# Patient Record
Sex: Male | Born: 1955 | Race: White | Hispanic: No | Marital: Married | Smoking: Never smoker
Health system: Southern US, Community
[De-identification: ages and names within clinical notes are randomized; demographics above are authoritative.]

## PROBLEM LIST (undated history)

## (undated) DIAGNOSIS — F329 Major depressive disorder, single episode, unspecified: Secondary | ICD-10-CM

## (undated) DIAGNOSIS — I10 Essential (primary) hypertension: Secondary | ICD-10-CM

## (undated) DIAGNOSIS — F32A Depression, unspecified: Secondary | ICD-10-CM

## (undated) DIAGNOSIS — F1010 Alcohol abuse, uncomplicated: Secondary | ICD-10-CM

## (undated) HISTORY — PX: BACK SURGERY: SHX140

## (undated) HISTORY — PX: OTHER SURGICAL HISTORY: SHX169

---

## 1997-11-02 ENCOUNTER — Ambulatory Visit (HOSPITAL_COMMUNITY): Admission: RE | Admit: 1997-11-02 | Discharge: 1997-11-02 | Payer: Self-pay | Admitting: Specialist

## 1997-11-24 ENCOUNTER — Ambulatory Visit (HOSPITAL_COMMUNITY): Admission: RE | Admit: 1997-11-24 | Discharge: 1997-11-24 | Payer: Self-pay | Admitting: Specialist

## 1997-12-02 ENCOUNTER — Ambulatory Visit (HOSPITAL_COMMUNITY): Admission: RE | Admit: 1997-12-02 | Discharge: 1997-12-02 | Payer: Self-pay | Admitting: Specialist

## 1998-09-14 ENCOUNTER — Ambulatory Visit (HOSPITAL_COMMUNITY): Admission: RE | Admit: 1998-09-14 | Discharge: 1998-09-14 | Payer: Self-pay | Admitting: Orthopedic Surgery

## 2003-12-30 ENCOUNTER — Ambulatory Visit (HOSPITAL_COMMUNITY): Admission: RE | Admit: 2003-12-30 | Discharge: 2003-12-30 | Payer: Self-pay | Admitting: Gastroenterology

## 2003-12-30 DIAGNOSIS — K222 Esophageal obstruction: Secondary | ICD-10-CM

## 2003-12-30 DIAGNOSIS — K209 Esophagitis, unspecified without bleeding: Secondary | ICD-10-CM | POA: Insufficient documentation

## 2004-02-23 ENCOUNTER — Ambulatory Visit (HOSPITAL_COMMUNITY): Admission: RE | Admit: 2004-02-23 | Discharge: 2004-02-23 | Payer: Self-pay | Admitting: Critical Care Medicine

## 2007-03-23 ENCOUNTER — Ambulatory Visit: Payer: Self-pay | Admitting: Gastroenterology

## 2007-07-01 ENCOUNTER — Encounter: Payer: Self-pay | Admitting: Gastroenterology

## 2007-07-01 DIAGNOSIS — I10 Essential (primary) hypertension: Secondary | ICD-10-CM | POA: Insufficient documentation

## 2007-07-01 DIAGNOSIS — E119 Type 2 diabetes mellitus without complications: Secondary | ICD-10-CM

## 2007-07-01 DIAGNOSIS — E1149 Type 2 diabetes mellitus with other diabetic neurological complication: Secondary | ICD-10-CM | POA: Insufficient documentation

## 2008-02-05 ENCOUNTER — Observation Stay (HOSPITAL_COMMUNITY): Admission: AD | Admit: 2008-02-05 | Discharge: 2008-02-09 | Payer: Self-pay | Admitting: Emergency Medicine

## 2008-02-29 ENCOUNTER — Inpatient Hospital Stay (HOSPITAL_COMMUNITY): Admission: EM | Admit: 2008-02-29 | Discharge: 2008-03-25 | Payer: Self-pay | Admitting: Emergency Medicine

## 2008-03-02 ENCOUNTER — Ambulatory Visit: Payer: Self-pay | Admitting: Internal Medicine

## 2008-03-05 ENCOUNTER — Encounter: Payer: Self-pay | Admitting: Internal Medicine

## 2008-03-09 ENCOUNTER — Ambulatory Visit: Payer: Self-pay | Admitting: Physical Medicine & Rehabilitation

## 2008-04-11 ENCOUNTER — Ambulatory Visit: Payer: Self-pay | Admitting: Gastroenterology

## 2008-04-11 DIAGNOSIS — F329 Major depressive disorder, single episode, unspecified: Secondary | ICD-10-CM

## 2008-04-11 DIAGNOSIS — K7290 Hepatic failure, unspecified without coma: Secondary | ICD-10-CM | POA: Insufficient documentation

## 2008-04-11 DIAGNOSIS — F1021 Alcohol dependence, in remission: Secondary | ICD-10-CM

## 2008-04-11 DIAGNOSIS — K7010 Alcoholic hepatitis without ascites: Secondary | ICD-10-CM

## 2008-04-11 DIAGNOSIS — K7682 Hepatic encephalopathy: Secondary | ICD-10-CM | POA: Insufficient documentation

## 2008-05-07 ENCOUNTER — Encounter (INDEPENDENT_AMBULATORY_CARE_PROVIDER_SITE_OTHER): Payer: Self-pay | Admitting: Emergency Medicine

## 2008-05-07 ENCOUNTER — Emergency Department (HOSPITAL_COMMUNITY): Admission: EM | Admit: 2008-05-07 | Discharge: 2008-05-07 | Payer: Self-pay | Admitting: Emergency Medicine

## 2008-05-09 ENCOUNTER — Ambulatory Visit: Payer: Self-pay | Admitting: Professional

## 2008-05-11 ENCOUNTER — Encounter: Payer: Self-pay | Admitting: Gastroenterology

## 2008-05-17 ENCOUNTER — Telehealth: Payer: Self-pay | Admitting: Gastroenterology

## 2008-05-18 ENCOUNTER — Ambulatory Visit: Payer: Self-pay | Admitting: Gastroenterology

## 2008-05-23 ENCOUNTER — Ambulatory Visit: Payer: Self-pay | Admitting: Professional

## 2008-06-08 ENCOUNTER — Ambulatory Visit: Payer: Self-pay | Admitting: Radiology

## 2008-06-08 ENCOUNTER — Inpatient Hospital Stay (HOSPITAL_COMMUNITY): Admission: AD | Admit: 2008-06-08 | Discharge: 2008-06-13 | Payer: Self-pay | Admitting: Internal Medicine

## 2008-06-08 ENCOUNTER — Encounter: Payer: Self-pay | Admitting: Emergency Medicine

## 2009-02-19 ENCOUNTER — Emergency Department (HOSPITAL_BASED_OUTPATIENT_CLINIC_OR_DEPARTMENT_OTHER): Admission: EM | Admit: 2009-02-19 | Discharge: 2009-02-20 | Payer: Self-pay | Admitting: Emergency Medicine

## 2009-02-20 ENCOUNTER — Ambulatory Visit: Payer: Self-pay | Admitting: Psychiatry

## 2009-02-20 ENCOUNTER — Inpatient Hospital Stay (HOSPITAL_COMMUNITY): Admission: AD | Admit: 2009-02-20 | Discharge: 2009-02-24 | Payer: Self-pay | Admitting: Psychiatry

## 2009-02-26 IMAGING — US US ABDOMEN COMPLETE
1 series · 14 of 25 positions shown · non-contrast
Comparison: 02/05/2008

CLINICAL DATA: Alcoholic hepatitis and renal failure.  Left upper
quadrant pain.  Cirrhosis.

ABDOMEN ULTRASOUND
TECHNIQUE: Complete abdominal ultrasound examination was performed
including evaluation of the liver, gallbladder, bile ducts,
pancreas, kidneys, spleen, IVC, and abdominal aorta.

[Series 1: unknown · 0.30mm/px · 14 of 63 slices shown]
[im 1/63]
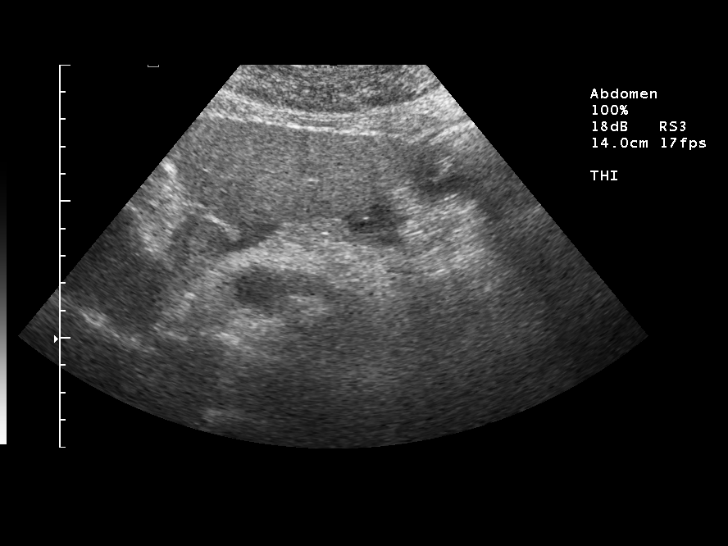
[im 6/63]
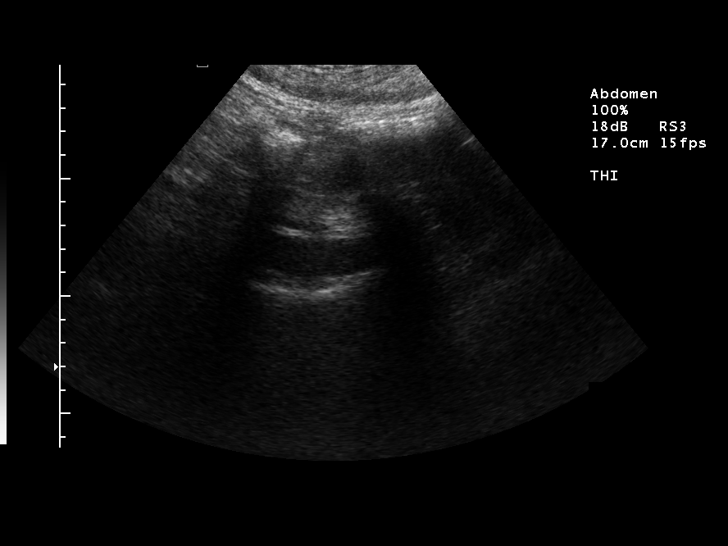
[im 11/63]
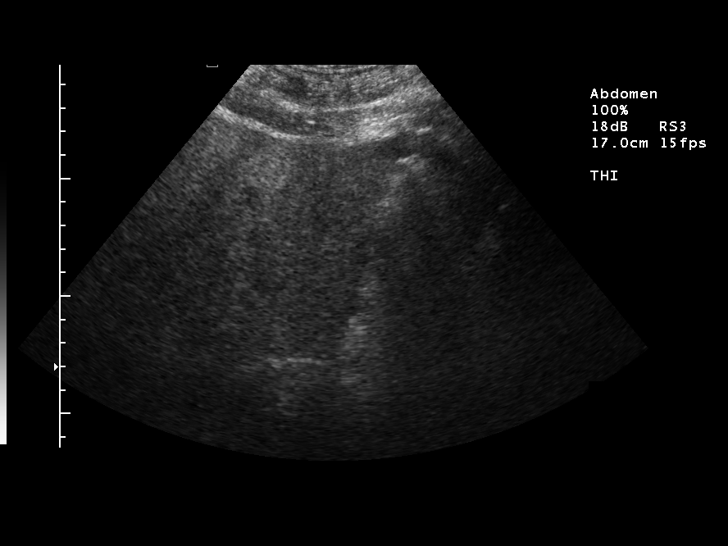
[im 16/63]
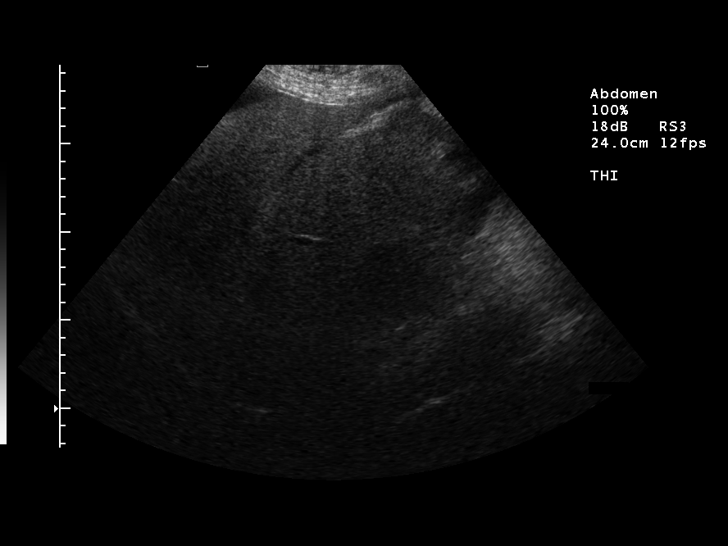
[im 21/63]
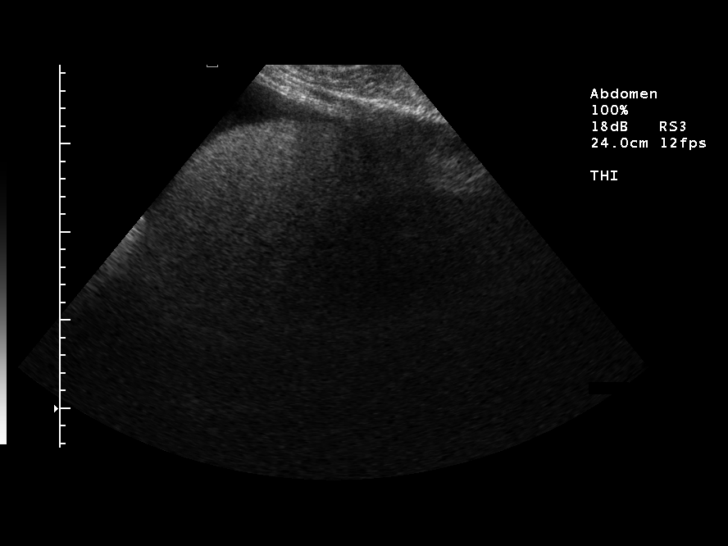
[im 24/63]
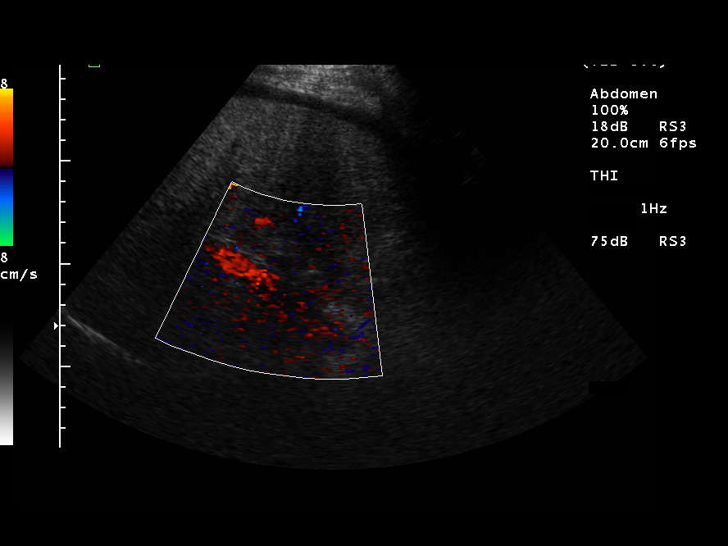
[im 29/63]
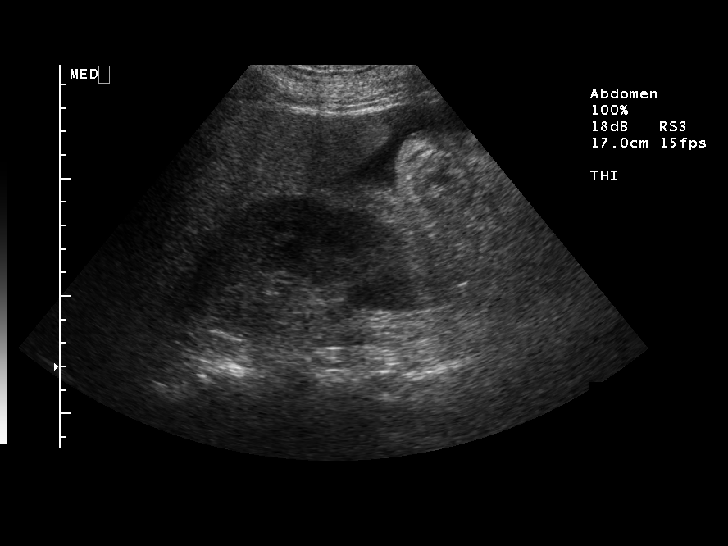
[im 34/63]
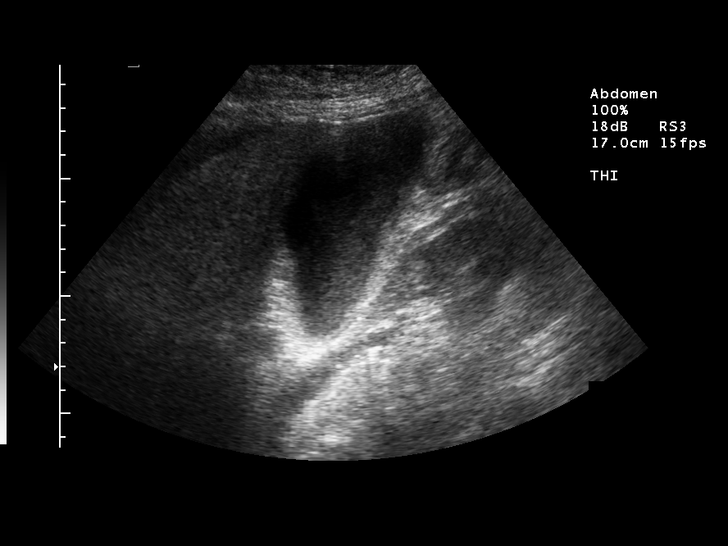
[im 39/63]
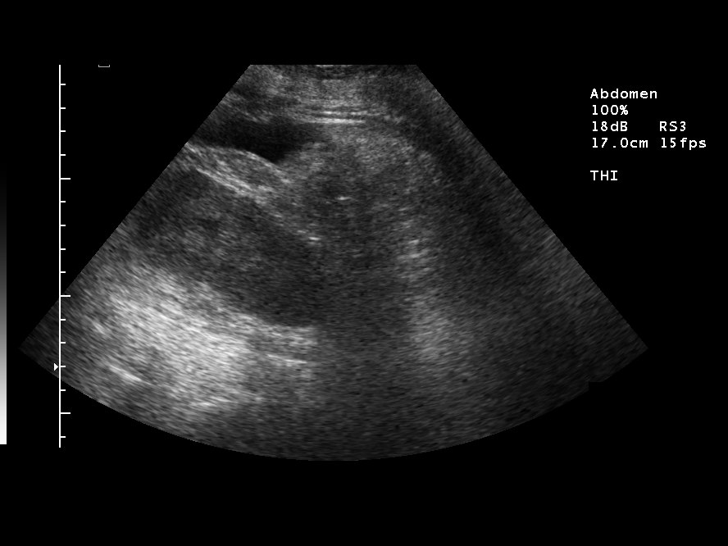
[im 42/63]
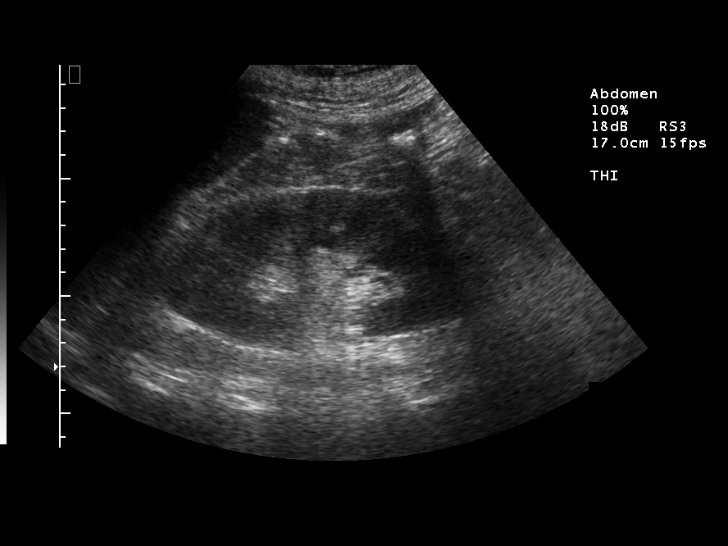
[im 47/63]
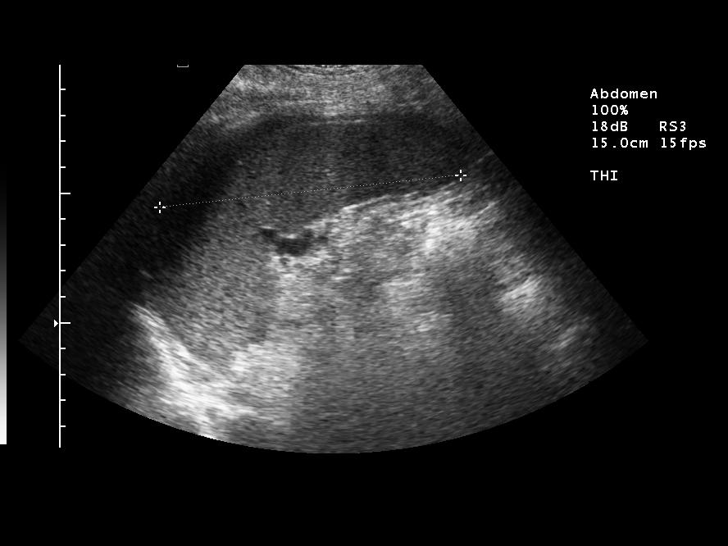
[im 52/63]
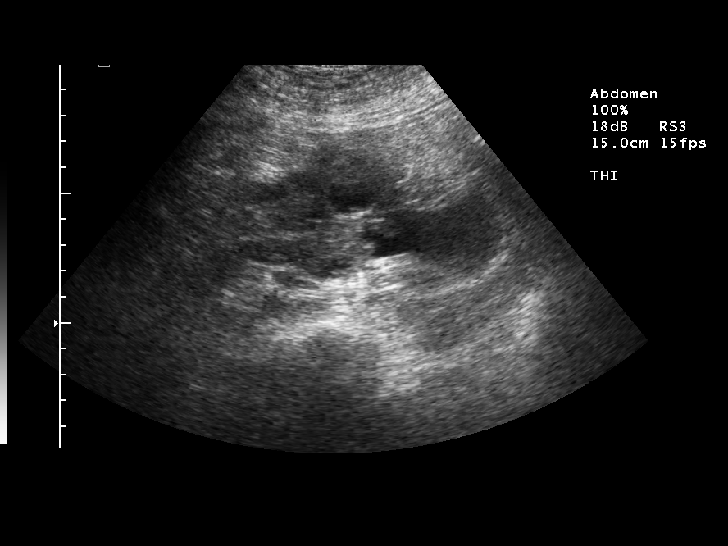
[im 57/63]
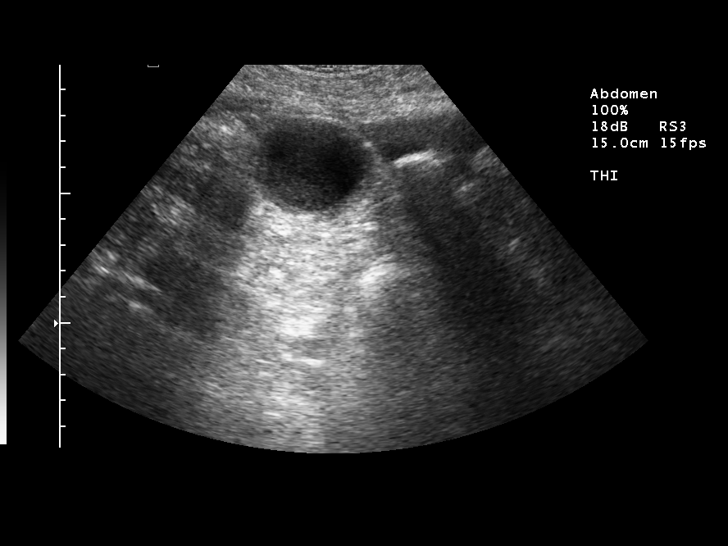
[im 63/63]
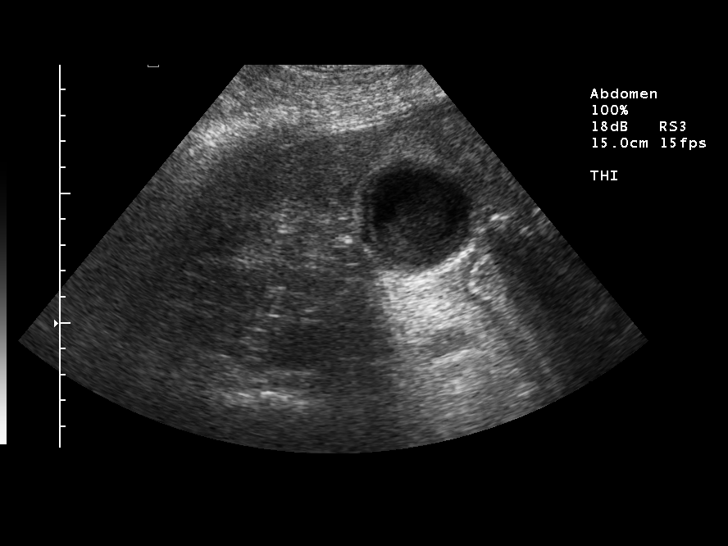

[14 of 25 positions shown; findings below may reference images not displayed]

FINDINGS: Liver is echogenic and is enlarged.  Gallbladder is
filled with nonshadowing echogenic material.  Some pericholecystic
fluid is seen.  Gallbladder wall measures 10 mm.  No sonographic
Murphy's sign.  Extrahepatic bile duct measures 5 mm.

IVC and pancreas are unremarkable.  Spleen measures 11.7 cm.
Kidneys and aorta are unremarkable.  There is ascites.
IMPRESSION: 1.  Fatty enlarged liver.
2.  Gallbladder sludge.  Wall thickening may relate to ascites.

## 2009-03-01 IMAGING — CR DG CHEST 1V PORT
1 series · 1 of 1 positions shown · non-contrast
Comparison: 02/29/2008

CLINICAL DATA: Renal failure.  Rule out pneumonia.

PORTABLE CHEST - 1 VIEW

[view not recorded]
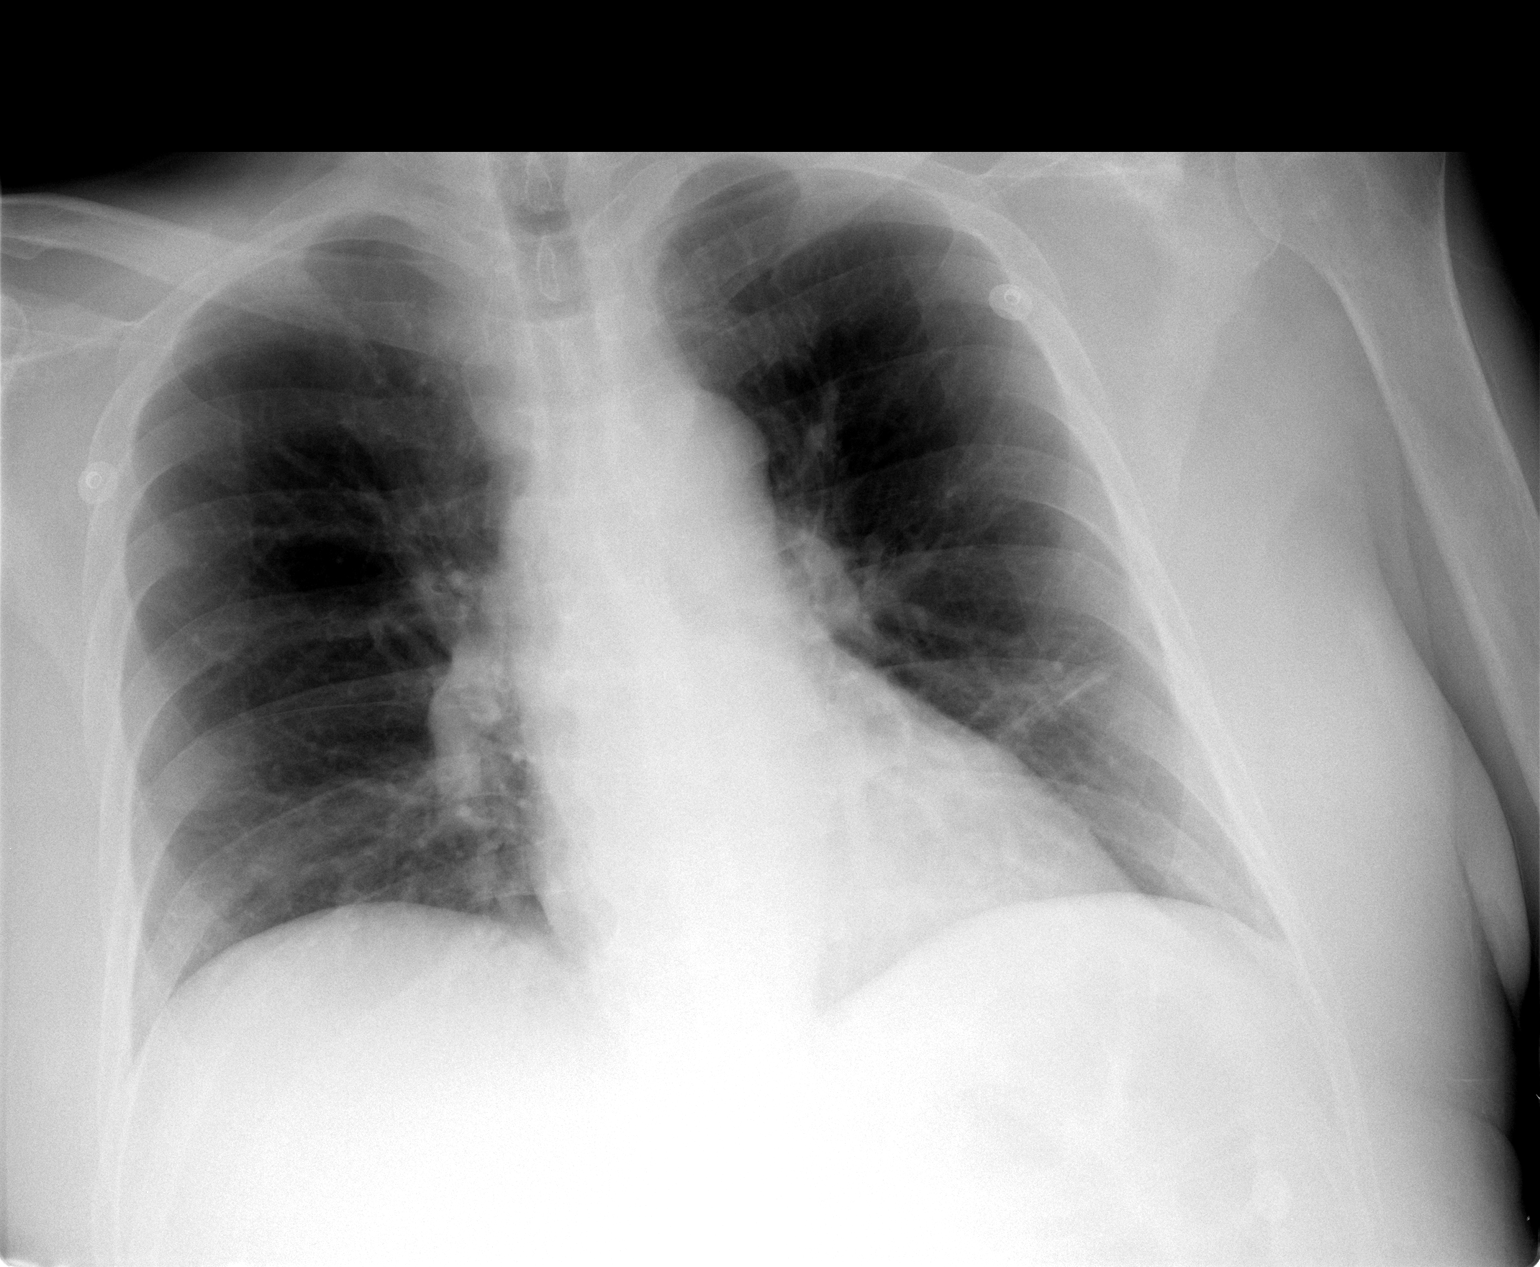

[1 of 1 positions shown; findings below may reference images not displayed]

FINDINGS: There is no edema or infiltrate.  Small linear band of
atelectasis is present in the left base.  There is no effusion.
IMPRESSION: Negative for pneumonia.

## 2009-03-07 IMAGING — US US ABDOMEN LIMITED
1 series · 4 of 4 positions shown · non-contrast
Comparison: none

CLINICAL DATA: Leukocytosis, alcoholic hepatitis, anemia, failure
to thrive; request is made for paracentesis

ABDOMEN ULTRASOUND LIMITED

[Series 1: paracentesis · 0.37mm/px · 4 of 4 slices shown]
[im 1/4]
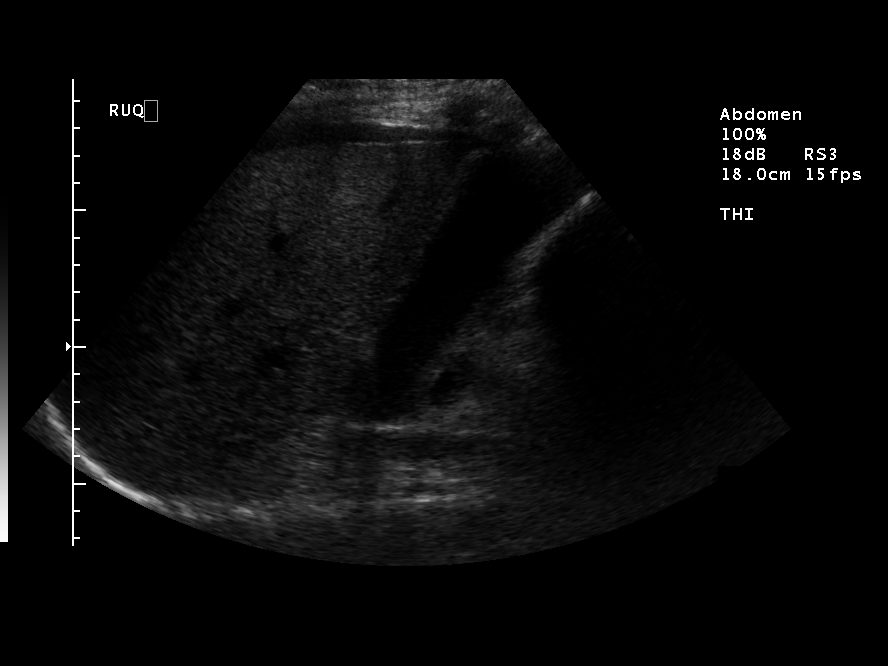
[im 2/4]
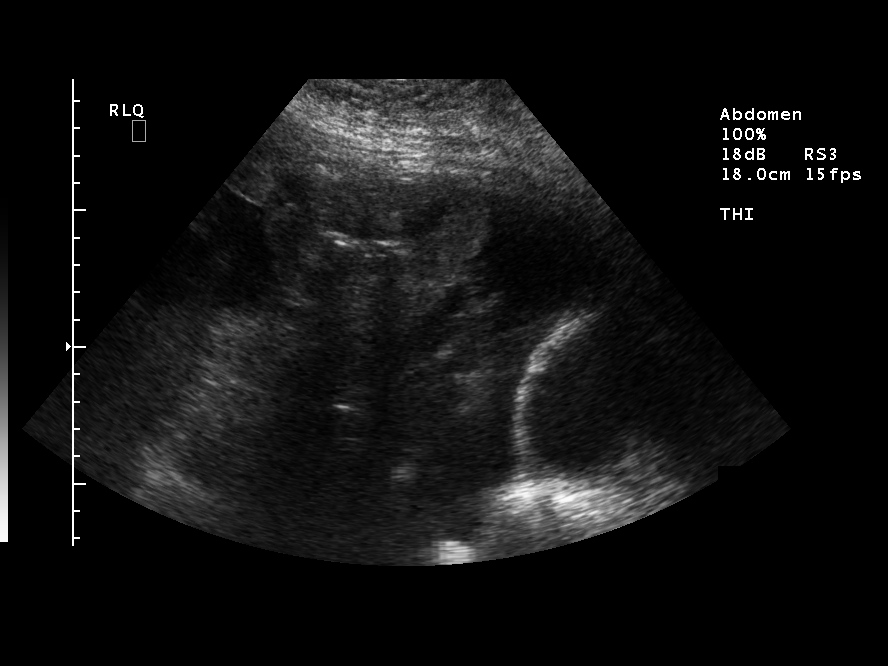
[im 3/4]
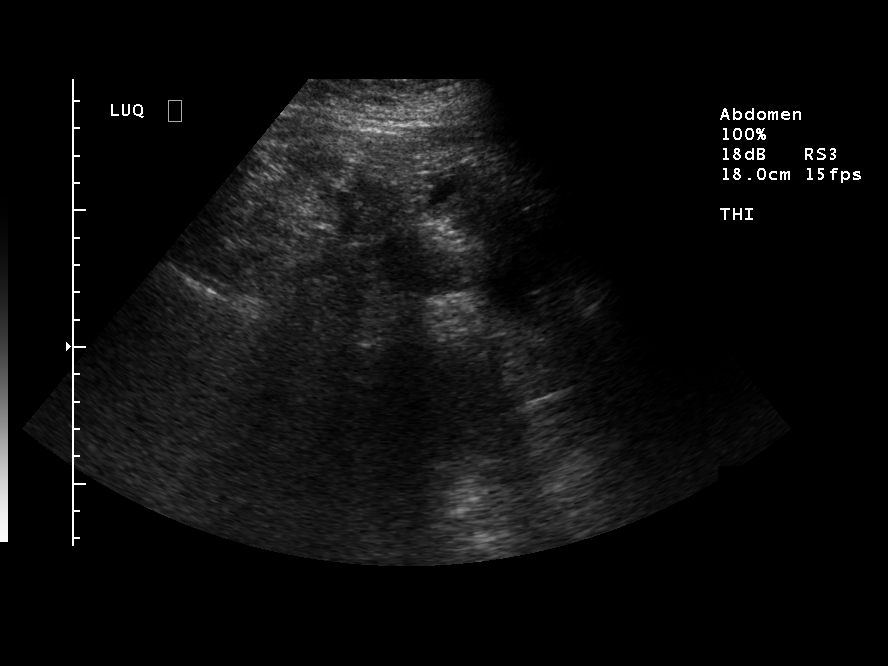
[im 4/4]
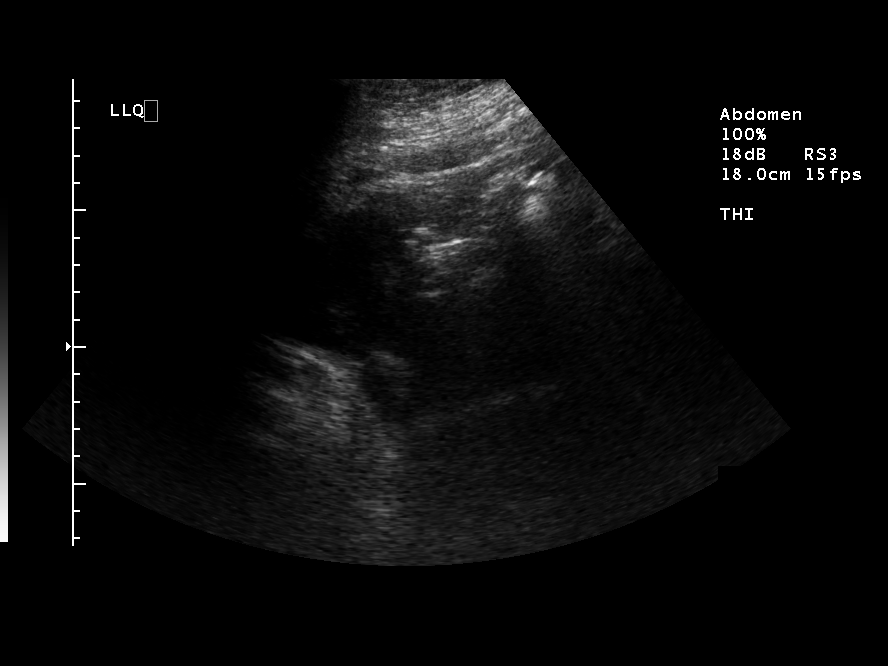

[4 of 4 positions shown; findings below may reference images not displayed]

FINDINGS: Four-quadrant limited abdominal/pelvic ultrasound shows
only a small amount of ascitic fluid. Findings were reviewed with
Dr.Selders ( interventional radiologist) with determination that there
was insufficient fluid for paracentesis at this time without
increased risk of liver or bowel injury.
IMPRESSION: There is only a small amount of ascitic fluid seen in the
peritoneal cavity at this time.  Paracentesis was not performed.
Findings were discussed with Dr. Shinn.

Read by: Almada, Adrean.-MAXEY

## 2009-03-07 IMAGING — CR DG CHEST 1V PORT
1 series · 1 of 1 positions shown · non-contrast
Comparison: Portable chest x-ray of 03/05/2008

CLINICAL DATA: Follow up of pneumonia, urinary tract infection,
renal failure

PORTABLE CHEST - 1 VIEW

[view not recorded]
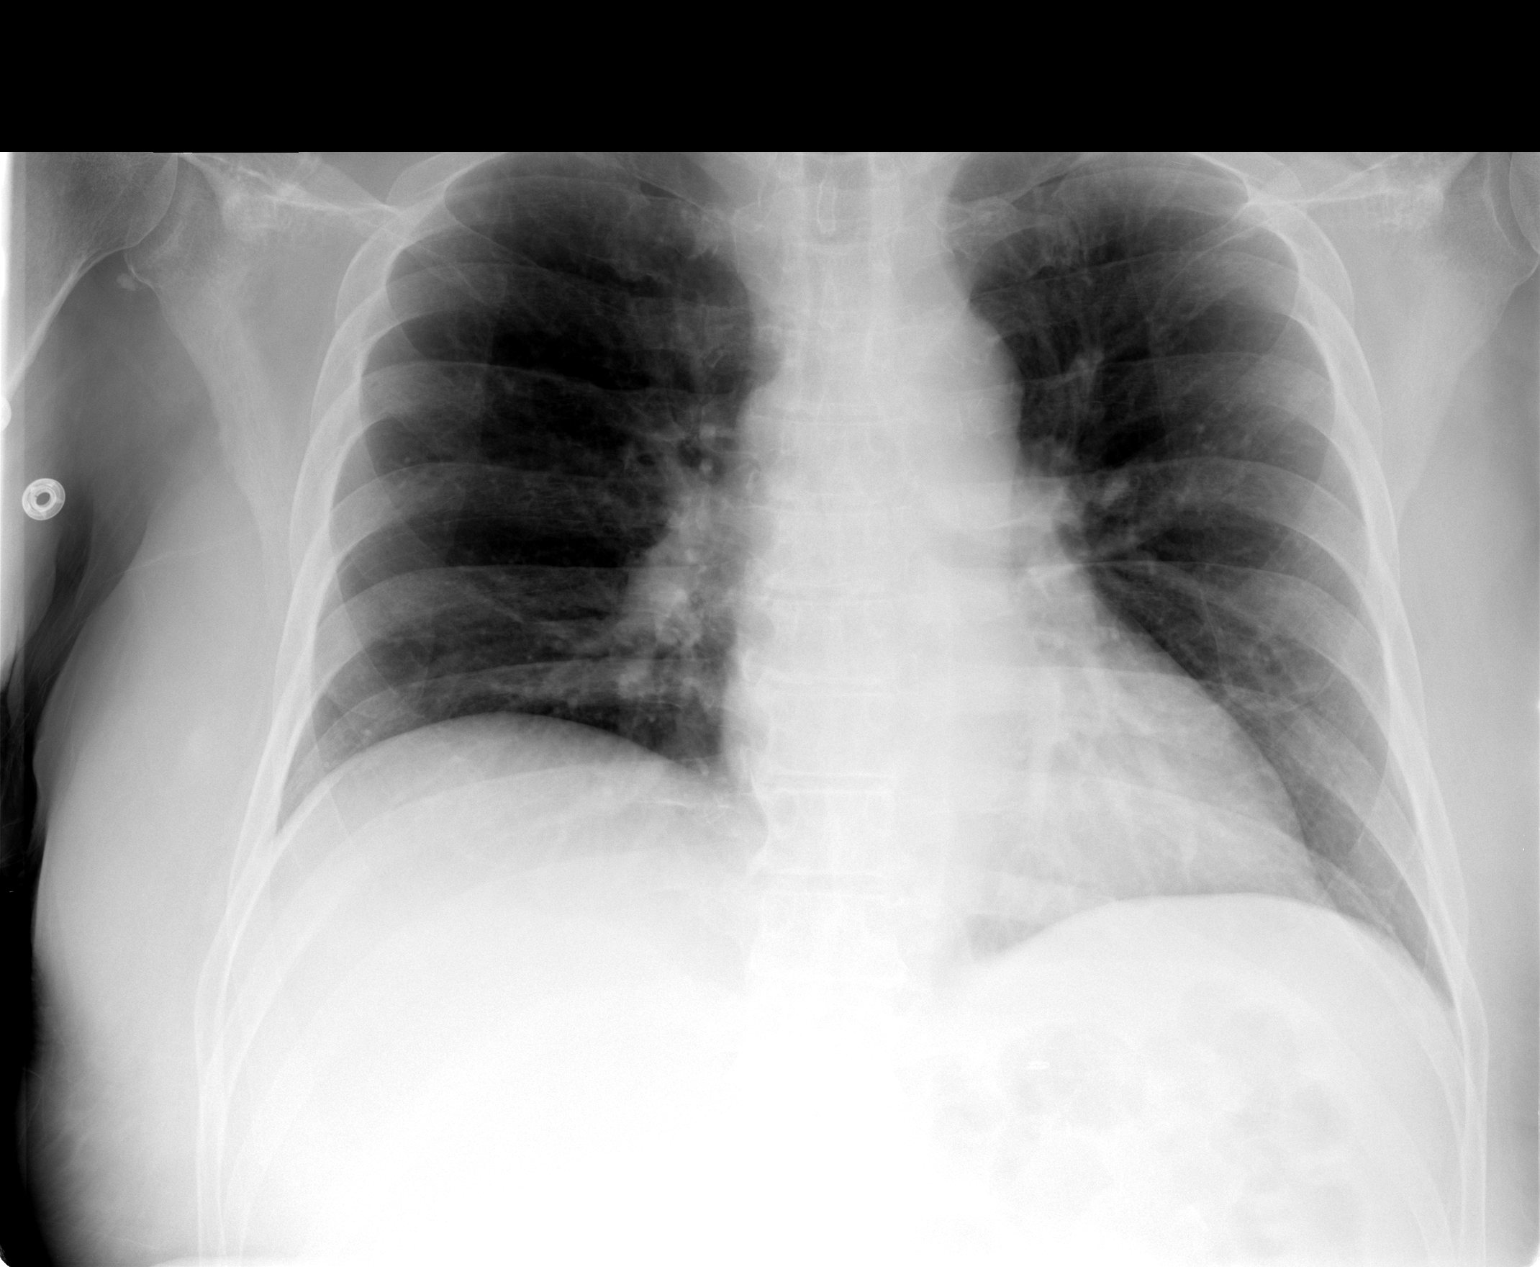

[1 of 1 positions shown; findings below may reference images not displayed]

FINDINGS: The lungs are clear. The heart is within normal limits in
size. No bony abnormality is seen.
IMPRESSION: No active lung disease.

## 2009-06-04 IMAGING — CR DG HIP (WITH OR WITHOUT PELVIS) 2-3V*L*
1 series · 1 of 1 positions shown · non-contrast
Comparison: Left femur same day

Addendum Begins

Additional three views of the pelvis are obtained.  There is
intertrochanteric fracture of the proximal right femur with varus
angulation.
Addendum Ends
CLINICAL DATA: Trauma, fall
LEFT HIP - COMPLETE 2+ VIEW

[w cross table hip left *]
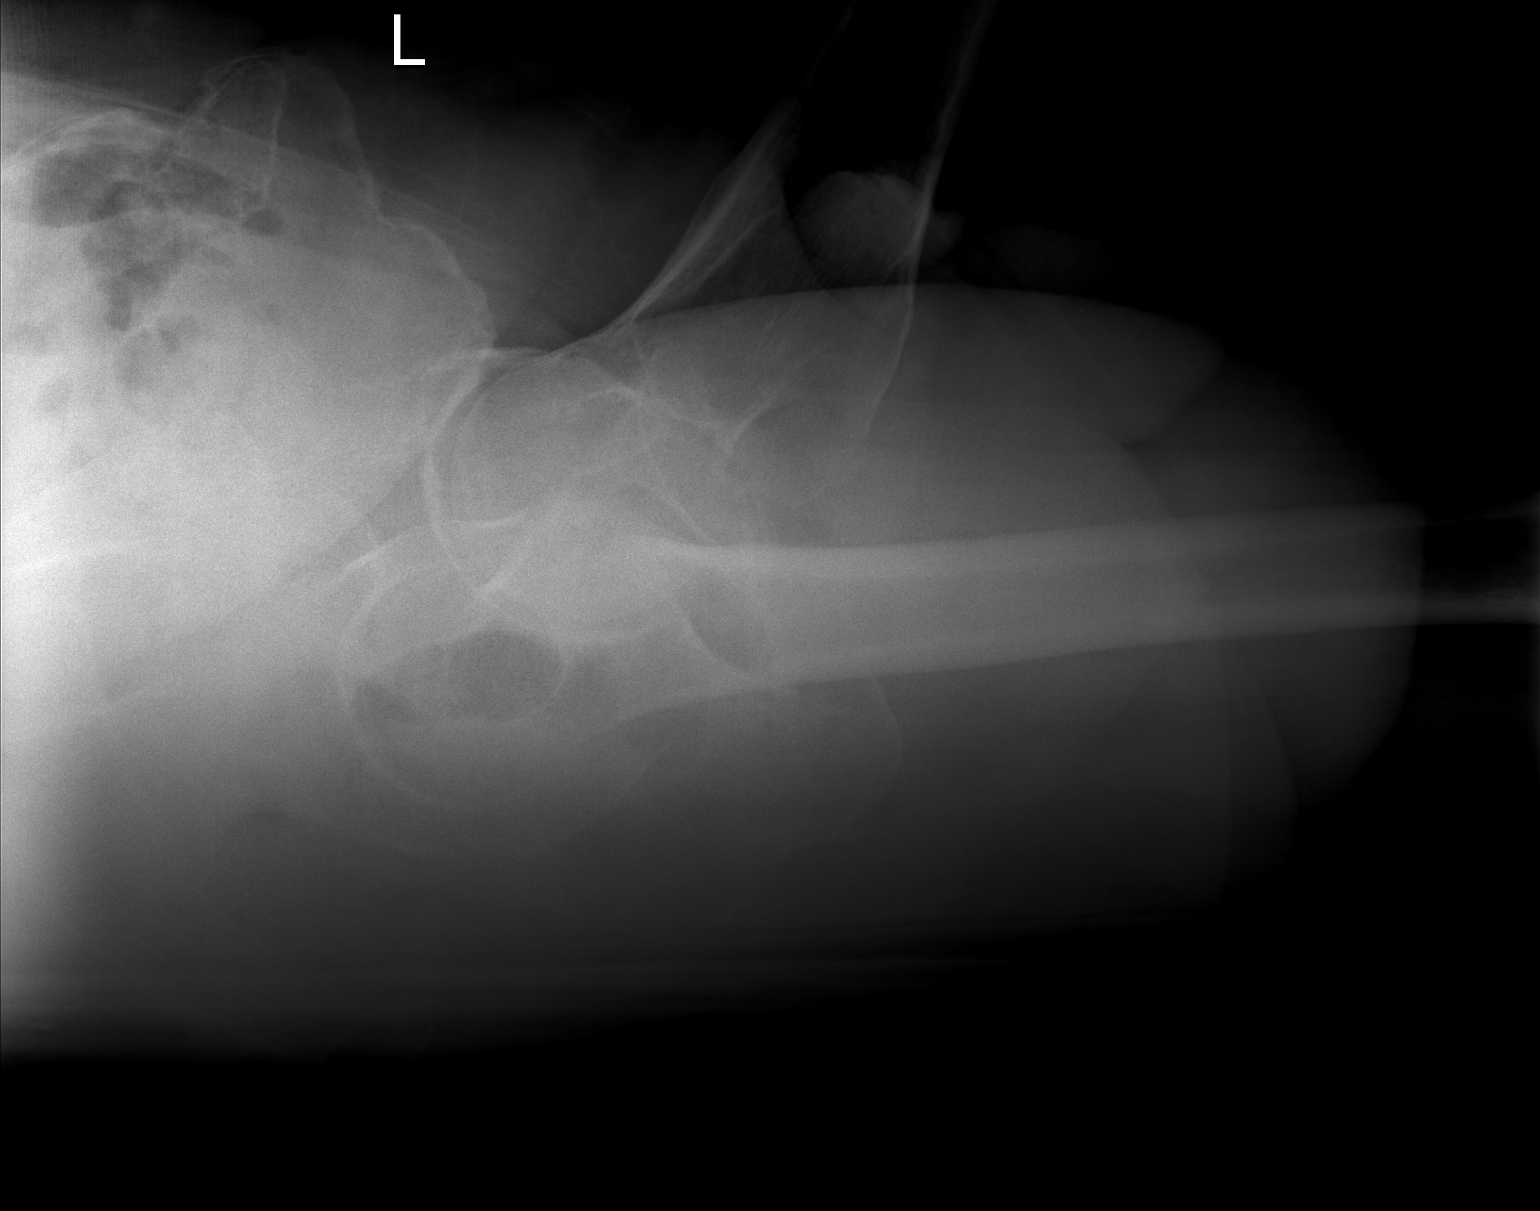

[1 of 1 positions shown; findings below may reference images not displayed]

FINDINGS: The study is markedly limited with only a single cross-
table view provided.  There is impacted fracture of the left
femoral neck.
IMPRESSION: Limited study.  Impacted fracture of the left femoral neck.

## 2009-07-31 ENCOUNTER — Encounter: Admission: RE | Admit: 2009-07-31 | Discharge: 2009-07-31 | Payer: Self-pay | Admitting: Orthopedic Surgery

## 2009-11-02 ENCOUNTER — Ambulatory Visit (HOSPITAL_COMMUNITY): Admission: RE | Admit: 2009-11-02 | Discharge: 2009-11-02 | Payer: Self-pay | Admitting: Surgery

## 2010-07-22 LAB — PROTIME-INR
INR: 1.02 (ref 0.00–1.49)
Prothrombin Time: 13.3 seconds (ref 11.6–15.2)

## 2010-07-22 LAB — CBC
HCT: 40.6 % (ref 39.0–52.0)
RBC: 4.2 MIL/uL — ABNORMAL LOW (ref 4.22–5.81)
RDW: 13.2 % (ref 11.5–15.5)
WBC: 9.4 10*3/uL (ref 4.0–10.5)

## 2010-07-22 LAB — COMPREHENSIVE METABOLIC PANEL
ALT: 27 U/L (ref 0–53)
AST: 31 U/L (ref 0–37)
Albumin: 4.3 g/dL (ref 3.5–5.2)
BUN: 18 mg/dL (ref 6–23)
GFR calc non Af Amer: >60 mL/min (ref >60)
Glucose, Bld: 106 mg/dL — ABNORMAL HIGH (ref 70–99)
Total Protein: 6.9 g/dL (ref 6.0–8.3)

## 2010-07-22 LAB — DIFFERENTIAL
Monocytes Relative: 9 % (ref 3–12)
Neutrophils Relative %: 60 % (ref 43–77)

## 2010-08-09 LAB — DIFFERENTIAL
Basophils Absolute: 0.1 10*3/uL (ref 0.0–0.1)
Basophils Relative: 1 % (ref 0–1)
Eosinophils Absolute: 0.3 10*3/uL (ref 0.0–0.7)
Eosinophils Relative: 4 % (ref 0–5)
Monocytes Absolute: 0.3 10*3/uL (ref 0.1–1.0)

## 2010-08-09 LAB — COMPREHENSIVE METABOLIC PANEL
Alkaline Phosphatase: 101 U/L (ref 39–117)
BUN: 12 mg/dL (ref 6–23)
Calcium: 9.1 mg/dL (ref 8.4–10.5)
Glucose, Bld: 177 mg/dL — ABNORMAL HIGH (ref 70–99)
Total Protein: 7.9 g/dL (ref 6.0–8.3)

## 2010-08-09 LAB — CBC
HCT: 42 % (ref 39.0–52.0)
Hemoglobin: 14.1 g/dL (ref 13.0–17.0)
MCHC: 33.7 g/dL (ref 30.0–36.0)
RDW: 13.6 % (ref 11.5–15.5)

## 2010-08-09 LAB — POCT TOXICOLOGY PANEL

## 2010-08-09 LAB — HEPATIC FUNCTION PANEL
AST: 34 U/L (ref 0–37)
Bilirubin, Direct: 0.2 mg/dL (ref 0.0–0.3)
Total Bilirubin: 1.1 mg/dL (ref 0.3–1.2)

## 2010-08-09 LAB — PROTIME-INR
INR: 0.93 (ref 0.00–1.49)
Prothrombin Time: 12.4 seconds (ref 11.6–15.2)

## 2010-08-09 LAB — SALICYLATE LEVEL: Salicylate Lvl: 1 mg/dL — ABNORMAL LOW (ref 2.8–20.0)

## 2010-08-09 LAB — AMMONIA: Ammonia: 13 umol/L (ref 11–35)

## 2010-08-20 LAB — BASIC METABOLIC PANEL
BUN: 11 mg/dL (ref 6–23)
CO2: 25 mEq/L (ref 19–32)
Chloride: 102 mEq/L (ref 96–112)
Creatinine, Ser: 0.88 mg/dL (ref 0.4–1.5)

## 2010-08-20 LAB — CSF CELL COUNT WITH DIFFERENTIAL
RBC Count, CSF: 0 /mm3
RBC Count, CSF: 76 /mm3 — ABNORMAL HIGH
Tube #: 1
WBC, CSF: 0 /mm3 (ref 0–5)

## 2010-08-20 LAB — CBC
MCHC: 34 g/dL (ref 30.0–36.0)
MCV: 92.9 fL (ref 78.0–100.0)
Platelets: 127 10*3/uL — ABNORMAL LOW (ref 150–400)
RDW: 14.2 % (ref 11.5–15.5)
WBC: 11.3 10*3/uL — ABNORMAL HIGH (ref 4.0–10.5)

## 2010-08-20 LAB — LACTIC ACID, PLASMA: Lactic Acid, Venous: 1.2 mmol/L (ref 0.5–2.2)

## 2010-08-20 LAB — PROTEIN AND GLUCOSE, CSF
Glucose, CSF: 74 mg/dL (ref 43–76)
Total  Protein, CSF: 21 mg/dL (ref 15–45)

## 2010-08-20 LAB — DIFFERENTIAL
Basophils Absolute: 0.1 10*3/uL (ref 0.0–0.1)
Basophils Relative: 1 % (ref 0–1)
Eosinophils Absolute: 0 10*3/uL (ref 0.0–0.7)
Neutrophils Relative %: 77 % (ref 43–77)

## 2010-08-20 LAB — HEPATIC FUNCTION PANEL
Albumin: 2.3 g/dL — ABNORMAL LOW (ref 3.5–5.2)
Total Protein: 5.7 g/dL — ABNORMAL LOW (ref 6.0–8.3)

## 2010-08-20 LAB — CSF CULTURE W GRAM STAIN
Culture: NO GROWTH
Gram Stain: NONE SEEN

## 2010-08-21 LAB — BASIC METABOLIC PANEL
BUN: 11 mg/dL (ref 6–23)
CO2: 24 mEq/L (ref 19–32)
CO2: 25 mEq/L (ref 19–32)
CO2: 25 mEq/L (ref 19–32)
CO2: 25 mEq/L (ref 19–32)
Calcium: 8.5 mg/dL (ref 8.4–10.5)
Calcium: 8.7 mg/dL (ref 8.4–10.5)
Calcium: 8.8 mg/dL (ref 8.4–10.5)
Calcium: 9.9 mg/dL (ref 8.4–10.5)
Chloride: 96 mEq/L (ref 96–112)
Chloride: 98 mEq/L (ref 96–112)
Creatinine, Ser: 0.8 mg/dL (ref 0.4–1.5)
Creatinine, Ser: 0.86 mg/dL (ref 0.4–1.5)
Creatinine, Ser: 0.89 mg/dL (ref 0.4–1.5)
GFR calc Af Amer: >60 mL/min (ref >60)
GFR calc Af Amer: >60 mL/min (ref >60)
GFR calc Af Amer: >60 mL/min (ref >60)
GFR calc non Af Amer: >60 mL/min (ref >60)
GFR calc non Af Amer: >60 mL/min (ref >60)
GFR calc non Af Amer: >60 mL/min (ref >60)
Glucose, Bld: 101 mg/dL — ABNORMAL HIGH (ref 70–99)
Glucose, Bld: 122 mg/dL — ABNORMAL HIGH (ref 70–99)
Sodium: 127 mEq/L — ABNORMAL LOW (ref 135–145)
Sodium: 129 mEq/L — ABNORMAL LOW (ref 135–145)
Sodium: 131 mEq/L — ABNORMAL LOW (ref 135–145)
Sodium: 133 mEq/L — ABNORMAL LOW (ref 135–145)
Sodium: 138 mEq/L (ref 135–145)

## 2010-08-21 LAB — CBC
HCT: 33.5 % — ABNORMAL LOW (ref 39.0–52.0)
Hemoglobin: 10.6 g/dL — ABNORMAL LOW (ref 13.0–17.0)
Hemoglobin: 11.1 g/dL — ABNORMAL LOW (ref 13.0–17.0)
Hemoglobin: 11.4 g/dL — ABNORMAL LOW (ref 13.0–17.0)
Hemoglobin: 11.6 g/dL — ABNORMAL LOW (ref 13.0–17.0)
Hemoglobin: 16.1 g/dL (ref 13.0–17.0)
Hemoglobin: 9.9 g/dL — ABNORMAL LOW (ref 13.0–17.0)
MCHC: 33.8 g/dL (ref 30.0–36.0)
MCHC: 34.1 g/dL (ref 30.0–36.0)
MCHC: 34.1 g/dL (ref 30.0–36.0)
MCHC: 34.4 g/dL (ref 30.0–36.0)
MCV: 91.8 fL (ref 78.0–100.0)
MCV: 91.8 fL (ref 78.0–100.0)
Platelets: 216 10*3/uL (ref 150–400)
RBC: 3.17 MIL/uL — ABNORMAL LOW (ref 4.22–5.81)
RBC: 3.63 MIL/uL — ABNORMAL LOW (ref 4.22–5.81)
RBC: 5.37 MIL/uL (ref 4.22–5.81)
RDW: 13.8 % (ref 11.5–15.5)
RDW: 14.2 % (ref 11.5–15.5)
RDW: 14.2 % (ref 11.5–15.5)
WBC: 10.8 10*3/uL — ABNORMAL HIGH (ref 4.0–10.5)
WBC: 13.8 10*3/uL — ABNORMAL HIGH (ref 4.0–10.5)
WBC: 7.9 10*3/uL (ref 4.0–10.5)

## 2010-08-21 LAB — COMPREHENSIVE METABOLIC PANEL
AST: 28 U/L (ref 0–37)
Albumin: 2.7 g/dL — ABNORMAL LOW (ref 3.5–5.2)
Alkaline Phosphatase: 105 U/L (ref 39–117)
BUN: 6 mg/dL (ref 6–23)
CO2: 27 mEq/L (ref 19–32)
Chloride: 102 mEq/L (ref 96–112)
Chloride: 104 mEq/L (ref 96–112)
Creatinine, Ser: 0.82 mg/dL (ref 0.4–1.5)
GFR calc Af Amer: >60 mL/min (ref >60)
GFR calc non Af Amer: >60 mL/min (ref >60)
Glucose, Bld: 104 mg/dL — ABNORMAL HIGH (ref 70–99)
Potassium: 3.2 mEq/L — ABNORMAL LOW (ref 3.5–5.1)
Potassium: 3.5 mEq/L (ref 3.5–5.1)
Total Bilirubin: 0.6 mg/dL (ref 0.3–1.2)
Total Bilirubin: 0.7 mg/dL (ref 0.3–1.2)

## 2010-08-21 LAB — PROTIME-INR
INR: 1.2 (ref 0.00–1.49)
Prothrombin Time: 15.7 seconds — ABNORMAL HIGH (ref 11.6–15.2)

## 2010-08-21 LAB — DIFFERENTIAL
Basophils Absolute: 0 10*3/uL (ref 0.0–0.1)
Lymphocytes Relative: 12 % (ref 12–46)
Lymphs Abs: 0.9 10*3/uL (ref 0.7–4.0)
Monocytes Absolute: 0.3 10*3/uL (ref 0.1–1.0)
Monocytes Relative: 4 % (ref 3–12)
Neutro Abs: 6.6 10*3/uL (ref 1.7–7.7)

## 2010-08-21 LAB — APTT: aPTT: 32 seconds (ref 24–37)

## 2010-08-21 LAB — URINALYSIS, ROUTINE W REFLEX MICROSCOPIC
Nitrite: NEGATIVE
Protein, ur: NEGATIVE mg/dL
Specific Gravity, Urine: 1.007 (ref 1.005–1.030)
Urobilinogen, UA: 1 mg/dL (ref 0.0–1.0)

## 2010-08-21 LAB — MAGNESIUM: Magnesium: 1.3 mg/dL — ABNORMAL LOW (ref 1.5–2.5)

## 2010-08-21 LAB — AMMONIA: Ammonia: 24 umol/L (ref 11–35)

## 2010-09-18 NOTE — H&P (Signed)
NAME:  [name], [name]                 ACCOUNT NO.:  [id number]   MEDICAL RECORD NO.:  [id number]          PATIENT TYPE:  INP   LOCATION:  3737                         FACILITY:  [facility]   PHYSICIAN:  Lonia Blood, M.D.DATE OF BIRTH:  Aug 13, 1955   DATE OF ADMISSION:  06/08/2008  DATE OF DISCHARGE:                              HISTORY & PHYSICAL   PRIMARY CARE PHYSICIAN:  Dr. Lorenz Coaster.   CHIEF COMPLAINT:  Fall with left femoral neck fracture.   PRESENT ILLNESS:  Mr. [name]. [name] is 55 year old gentleman who was  discharged from the [facility] March 25, 2008 after  a prolonged stay secondary to alcohol-related severe hepatitis and  profound failure to thrive related to the same.  Since that time the  patient vows that he has not had a sip of alcohol.  He states he has  been compliant with all of his medications.  Today he went to retrieve  the mail from the mailbox at the end of his driveway, slipped on the  ice, and fell onto his left side.  He immediately began to experience  severe pain.  Unfortunately, his cell phone slipped out of his pocket  when he fell and slid under his car.  He spent approximately 2 hours on  the ground on the ice pack while he slowly worked his way underneath his  car and was finally able to retrieve his phone and call for help.  He  was transferred [place] [facility] for evaluation where the diagnosis of  fracture was made after an x-ray was obtained.  Orthopedics was  contacted and requested that medical service admit the patient due to  his multiple chronic medical problems.  The patient was transferred to  unit 3700 where he is evaluated.   At the present time the patient is resting comfortably in his hospital  bed.  He has no complaints other than left hip pain.  He vehemently  denies any chest pain, shortness of breath, dyspnea on exertion, fevers,  chills, nausea, vomiting, night sweats, diarrhea.  He states that he has  had no  anginal type symptoms whatsoever and that he has had no  palpitations.   REVIEW OF SYSTEMS:  Comprehensive review of systems unremarkable  exception of positive elements on his present illness.   PAST MEDICAL HISTORY:  1. Extensive history of heavy alcohol abuse.      a.     Prior admission for alcoholic related hepatitis.      b.     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      c.     Complete abstinence from alcohol for 3 months.  2. Depression.  3. Prior history of obesity status post Roux-en-Y gastric bypass with      subsequent loss of 150 pounds.  4. Prior history of diabetes - resolved with weight loss.  5. Prior history of hypertension - resolved with weight loss.   OUTPATIENT MEDICATIONS:  1. Prozac 40 mg daily.  2. Zinc sulfate 220 mg daily.  3. Lactulose 50 mL b.i.d.  4. Metolazone  2.5 mg p.o. as needed on Monday, Wednesday and Friday      for fluid accumulation in legs.  5. Calcium carbonate 500 mg b.i.d.  6. Metanx 1 p.o. daily.  7. Folic acid 1 mg daily.  8. vitamin D 200 international units daily.  9. Thiamine 1 mg daily.  10.Multivitamin n.p.o. daily.  11.Magnesium oxide 400 mg daily.  12.Potassium chloride 20 mEq b.i.d.   ALLERGIES:  No known drug allergies.   FAMILY HISTORY:  Noncontributory.   SOCIAL HISTORY:  The patient is a retired Social research officer, government.  He is married.  He lives in the [place] area.  He does not smoke and as stated above  has not drunk alcohol in 3 months.   DATA REVIEW:  BMET is unremarkable.  CBC is unremarkable.  Coags are  normal with INR 1.1 and a peaked PT of 32.  X-ray of the hip reveals an  impacted fracture the left femoral neck.  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   PHYSICAL EXAMINATION:  VITAL SIGNS:  Temperature 98.4, blood pressure  149/90, heart rate 90, respiratory 18, O2 saturation 99% on room air.  GENERAL:  Well-developed,  well-nourished gentleman in no acute  respiratory distress.  HEENT: Normocephalic, atraumatic.  Pupils equal round gram  accommodation.  Extraocular intact bilaterally.  TMs clear.  NECK:  There is no JVD.  LUNGS:  Clear to auscultation bilaterally without wheezes or rhonchi.  CARDIOVASCULAR:  Regular rate and rhythm with no appreciable murmur,  gallop or rub.  Normal S1 and S2.  ABDOMEN:  Soft bowel sounds present.  There is some redundant cutaneous  tissue but no severe obesity.  There is no appreciable masses.  No  rebound, no ascites.  EXTREMITIES:  With 1+ bilateral lower extremity edema without cyanosis  or clubbing.  NEUROLOGIC:  Oriented x4.  Cranial nerves II-XII intact bilaterally, 5/5  strength bilateral upper extremities and lower extremities are not  tested secondary to the patient's fracture.  There is no Babinski.  The  patient has intact sensation judged throughout.   IMPRESSION AND PLAN:  1. Left femoral neck fracture - the patient has been evaluated by      orthopedic service and they would like to take the patient to the      OR tomorrow afternoon.  The patient has no cardiac symptoms      whatsoever to suggest unstable angina pectoris.  He has no history      of coronary artery disease.  The only factor that affects his      operative mortality will be his potential liver disease.  The      patient does have a history of coagulopathy and reported hepatitis      the only measure I do have available to me now, his coags, are      completely normal.  CMET has not yet been obtained.  I will assess      a CMET as a general measure of the patient's liver function.      However, PT and PTT are perhaps even more accurate measurements and      they are normal at the present time.  It is certainly quite      possible that the patient was suffering with alcoholic hepatitis      and not true cirrhosis, and at the liver function has now      normalized.  It is appreciated that  evaluation the patient's liver      with ultrasound in October 2009 revealed a fatty and enlarged      liver.  There was no evidence of a nodular structure to suggest      formal cirrhosis.  Assuming the patient's coags remain stable      overnight and assuming that his LFTs are actually normal or close      to normal, there is no further workup likely to effect the      patient's perioperative mortality or morbidity.  After discussion      with the patient, and assuming these lab values are not severely      deranged, I would suggest that he is presently optimized for his      necessary semi emergent surgery as scheduled tomorrow.  Should his      coags remain normal, he will need to be anticoagulated      postoperatively as per usual protocol for DVT prophylaxis.  2. History of alcohol abuse with hepatitis - as discussed above.  The      parameters available to me at the present time would suggest the      patient's liver function has actually normalized.  There is no      evidence of an active hepatitis at the present time.  We will check      coags and LFTs as discussed, but do not anticipate that this will      represent a significant issue during hospital stay.  The patient      does remain on lactulose which raises some concern that he may      continue to suffer with decreased liver function, but I will check      an ammonia level to simply evaluate this.  3. Depression - we will continue the patient's Prozac.  4. Prior history of hypertension - will follow the patient blood      pressure during his hospital stay.  It is likely that it is mildly      elevated at the present time simply secondary to pain.      Lonia Blood, M.D.  Electronically Signed     JTM/MEDQ  D:  06/08/2008  T:  06/08/2008  Job:  [id number]   cc:   Reuben Likes, M.D.

## 2010-09-18 NOTE — H&P (Signed)
NAME:  [name], [name]                 ACCOUNT NO.:  [id number]   MEDICAL RECORD NO.:  [id number]          PATIENT TYPE:  INP   LOCATION:  1424                         FACILITY:  [facility]   PHYSICIAN:  [name], M.D. DATE OF BIRTH:  12-14-1955   DATE OF ADMISSION:  02/29/2008  DATE OF DISCHARGE:                              HISTORY & PHYSICAL   PRIMARY CARE PHYSICIAN:  [name], M.D.   CHIEF COMPLAINT:  Progressive weakness and diarrhea.   HISTORY OF THE PRESENT ILLNESS:  This is a 55 year old Caucasian  gentleman who was discharged from this facility about 2-3 weeks ago  after treatment for alcoholic hepatitis at that time the patient refused  further lactulose and also was refused to go into alcohol rehab.  He was  discharged in stable condition on vitamins, potassium and magnesium  supplements, and was to continue with his Prozac.  The patient now  reports that for the past week he is been anorexic, unable to eat, and  he has been having diarrhea about every 45 minutes.  He denies any blood  in the stool, but because of progressive weakness the patient came into  the emergency room to be evaluated.   I spoke to his wife by phone who also notes that apart from the weakness  he is also having difficulty breathing with a lot of mucous production,  yellow diarrhea, abdominal pain, unable to walk, and just appears to be  going down.  She feels that the patient is now ready to cooperate with  his treatments.   PAST MEDICAL HISTORY:  1. Alcoholic hepatitis.  2. Chronic alcohol abuse.  3. Questionable cirrhosis of the liver.   MEDICATIONS:  The patient's medications as of discharge included:  1. folic acid 1 mg daily.  2. Thiamine 100 mg daily.  3. Potassium chloride 20 mEq daily.  4. Zinc sulfate 220 mg daily.  5. Magnesium oxide 100 mg three times daily with meals.  6. Prozac 40 mg daily.  7. Calcium 500 mg twice daily.  8. Multivitamin 1 daily.   ALLERGIES:  No  known drug allergies.   SOCIAL HISTORY:  The patient denies tobacco or illicit drug use.  He is  a retired Production designer, theatre/television/film at [name].  He has been drinking a little  alcohol since discharge.   FAMILY HISTORY:  The patient denies any history of addictions, heart  disease, and liver or kidney disease in any family members.   REVIEW OF SYSTEMS:  On the review of systems, other than noted above,  the 10-point review of systems is unremarkable.   PHYSICAL EXAMINATION:  GENERAL APPEARANCE:  On physical exam this is a  middle-aged Caucasian gentleman reclining in the stretcher, deeply  jaundiced and ill-looking.  VITAL SIGNS:  The patient's vital signs show his temperature is 98.0,  pulse is 115, respirations are 16, blood pressure is 118/76, and  saturating at 99% on room air.  HEENT:  Pupils are round and equal.  Mucous membranes are pink.  He is  deeply icteric.  Dehydrated.  NECK:  No cervical  lymphadenopathy or thyromegaly.  CHEST:  The patient's chest is clear to auscultation bilaterally.  HEART:  Cardiovascular system reveals regular rhythm without murmur.  ABDOMEN:  The patient's abdomen is obese and mildly distended, but soft.  There is bilateral flank dullness.  EXTREMITIES:  The patient's extremities are without edema.  He has 3+  bounding pulses bilaterally.  NEUROLOGIC EXAMINATION:  The patient's central nervous system shows  cranial nerves II-XII are grossly intact.  He has no focal neurological  signs, but he does have asterixis.   LABORATORY DATA:  The patient's Halloran count is 36,000, hemoglobin is  9.5, MCV is 105, platelets are 182,000, and he has 94% neutrophils and  large giant platelets seen.  His sodium is low at 116, his potassium is  3.4, chloride is 76, CO2 is 21, glucose is 116, BUN is 94, and  creatinine 7.56.  His total bilirubin is 12.4, his alk phos is elevated  at 404, his AST is 138, and ALT 84.  Of note, his AST and ALT are lower  than they were at  discharge; however, his alk phos is markedly  increased.  His ammonia level is normal at 34.  His lipase is normal at  26.  Urinalysis shows trace ketones, a large amount of blood, a large  amount of leukocyte esterase and he is on microscopy he has Civello cells  too numerous to count and many bacteria.  The patient's discriminant  function is calculated to be greater than 76.  The patient's x-ray shows  no acute cardiopulmonary disease.   ASSESSMENT:  1. Alcoholic hepatitis.  2. Acute renal failure.  3. Macrocytic anemia.  4. Continued alcohol abuse.  5. Urinary tract infection.  6. Failure to thrive.   PLAN:  1. We will admit this gentleman for IV fluids, IV antibiotics and      steroids.  2. We will consult the gastroenterology service, Dr. Marzetta [name] service      and [name] for assistance with management.      [name], M.D.  Electronically Signed     LC/MEDQ  D:  02/29/2008  T:  03/01/2008  Job:  [id number]   cc:   [name], M.D.  Fax: [phone]   [name] Hair. Arlyce Dice, MD,FACG  [address]  [place]  [place] [address]   [name]

## 2010-09-18 NOTE — Group Therapy Note (Signed)
NAME:  [name], [name]                 ACCOUNT NO.:  [id number]   MEDICAL RECORD NO.:  [id number]          PATIENT TYPE:  INP   LOCATION:  1513                         FACILITY:  [facility]   PHYSICIAN:  Theodosia Paling, MD    DATE OF BIRTH:  1955/08/07                                 PROGRESS NOTE   DATE OF DISCHARGE:  To be determined by the discharging physician.   For further detailed discharge summary, please refer to the excellent  discharge summary dictated by Dr. Lawrence Marseilles. [name] on March 08, 2008.  In the interim the following issues have transpired.  1. Leukocytosis.  The patient had leukocytosis.  His blood culture,      urine culture was negative.  He had a urinary tract infection when      he came to the hospital.  Currently, the patient is presumptively      on Rocephin and his leukocytosis has resolved.  The most likely      cause otherwise of the leukocytosis could be prednisone, which he      was put on for alcoholic hepatitis, which was stopped by me      considering he has peptic ulcer disease and not very strong data      and literature that supports the use of steroids in alcoholic      hepatitis.  Along with that, prednisone is not very well      metabolized and prednisolone should be the treatment choice instead      of prednisone if the need arises.  2. The next issue is alcoholic hepatitis.  The patient had transient      mild transaminitis which actually resolved.  His MELD score at this      time is 17, which is not very poor considering the best MELD score      is 6 and the worst is 40.  GI was reconsulted.  His family had a      lot of questions regarding his prognosis.  We saw the patient      yesterday and recommended the current management.  He had an      endoscopy done on March 05, 2008 which showed peptic ulcer      disease and no varices.  The patient did not have decompensation in      any form and he had minimal ascites on ultrasound, no active  GI      bleed or encephalopathy.  He will be following up with [facility]      well as [facility] in 4-6 weeks' time after discharge.  3. The next issue is acute renal failure.  When the patient arrived to      the hospital, he had severe acute renal failure, however, the      patient responded to fluids and his creatinine has been stable      since then.  4. Malnutrition.  The patient is having better feedings now than      yesterday.  5. Lethargy and generalized deconditioning.  Physical therapy has  evaluated the patient.  The patient is at this time getting      evaluated for CIR inpatient rehab.   DISPOSITION:  The patient is awaiting CIR placement at this time.   PROCEDURES PERFORMED SINCE LAST DISCHARGE SUMMARY:  None.   IMAGING PERFORMED SINCE LAST DISCHARGE SUMMARY:  Include an ultrasound of the abdomen, which showed minimal ascites.  Chest x-ray on March 11, 2008 which showed no active lung disease.   CONSULTATIONS PERFORMED:  [facility] reevaluated the patient yesterday.   TOTAL TIME SPENT:  25 minutes.      Theodosia Paling, MD  Electronically Signed     NP/MEDQ  D:  03/15/2008  T:  03/15/2008  Job:  [id number]

## 2010-09-18 NOTE — H&P (Signed)
NAME:  [name], [name]                 ACCOUNT NO.:  [id number]   MEDICAL RECORD NO.:  [id number]          PATIENT TYPE:  INP   LOCATION:  1416                         FACILITY[facility]   PHYSICIAN:  Altha Harm, MDDATE OF BIRTH:  03-31-1956   DATE OF ADMISSION:  02/05/2008  DATE OF DISCHARGE:                              HISTORY & PHYSICAL   CHIEF COMPLAINT:  Weakness, vertigo x9 months, jaundice x 3 days.   HISTORY OF PRESENT ILLNESS:  [name] is an unfortunate gentleman with  a history of alcohol use who presented to his primary care physician's  office for the first time today and was noted to be jaundiced which is  an acute problem for him.  The patient also reports gradual decline in  his function including weakness and vertigo.  He states that the  weakness has been occurring over the last 9 months.  The vertigo has  been within the last week.  The patient has become jaundiced in the last  48 hours with dizziness upon standing.  He states his urine has been  dark yellow but not coke-like in nature.  His significant other is with  him.  He reports that he has had problems with memory loss and  confusion.  The patient has no cough, no fever, no chills, no nausea,  vomiting or diarrhea.  His oral intake has been very poor.  The patient  has been drinking large amounts of alcohol on a daily basis with his  alcohol being about 3-4 liters of wine a day.  His last drink being this  morning of admission which was Mike's lemonade.  The patient is sent  over from his primary care physician's office for further evaluation.   PAST MEDICAL HISTORY:  Significant for the following:  1. Alcohol dependence.  2. Depression.  3. 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   FAMILY HISTORY:  Is noncontributory.   SOCIAL HISTORY:  The patient denies any tobacco or drug use.  However,  he does admit to alcohol use 3-4 liters of  wine per day.  The patient  has had delirium tremens in the past.  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   CURRENT MEDICATIONS:  Include Prozac 40 mg p.o. daily and assorted  vitamins.   ALLERGIES:  No known drug allergies.   PRIMARY CARE PHYSICIAN:  [name], M.D.   REVIEW OF SYSTEMS:  Fourteen systems were reviewed and all systems were  negative except as noted in the HPI.   Currently there are no labs available for this patient.   PHYSICAL EXAMINATION:  VITAL SIGNS:  Temperature is 97.9, heart rate 89,  blood pressure 122/89, respiratory rate 20, O2 sats 96% on room air.  GENERAL:  The patient is emaciated and weak appearing.  He is jaundiced  in appearance with icterus present.  HEENT:  He is normocephalic, atraumatic.  Pupils equally round and  reactive to light and accommodation.  Extraocular movements are intact.  There is conjunctival pallor and icterus present.  Oropharynx is tacky.  There is no petechiae noted.  He has no exudate, erythema or lesions  noted in the oropharynx.  NECK:  Trachea is midline.  No masses, no thyromegaly, no JVD, no  carotid bruit.  RESPIRATORY:  The patient has a kyphotic posture.  He has no wheezing or  rhonchi noted.  No rales.  Clear to auscultation.  No increased vocal  fremitus.  CARDIOVASCULAR:  He is tachycardic.  There are no murmurs, rubs or  gallops noted.  He has normal S1-S2.  PMI is nondisplaced.  No heaves or  thrills on palpation.  ABDOMEN:  Abdomen is soft, obese, nontender, nondistended.  He does have  hepatomegaly noted.  No splenomegaly is noted.  Madaline Savage space is within  normal limits.  MUSCULOSKELETAL:  He has no warmth, swelling or erythema around the  joints.  He has no CVA tenderness and no spinal tenderness noted.  LYMPH NODE SURVEY:  He has no cervical, axillary or inguinal  lymphadenopathy noted.  NEUROLOGICAL:  The patient has some ataxic gait.  His strength is  diminished equally in bilateral  lower and bilateral upper extremities  and is about 4-/5 in bilateral upper and lower extremities.  DTRs are 2+  bilaterally upper and lower extremities.  Sensation is intact to light  touch and proprioception.  PSYCHIATRIC:  The patient is alert and oriented to person, time and  place.  He has some impairment of cognition.  His insight is poor.  He  has loss of recent and remote recall.  The patient does not demonstrate  any evidence of confabulation at this time.  However, please note that  the conversation has been very short.   ASSESSMENT AND PLAN:  This is a patient who presents with jaundice  likely with some degree of liver failure.  We will check his liver  function tests including a PT/PTT, cholesterol; we will also check his  liver enzymes to assess for any destruction to the liver and also for  bilirubin levels as evidenced by the jaundice.  The patient will also  have a CBC to check for his hemoglobin levels.  I suspect that they will  be low but will also check RBC, folate and serum B12.  The patient also  had an ammonia level checked and stools for guaiac.  We will check a  reticulocyte count in addition to a CBC to effectively assess the  kinetics of any anemia that he may be having.  The patient will be  started on IV fluids for hydration and have vitamins including thiamine  and folate present.  Dependent upon the ammonia level will determine how  the patient is started on laxatives.  The patient will be resumed on his  Prozac.  At this time I have discussed with the patient his desire for  detoxification and the patient is in a contemplative state.  His family  members all want the patient to be detoxed, however, the patient himself  is not expressing a desire for this at this time.  The patient has had  his last drink today and we will watch the patient closely for impending  DTs.  At the first sign of this the patient will be started on  benzodiazepines.  However, he  will not be started on benzodiazepines  prophylactically as this is an effort to detoxify the patient.  Further  decisions will be made upon receipt of the initial  labs and the  patient's response to therapy.      Altha Harm, MD  Electronically Signed     MAM/MEDQ  D:  02/07/2008  T:  02/07/2008  Job:  [id number]   cc:   [name], M.D.  Fax: [phone]

## 2010-09-18 NOTE — Discharge Summary (Signed)
NAME:  [name], [name]                 ACCOUNT NO.:  [id number]   MEDICAL RECORD NO.:  [id number]          PATIENT TYPE:  INP   LOCATION:  1416                         FACILITY:  [facility]   PHYSICIAN:  Altha Harm, MDDATE OF BIRTH:  [birth year]   DATE OF ADMISSION:  02/05/2008  DATE OF DISCHARGE:  02/09/2008                               DISCHARGE SUMMARY   DISCHARGE DISPOSITION:  Home.   FINAL DISCHARGE DIAGNOSES:  1. Alcoholic hepatitis.  2. Alcohol dependency.  3. Hyperbilirubinemia.  4. Transaminitis.  5. Hypomagnesemia, resolved.  6. Hypokalemia, resolved.  7. Depression.  8. 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   DISCHARGE MEDICATIONS:  1. Folate 1 mg p.o. daily.  2. Thiamine 100 mg p.o. daily.  3. Potassium chloride 20 mEq p.o. daily.  4. Zinc sulfate 220 mg p.o. daily.  5. Magnesium oxide 400 mg p.o. t.i.d. with meals.  6. Prozac 40 mg p.o. daily.  7. Calcium 500 mg p.o. b.i.d.  8. Multivitamin 1 tab p.o. daily.   CONSULTATIONS:  None.   PROCEDURE:  None.   DIAGNOSTICS:  Abdominal ultrasound which shows hepatomegaly with  nonspecific heterogenous hepatic echotexture.  No focal hepatic lesions.  No evidence of biliary dilatation.  Pancreas is not well visualized  secondary to overlying bowel gas.   CODE STATUS:  Full code.   ALLERGIES:  NO KNOWN DRUG ALLERGIES.   PRIMARY CARE PHYSICIAN:  Reuben Likes, M.D., [facility]   GASTROENTEROLOGIST:  Barbette Hair. Arlyce Dice, MD.   PRESENTING COMPLAINTS:  Weakness and vertigo x9 months.   HISTORY OF PRESENT ILLNESS:  Please see the H and P dictated by Dr.  [name] from admission February 05, 2008 for details of the HPI.   HOSPITAL COURSE:  The patient upon admission to the hospital was found  to be dehydrated.  He is started on aggressive IV fluids resuscitation.  The patient was also found to have elevated liver enzymes and elevated  bilirubin.  We felt this was all secondary to his  alcoholic use.  The  patient has had no escalation in his transaminases during this  hospitalization, but no resolution in them either.  Ultrasound did not  reveal any abnormalities in the bile duct.  The patient had marginal  increase in his ammonia of 3.2.  He was treated acutely with lactulose.  However, the patient has refused to take any more lactulose.  The  patient had no hypertension.  The patient did have a gait instability  and physical therapy recommends ongoing therapy for the patient.  More  importantly, the patient has had a long discussion about entering into  an alcohol rehab program, but he has flatly refused to do and  demonstrates no interest in doing it at this time.  The patient  understands the risks of continued drinking up to and including death.  The patient showed no signs of alcohol withdrawal during his  hospitalization and thus was not started on any benzodiazepines as  alcohol withdrawal is a symptom triggered treatment.  The patient was  given several vitamins, electrolyte replacement  and supplementation.  I  have had a long discussion with the patient in the presence of his wife.  He is refusing any therapy at this time.  This has been communicated to  his primary care physician, Dr. Lorenz Coaster.  At this time, I am discharging  the patient to home and I would recommend that he follows up with Dr.  Arlyce Dice of the gastroenterology office for any further treatment of his  alcoholic hepatitis.  If this patient continues to drink and has further  derangement in his liver enzymes, and any derangement in his coagulation  profile, he will likely need steroids sometime in the not so distant  future if he continues to drink.   LABORATORY DATA:  Currently, his INR is 1.5 with a PT of 19.1 which does  not support the need for steroids at this time.  His liver enzymes at  the time of discharge were as follows:  Bilirubin 9.4, alkaline  phosphatase 188, SGOT 230, SGPT 207,  total protein 4.9, albumin 2.1,  calcium 7.9, ammonia level 38.   The patient also has an anemia which I suspect stems from a  multifactorial etiology including the fact that he has had a Roux-en-Y  gastric bypass in the past with decreased absorption in addition due to  the fact that the patient has poor nutrition and also has the chronic  disease of alcoholism.  The patient was hemodynamically stable and thus  his hemoglobin was not replaced on this admission.  The patient had iron  studies done and his serum iron was found to be elevated at 139.  His  ferritin was markedly increased at 6,962 which supports an anemia of  chronic disease.  His RBC folate was 748 and his vitamin B-12 level was  1281.   DIET:  In terms of dietary restrictions, the patient is on no  restrictions at this time.  Encouraged to take Ensure 1 can twice a day  for nutrition supplementation.   FOLLOW UP:  The patient is to follow up with Dr. Lorenz Coaster as discussed in  1 week.  I will defer to Dr. Lorenz Coaster to make a referral to  gastroenterology as he sees necessary.      Altha Harm, MD  Electronically Signed     MAM/MEDQ  D:  02/09/2008  T:  02/09/2008  Job:  [id number]   cc:   Reuben Likes, M.D.  Fax: [phone]   Barbette Hair. Arlyce Dice, MD,FACG  [address]  [place]  [place] [address]

## 2010-09-18 NOTE — Consult Note (Signed)
NAME:  [name], [name]                 ACCOUNT NO.:  [id number]   MEDICAL RECORD NO.:  [id number]          PATIENT TYPE:  INP   LOCATION:  1424                         FACILITY:  [facility]   PHYSICIAN:  Cynthia B. Eliott Nine, M.D.DATE OF BIRTH:  01-28-1956   DATE OF CONSULTATION:  03/01/2008  DATE OF DISCHARGE:                                 CONSULTATION   We are asked to see this patient because of acute renal failure. He is a  55 year old [place] male with a background of alcohol abuse, depression,  and a history of gastric bypass. He was just hospitalized October 2  through October 6 with alcoholic hepatitis. Renal function was normal at  that time with a serum creatinine of 0.6. According to the discharge  summary, he at that point in time had no interest in discontinuing his  alcohol use, entry in alcohol rehabilitation, or consumption of  lactulose or other medications. His renal function was entirely normal  as previously mentioned.   He was readmitted last evening with one week of anorexia, severe  diarrhea, and progressive weakness. He was found to have a serum sodium  of 116, a potassium of 3.4, BUN 94, and a creatinine of 7.56. Bilirubin  which had been 9.4 at the time of hospital discharge in early October  was up to 12.4. Albumin was low at 1.3, and calcium was 6.9 (corrected  calcium normal). We are therefore asked to see him because of his acute  renal failure and electrolyte abnormalities. He has received IV normal  saline over night with 2 liters in and 200 mL out.   He indicates to [employer] he is now ready to stop drinking.   PAST MEDICAL HISTORY:  1. History of gastric bypass with Roux-en-Y procedure with subsequent      loss of 150 pounds.  2. Alcohol abuse.  3. Remote history of esophageal stricture.  4. Prior to gastric bypass, issues with glucose intolerance and blood      pressure, but these have no longer been an issue.   His current medications are:  1. Calcium  twice a day.  2. Rocephin every 24 hours.  3. Prozac 40 mg a day.  4. Folate 1 mg per day.  5. Protonix 40 mg a day.  6. K-Dur 20 mEq a day.  7. Prednisone 40 mg (status post 60 mg of IV Solu-Medrol).  8. Thiamine.  9. Zinc.  10.P.r.n. Zofran.  11.P.r.n. oxycodone.   FAMILY HISTORY:  Noncontributory.   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 He has no children.   REVIEW OF SYSTEMS:  Diarrhea had stopped since entry into the hospital.  He has had no chest pain, nausea or vomiting and no shortness of breath.  He does note that he intermittently has leg and abdominal swelling at  home. He reports urinary frequency with some dysuria prior to admission.   PHYSICAL EXAMINATION:  He is jaundiced, awake, alert, and very pleasant.  He is a little bit slow to answer some questions but is appropriate.  Blood pressure 105/71, temperature 98, respiratory rate 16. Two hundred  mL of urine output has been recorded since last evening.  He has marked scleral icterus. He has no discernible neck vein  distention.  LUNGS:  Fields are clear at this time.  CARDIAC:  Rhythm is regular. S1-S2 normal. No S3. No murmur.  He has abdominal scars from his prior gastric bypass surgery. His  abdomen is soft. Liver is down about 3 cm. I cannot really appreciate  his spleen or palpate his bladder. His abdomen is totally nontender. He  has no edema of the lower extremities. He does have asterixis.   LABORATORY DATA:  Sodium 118 (up from 116), potassium 3.5, chloride 81,  CO2 21, BUN 96, creatinine 7.21 (creatinine is down from 7.56), glucose  106. Bilirubin 11.7, alkaline phosphatase 400, SGOT 137, SGPT 79. Serum  albumin is 1.1, magnesium 1.5. WBC 31,900, hemoglobin 8.6. Urinalysis:  Specific gravity 1.016, large bilirubin, large blood, too numerous to  count Wach cells, and many bacteria. Clostridium difficile is pending.  Ultrasound of the abdomen is pending,  from his October 6 admission, he  had marked hepatomegaly, and kidneys were noted to be within normal  limits although no sizes were given.   IMPRESSION:  A 55 year old [place] male with acute alcohol hepatitis who  has developed acute renal failure in that setting. Hopefully, a large  majority of this is volume depletion. We may be seeing some improvement  in his renal function with IV fluid hydration. I say that because his  creatinine is down a little. his serum sodium has improved a little, and  he is making some urine. There are no urine studies specifically for  estimation of fractional sodium excretion. Hopefully, we are not looking  at hepatorenal syndrome.   RECOMMENDATIONS:  1. Continue IV fluids with normal saline for now.  2. Strict intake and output. Would limit his p.o. free water to 1000      mL a day because of his hyponatremia and continue his fluids as      normal saline.  3. Check urine electrolytes and creatinine to estimate fractional      sodium excretion.  4. Obtain ultrasound to rule out the possibility of hydro, albeit      remote.  5. Agree with Rocephin, and urine culture is pending - he does have      symptoms of a possible urinary tract infection, and his urine does      appear dirty on urinalysis.  6. We touched briefly on the dialysis issue. Because of his young age,      because this is all acute alcohol hepatitis (he does not yet have      end-stage liver disease), his renal failure is acute, and because      he indicates that he plans to stop drinking, he would potentially      be a candidate for hemodialysis. He states that he would want      dialysis either in the short or long run. I do not believe he has      any indications as of yet and would like to follow him a few more      days with IV fluid hydration which he seems to be tolerating okay.  7. His hemoglobin is 8.6. He needs iron studies, B12, folate, etc.      Transfusion would be a good way  to expand his intravascular volume      without  third spacing.   Thanks for asking [place] to see him. I do not see an indication to transfer  him to [facility] for dialysis at this point but will keep an eye out for  that.      Duke Salvia Eliott Nine, M.D.  Electronically Signed     CBD/MEDQ  D:  03/01/2008  T:  03/01/2008  Job:  [id number]

## 2010-09-18 NOTE — Discharge Summary (Signed)
NAME:  [name], [name]                 ACCOUNT NO.:  [id number]   MEDICAL RECORD NO.:  [id number]          PATIENT TYPE:  INP   LOCATION:  5009                         FACILITY:  [facility]   PHYSICIAN:  [name], M.D. DATE OF BIRTH:  Oct 17, 1955   DATE OF ADMISSION:  06/08/2008  DATE OF DISCHARGE:  06/13/2008                               DISCHARGE SUMMARY   DISCHARGE DIAGNOSES:  1. Left femoral neck fracture status post open reduction and internal      fixation.  2. History of extensive heavy alcohol abuse with alcoholic hepatitis.  3. Hypertension.  4. Hypomagnesemia.  5. Hypokalemia.   Discharge medications include:  1. Vicodin 5/500 mg one to two tablets every 4 hours as needed for      pain.  2. Multivitamin 1 tablet daily.  3. Prozac 40 mg daily.  4. Potassium 20 mEq twice daily.  5. Mag oxide 400 mg daily.  6. Os-Cal D 500 mg 2 tablets a day.  7. Zinc 220 mg daily.  8. Vitamin D 2000 International Units daily.  9. Thiamine 100 mg daily.  10.Folic acid 1 mg daily.  11.Lactulose 15 mL twice a day.  12.Metolazone 2.5 mg 3 times a week if needed for lower extremity      edema (this has been prescribed by his primary care physician).   DISPOSITION AND FOLLOWUP:  The patient is discharged home in stable  condition.  He has had an ORIF of his left femoral neck fracture by Dr.  Charlann [name].  He will follow up in the office with him in 2 weeks.  He will  also start seeing a new a primary care physician who will be [name]  with [name] at [place].   CONSULTATION THIS HOSPITALIZATION:  [name] with Orthopedics.   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   HISTORY AND PHYSICAL  EXAMINATION:  For full details, please refer to  history and physical dictated by Dr. Sharon [name] on June 08, 2008, but in  brief, [name] is a 55 year old Caucasian man with prior history of  alcoholic related hepatitis who is now completely abstinent for alcohol  for at least 3-4 month who was walking to his mailbox out in the snow  when he fell on the ice, was on the ground for about 2 hours, was  finally able to crawl under the car and grab his cell phone (where it  had fallen) and was able to call a neighbor who came and assisted him  who later called 911.  When he came into the emergency room, he was  found to have a left hip fracture and orthopedics had requested a  medical admission to manage his other medical comorbidities.   HISTORY AND PHYSICAL EXAMINATION:  1. Left normal neck fracture.  He underwent ORIF by Dr.  Charlann [name].  He is      now okay to discharge.  He will continue his PT at home.  2. Alcoholic hepatitis.  His transaminases were within normal limits      while in the hospital.  3. Hypertension.  He had excellent blood pressure while in the      hospital not on any medications.  4. Hypokalemia which is likely secondary to hypomagnesemia.  This has      been repleted.  5. Hypomagnesemia.  He will continue p.o. replacement at home.   VITAL SIGNS UPON DISCHARGE:  Blood pressure 135/80, heart rate 84,  respirations 20, O2 sat is 96% on room air with a temperature of 98.2.   LABS ON DAY OF DISCHARGE:  Sodium 33, potassium 3.6, chloride 102,  bicarb 25, BUN 10, creatinine 0.80 with a glucose of 107.  WBCs 9.2,  hemoglobin 9.9, platelets of 206, and a magnesium level of 1.3.      [name], M.D.  Electronically Signed     EH/MEDQ  D:  06/13/2008  T:  06/14/2008  Job:  [id number]   cc:   Madlyn Frankel Charlann [name], M.D.  Theressa Millard, M.D.

## 2010-09-18 NOTE — Assessment & Plan Note (Signed)
[facility]                         GASTROENTEROLOGY OFFICE NOTE   NAME:[name], [name]                        MRN:          [id number]  DATE:03/23/2007                            DOB:          08/13/1955    GASTROENTEROLOGY CONSULTATION   REASON FOR CONSULTATION:  Screening colonoscopy.   HISTORY OF PRESENT ILLNESS:  Mr. [name] is a pleasant 55 year old [name]  male referred through the courtesy of Dr. Arlyce Dice for evaluation.  A  screening colonoscopy was requested.  Except for some loose stools, Mr.  [name] has no GI complaints.  He has a history of esophageal stricture  which was last dilated three years ago.  He denies dysphagia or pyrosis.  He has had no rectal bleeding or change in bowel habits.  He underwent  gastric bypass surgery 18 months ago and has lost 150 pounds.   PAST MEDICAL HISTORY:  Is pertinent for hypertension and diabetes which  have since subsided following his surgery.   FAMILY HISTORY:  Noncontributory.   MEDICATIONS:  He is on no medications.   ALLERGIES:  He has no allergies.   SOCIAL HISTORY:  He does not smoke.  He drinks rarely.  He is married  and retired.   REVIEW OF SYSTEMS:  Was reviewed and is negative.   PHYSICAL EXAMINATION:  VITAL SIGNS:  Pulse 82.  Blood pressure 142/92.  Weight 216.  HEENT:  EOMI.  PERRLA.  Sclerae are anicteric.  Conjunctivae are pink.  NECK:  Supple without thyromegaly, adenopathy or carotid bruits.  CHEST:  Clear to auscultation and percussion without adventitious  sounds.  CARDIAC:  Regular rhythm; normal S1 S2.  There are no murmurs, gallops  or rubs.  ABDOMEN:  Bowel sounds are normoactive.  Abdomen is soft, nontender and  nondistended.  There are no abdominal masses, tenderness, splenic  enlargement or hepatomegaly.  EXTREMITIES:  Full range of motion.  No cyanosis, clubbing or edema.  RECTAL:  Deferred.   ASSESSMENT/PLAN:  I will schedule Mr. [name] for a screening  colonoscopy.     Barbette Hair. Arlyce Dice, MD,FACG  Electronically Signed    RDK/MedQ  DD: 03/23/2007  DT: 03/24/2007  Job #: [id number]   cc:   Teena Irani. Arlyce Dice, M.D.

## 2010-09-18 NOTE — Op Note (Signed)
NAME:  [name], [name]                 ACCOUNT NO.:  [id number]   MEDICAL RECORD NO.:  [id number]          PATIENT TYPE:  INP   LOCATION:  3737                         FACILITY:  [facility]   PHYSICIAN:  Madlyn Frankel. Charlann Boxer, M.D.  DATE OF BIRTH:  [birth year]   DATE OF PROCEDURE:  06/09/2008  DATE OF DISCHARGE:                               OPERATIVE REPORT   PREOPERATIVE DIAGNOSIS:  Left intertrochanteric femur fracture.   POSTOPERATIVE DIAGNOSIS:  Left intertrochanteric femur fracture.   PROCEDURE:  Open reduction and internal fixation of left  intertrochanteric femur fracture utilizing DePuy troch nail 11 x 200 mm  at 135 degrees with a 110-mm lag screw and 36-mm distal interlock.   SURGEON:  Madlyn Frankel. Charlann Boxer, MD   ASSISTANT:  Surgical tech.   ANESTHESIA:  General.   FINDINGS:  None.   SPECIMENS:  None.   COMPLICATIONS:  None.   ESTIMATED BLOOD LOSS:  Less than 100 mL.   DISPOSITION:  The patient was stable to recovery room.   INDICATIONS OF PROCEDURE:  [name] is a 55 year old gentleman who  unfortunately slipped on ice and landed directly on his left side.  He  had no other injuries to report other than acute onset of left hip pain  and inability to bear weight.  He was brought to the emergency room.  Radiographs revealed intertrochanteric femur fracture.  [name]  unfortunately has multiple medical issues.  He is admitted to the  Medical Service.  Orthopedics was consulted initially to Dr. Leonides Grills.  I was asked to assume definitive treatment from a surgical  standpoint.  Risks and benefits of the planned procedure of an  intramedullary nailing were discussed.  Consent was obtained.   PROCEDURE IN DETAIL:  The patient was brought to operative theater.  Once adequate anesthesia and preoperative antibiotics, Ancef  administered, the patient was positioned supine on the fracture table.  His right leg was flexed and abducted out of the way with bony  prominences padded.   His left foot was placed in traction shoe.   Under fluoroscopic imaging traction, internal rotation reduced the  fracture into an anatomic alignment.  Time-out was performed identifying  the patient, extremity, and planned procedure.  The left lower extremity  was then prepped and draped from the iliac crest down to the knee area.   Under fluoroscopic imaging, I then identified the landmarks and a  proximal incision was made proximal to the tip of the trochanter.  Sharp  dissection was carried down through the gluteal fascia.  The guidewire  was then inserted into the tip of the trochanter.  I then drilled the  proximal femur.  Once this was opened and based on the valgus nature of  his femoral neck, I passed a 135-degrees x 11 x 200 mm nail.  I got this  into its correct position based on the inferior aspect and neck and  calcar.  I then used the guide off of the entry insertion jig to place a  guide pin into the center of the femoral head in AP and  lateral planes.  Once this was confirmed, I drilled to 110 mm, tapped to 110 mm, and then  placed a 110-mm lag screw compressing and medializing the shaft of the  neck fracture.   I had excellent bite within the bone in the femoral head.   At this point, a 200-mm distal interlock was placed by drilling the  distal hole placing the screw.   Once this was done, all wounds were irrigated with normal saline  solution.  I took final radiographs.  The wounds were closed in layers  with the proximal wound closed with #1 Vicryl in the gluteal fascia and  we used 2-0 Vicryl in the subcu layer, and staples on the skin.  Skin  was cleaned, dried, and dressed sterilely, and taken to the recovery  room stable condition and extubated.  The patient will be transferred  back to the hospital for stabilize medically prior to transferring to  home versus nursing facility for rehab based on his recovery with  therapy.      Madlyn Frankel Charlann Boxer, M.D.   Electronically Signed     MDO/MEDQ  D:  06/09/2008  T:  06/10/2008  Job:  [id number]

## 2010-09-18 NOTE — Consult Note (Signed)
NAME:  [name], [name]                 ACCOUNT NO.:  [id number]   MEDICAL RECORD NO.:  [id number]          PATIENT TYPE:  INP   LOCATION:  3737                         FACILITY:  [facility]   PHYSICIAN:  Leonides Grills, M.D.     DATE OF BIRTH:  1956/04/12   DATE OF CONSULTATION:  06/08/2008  DATE OF DISCHARGE:                                 CONSULTATION   CHIEF COMPLAINT:  Left hip pain.   HISTORY:  This is a 55 year old male who slipped and fell today onto his  left hip.  He had immediate pain.  He was then taken to [facility] ER where he was evaluated, x-rays were obtained.  He was found to  have a left intertrochanteric fracture, but due to the fact that he had  multiple medical problems to include alcoholic liver disease, cirrhosis  of liver, coagulopathy, failure to thrive, gastric ulcers, and renal  failure, he was admitted to the [facility] Medical Service here at [facility].   PAST MEDICAL HISTORY:  As above.   PAST SURGICAL HISTORY:  Gastric bypass.   SOCIAL HISTORY:  He is a nonsmoker, no alcohol.  No known drug abuse.  Former heavy drinker.   He takes Prozac, torsemide.   PHYSICAL EXAMINATION:  GENERAL:  Well nourished, well developed in no  apparent distress.  EXTREMITIES:  He has palpable dorsalis pedis and posterior tibial pulse.  Left hip is short and externally rotated.  Abrasion over the left  shoulder.   X-RAYS:  AP pelvis and AP and lateral views of left hip show a displaced  left intertrochanteric fracture.   IMPRESSION:  Left intertrochanteric fracture, displaced.   PLAN:  I went over the procedure with the patient and discussed this  with Dr. Charlann Boxer and Dr. Charlann Boxer will perform an IM nailing of the left  intertrochanteric fracture once the patient is medically optimized by  the [facility].  This is tentatively scheduled for tomorrow.  He  was explained of risks, which include infection or vessel injury, DVT,  possible PE, nonunion,  malunion, hardware tissue and hardware failure,  and screw head cutout all explained.  Questions were encouraged and  answered.      Leonides Grills, M.D.  Electronically Signed     PB/MEDQ  D:  06/08/2008  T:  06/09/2008  Job:  [id number]

## 2010-09-18 NOTE — Discharge Summary (Signed)
NAME:  [name], [name]                 ACCOUNT NO.:  [id number]   MEDICAL RECORD NO.:  [id number]          PATIENT TYPE:  INP   LOCATION:  1513                         FACILITY:  [facility]   PHYSICIAN:  Herbie Saxon, MDDATE OF BIRTH:  Apr 09, 1956   DATE OF ADMISSION:  02/29/2008  DATE OF DISCHARGE:  03/25/2008                               DISCHARGE SUMMARY   ADDENDUM   This is an addendum to the discharge summary previously dictated by Dr.  Ardyth Harps and Dr. Marthann Schiller.   DISCHARGE DIAGNOSES:  Are as previously stated.   DISCHARGE CONDITION:  Stable.   DISPOSITION:  To the nursing facility.   DIET:  To be low salt, low protein, renal.   ACTIVITY:  As tolerated.  She is going to need some physical therapy at  the nursing facility.  Fall precaution will be taken.   MEDICATIONS:  Continue as previously stated.  1. Note that Prozac has been increased to 40 mg twice daily.  2. Lactulose has been reduced to 15 mL b.i.d.  Hold if loose stools of      more than 3 x  daily.  3. Imodium 2 mg q.8h for profuse diarrhea.   Vital signs are stable.  Temperature is 98, pulse 95, respiratory rate  20, blood pressure 120/80.  CBC on March 24, 2008 showed WBC 9.5,  hematocrit 29.6, platelet count 1.21.  Chemistry on March 24, 2008  showed sodium 136, potassium 3.5, chloride 113, bicarbonate 20, glucose  92, BUN 90, creatinine 1.0.  Alkaline phosphatase 192.  AST 36, ALT 32.   Discharge time greater than 30 minutes.   He will follow up with PCP who will coordinate GI  followup, nephrology  followup with Dr. Eliott Nine as needed, and psychology followup as well as  in the next 2-3 weeks.      Herbie Saxon, MD  Electronically Signed     MIO/MEDQ  D:  03/25/2008  T:  03/25/2008  Job:  [id number]

## 2010-09-18 NOTE — Discharge Summary (Signed)
NAME:  [name], [name]                 ACCOUNT NO.:  [id number]   MEDICAL RECORD NO.:  [id number]          PATIENT TYPE:  INP   LOCATION:  1513                         FACILITY:  [facility]   PHYSICIAN:  Peggye Pitt, M.D. DATE OF BIRTH:  07-29-1955   DATE OF ADMISSION:  02/29/2008  DATE OF DISCHARGE:                               DISCHARGE SUMMARY   DATE OF DISCHARGE:  Still to be determined.   ADDENDUM TO DISCHARGE SUMMARY:  This is an addendum to discharge summary  dictated on March 08, 2008, by Dr. [name] and also addendum to  discharge summary dictated on March 15, 2008, by Dr. Sander Radon.  For  further details of the discharge summary, please refer to H&P dictated  by Dr. [name] on March 08, 2008.  Since last discharge note was  dictated, no further acute medical issues have arisen.  He has alcoholic  hepatitis.  He has had stable transaminitis.  He did require one unit of  PRBCs for a hemoglobin of 7.9, but otherwise, hemoglobin has been stable  status post transfusion.  He has been having some diarrhea, but this is  much better now with Imodium.  He is only having 1-2 bowel movements a  day.  He also had a non-gap metabolic acidosis which is secondary to the  diarrhea with bicarbonate level of 18.  This has been stable.  His main  issue, since last discharge summary, has been his deconditioning and  adult failure to thrive.  For this issue, he will be going to a skilled  nursing facility for rehabilitation purposes.  Please note that his  insurance had denied an inpatient rehabilitation stay.   MEDICATIONS:  Up to date and include:  1. Calcium carbonate 500 mg p.o. b.i.d.  2. Prozac 40 mg p.o. daily.  3. Folic acid 1 mg p.o. daily.  4. Ensure t.i.d. with meals.  5. Megace 800 mg p.o. daily.  6. Multivitamin one tablet daily.  7. Protonix 40 mg p.o. b.i.d.  8. K-Ciel 30 mEq p.o. daily.  9. Sucralfate 1 g p.o. q.i.d.  10.Zinc sulfate 220 mg p.o. daily.  11.Imodium 2 mg  p.o. p.r.n. every loose bowel movement with a maximum      of 16 mg daily.  12.Variety of p.r.n. medications to include Zofran and Ambien.   For the rest of the details of prolonged hospital course, please refer  to a previous referenced discharge summary.      Peggye Pitt, M.D.  Electronically Signed     EH/MEDQ  D:  03/22/2008  T:  03/22/2008  Job:  [id number]

## 2010-09-18 NOTE — Discharge Summary (Signed)
NAME:  [name], [name]                 ACCOUNT NO.:  [id number]   MEDICAL RECORD NO.:  [id number]          PATIENT TYPE:  INP   LOCATION:  1513                         [facility]ACILITY:  [facility]   PHYSICIAN:  Altha Harm, MDDATE OF BIRTH:  19-Jan-1956   DATE OF ADMISSION:  02/29/2008  DATE OF DISCHARGE:                               DISCHARGE SUMMARY   DATE OF DISCHARGE:  Not yet determined.   FINAL DISCHARGE DIAGNOSES:  1. Alcoholic hepatitis.  2. Dehydration.  3. Protein calorie malnutrition.  4. Adult failure to thrive.  5. Renal failure improving.  6. Hyperammonemia improved.  7. Hypokalemia, improved.  8. Auto anticoagulation secondary to liver failure.  9. 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  10.Leukocytosis likely secondary to steroids.  11.E-coli urinary tract infection fully treated.  12.Possible gastropathy  13.Gastric ulcers.  14.Alcohol encephalopathy improved.  15.Generalized weakness.  16.Gait disturbance.  17.Diarrhea secondary to lactulose.   DISCHARGE MEDICATIONS:  Please note that these medications will need to  be modified at time of discharge.  However, they include the following  1. Neomycin 2 grams p.o. daily.  2. Os-Cal 500 mg p.o. b.i.d.  3. Ensure one can 237 ml p.o. t.i.d.  4. Prozac 40 mg p.o. daily.  5. Folic acid 1 mg p.o. daily.  6. Lactulose 30 ml p.o. b.i.d.  7. Megace 800 mg p.o. daily.  8. Multivitamin one tablet p.o. daily.  9. Protonix 40 mg p.o. daily.  10.Potassium chloride 30 mEq p.o. daily.  11.Carafate 1 gram p.o. q.i.d.  12.Zinc sulfate 220 mg p.o. daily.  13.Prednisone 40 mg p.o. daily, currently, and this is to be tapered      over a 4-week period.   CONSULTANTS:  1. Nephrology.  2. [facility].   PROCEDURES:  EGD done on February 24, 2008, which showed esophagitis  reflux, gastric ulcer without hemorrhage.  Function and Roux-en-Y  gastrojejunostomy  for obesity, small gastric pouch, a retained suture  ulcer x2,  portal hypertensive gastropathy, no esophageal or gastric  varices.   STUDY:  1. Chest x-ray two-view done 26 which shows no active cardiopulmonary      disease.  2. Ultrasound of the abdomen done on October 28 which shows a fatty      enlarged liver.  Gallbladder sludge, wall thickening and merely to      ascites.  3. Portable chest x-ray done on October 31 which shows negative for      pneumonia.   CODE STATUS:  The patient's current code status is full code.   ALLERGIES:  THERE ARE NO KNOWN DRUG ALLERGIES.   PRIMARY CARE PHYSICIAN:  Dr. Leslee Home.   CHIEF COMPLAINT:  Progressive weakness and diarrhea.   HISTORY OF PRESENT ILLNESS:  Please see H&P dictated by Dr. Orvan Falconer on  10/26 details of HPI.  However, in short,  this is a gentleman who had a  known history of alcoholic hepatitis who was discharged approximately 3  weeks prior to home.  The patient at that time went home and continued  to drink alcohol and came back in with exacerbation  of alcoholic  hepatitis with jaundice and elevated enzymes and ammonia.  1. Alcoholic hepatitis.  The patient went home continued to use      alcohol albeit in very small amounts.  The patient, however, had      very poor oral intake either of liquids or solids.  He became      profoundly dehydrated with marked elevations of his liver enzymes      and transaminitis.  The patient was given initial hydration and an      increase in his lactulose.  The patient, over time with hydration      improved his transaminitis with a decrease in all of the liver      enzymes.  Gastroenterology was consulted to see the patient to      agree with the management and also agreed that the patient was at a      child's classification C of liver disease with features of end-      stage liver disease in the form of derangement of synthetic      function.  The patient was also started on steroids for the      treatment of his condition at this time.   Currently he is taking 40      mg of prednisone, and this is to be tapered over a 4-week period.  2. Acute renal failure.  The patient came in with a profound acute      renal failure.  Initially, there was a concern about hepatic renal      syndrome.  However, the patient responded to fluid and his renal      function is almost back to normal at this time, thus, eliminating      the idea of hepatorenal syndrome.  The patient admittedly has very      poor oral intake, both with fluids and with solids.  The patient      currently is off IV fluids.  He had been on a fluid restriction.      This has not been lifted, and the patient is able to drink fluids.      The patient does run the risk of going back into renal failure if      he continues to have very poor oral intake.  3. Hyperammonemia.  The patient had elevated ammonia, was treated      initially with lactulose with high levels.  The limitation of his      protein intake.  Neomycin is also been added for the patient, and      the patient has had a very good response with neomycin with his      ammonia levels now within normal ranges.  4. Protein calorie malnutrition.  The patient has had very poor oral      intake.  There has been some discussion about a feeding tube as an      option for this patient.  However, I discussed this with Dr. Juanda Chance      who was the gastroenterologist attending to the patient up until      this weekend.  She feels that this is not an appropriate choice for      the patient given the patient's unique condition, one being child      stage III of his liver disease, next being that he has had a Roux-      en-Y surgery, and third being that the patient is at risk for  diarrhea and receding syndrome and he has stated that he would      rather die than to incur constant diarrhea.  This, in fact, would      decrease the quality of this patient's life.  5. Generalized weakness.  The patient was has a  generalized weakness      as a result of his disease and his poor nutrition.  Physical      therapy and occupational therapy have been working with the patient      and have recommended skilled facility for this patient versus      inpatient rehab evaluation.  Presently, I am consulting inpatient      rehab to perform an evaluation.  However, social work is already      involved in the search for a skilled nursing facility for continued      rehab.  6. Alcoholic rehab.  The patient is now agreeing to alcohol rehab.      However, physically he is in no shape to be admitted to an alcohol      rehab.  The hope is that the patient will be rehabbed in a skilled      facility versus inpatient rehab to the level where he is able to      ambulate independently and at which time he can enter into an      alcohol rehab program.  7. Electrolyte imbalance in the patient.  Had several electrolyte      imbalances while hospitalized, all of which have been corrected by      replacement.  8. E-coli urinary tract infection.  The patient was diagnosed with a E-      coli urinary tract infection upon arrival to the hospital.  Has      been fully treated with Rocephin and a repeat urine culture has      indicated no presence of infection.  This brings the patient's      hospital course up to date.      Altha Harm, MD  Electronically Signed     MAM/MEDQ  D:  03/08/2008  T:  03/08/2008  Job:  [id number]   cc:   Reuben Likes, M.D.  Fax: [phone]   Hedwig Morton. Juanda Chance, MD  [address]  [place]  [place] [address]   Wilhemina Bonito. Marina Goodell, MD  [address]  [place]  [place] [address]   Garnetta Buddy, M.D.  Fax: [phone]

## 2011-02-05 LAB — DIFFERENTIAL
Basophils Absolute: 0
Basophils Absolute: 0.3 — ABNORMAL HIGH
Basophils Absolute: 1.1 — ABNORMAL HIGH
Basophils Absolute: 0
Basophils Relative: 0
Basophils Relative: 0
Basophils Relative: 0
Basophils Relative: 0
Basophils Relative: 3 — ABNORMAL HIGH
Basophils Relative: 0
Eosinophils Absolute: 0
Eosinophils Absolute: 0
Eosinophils Absolute: 0
Eosinophils Absolute: 0.1
Eosinophils Absolute: 0
Eosinophils Relative: 0
Eosinophils Relative: 1
Eosinophils Relative: 1
Eosinophils Relative: 1
Lymphocytes Relative: 17
Lymphocytes Relative: 2 — ABNORMAL LOW
Lymphs Abs: 0.7
Lymphs Abs: 1.2
Lymphs Abs: 1.6
Lymphs Abs: 1.7
Monocytes Absolute: 0.4
Monocytes Absolute: 0.6
Monocytes Absolute: 0.8
Monocytes Absolute: 0.8
Monocytes Absolute: 1.1 — ABNORMAL HIGH
Monocytes Absolute: 1.4 — ABNORMAL HIGH
Monocytes Absolute: 0.9
Monocytes Relative: 11
Monocytes Relative: 12
Monocytes Relative: 6
Monocytes Relative: 4
Neutro Abs: 10.7 — ABNORMAL HIGH
Neutro Abs: 33.7 — ABNORMAL HIGH
Neutro Abs: 6.8
Neutro Abs: 7
Neutro Abs: 9.1 — ABNORMAL HIGH
Neutro Abs: 20.4 — ABNORMAL HIGH
Neutrophils Relative %: 80 — ABNORMAL HIGH
Neutrophils Relative %: 94 — ABNORMAL HIGH

## 2011-02-05 LAB — COMPREHENSIVE METABOLIC PANEL
ALT: 181 — ABNORMAL HIGH
ALT: 207 — ABNORMAL HIGH
ALT: 84 — ABNORMAL HIGH
ALT: 112 — ABNORMAL HIGH
ALT: 122 — ABNORMAL HIGH
ALT: 126 — ABNORMAL HIGH
ALT: 148 — ABNORMAL HIGH
ALT: 32
ALT: 50
ALT: 83 — ABNORMAL HIGH
ALT: 86 — ABNORMAL HIGH
AST: 137 — ABNORMAL HIGH
AST: 138 — ABNORMAL HIGH
AST: 209 — ABNORMAL HIGH
AST: 230 — ABNORMAL HIGH
AST: 247 — ABNORMAL HIGH
AST: 129 — ABNORMAL HIGH
AST: 49 — ABNORMAL HIGH
AST: 54 — ABNORMAL HIGH
AST: 87 — ABNORMAL HIGH
Albumin: 1.1 — ABNORMAL LOW
Albumin: 1.3 — ABNORMAL LOW
Albumin: 1.4 — ABNORMAL LOW
Albumin: 2.1 — ABNORMAL LOW
Albumin: 2.4 — ABNORMAL LOW
Albumin: 1.2 — ABNORMAL LOW
Albumin: 1.3 — ABNORMAL LOW
Albumin: 1.3 — ABNORMAL LOW
Albumin: 1.4 — ABNORMAL LOW
Albumin: 1.4 — ABNORMAL LOW
Alkaline Phosphatase: 188 — ABNORMAL HIGH
Alkaline Phosphatase: 198 — ABNORMAL HIGH
Alkaline Phosphatase: 206 — ABNORMAL HIGH
Alkaline Phosphatase: 404 — ABNORMAL HIGH
Alkaline Phosphatase: 432 — ABNORMAL HIGH
Alkaline Phosphatase: 463 — ABNORMAL HIGH
Alkaline Phosphatase: 192 — ABNORMAL HIGH
Alkaline Phosphatase: 258 — ABNORMAL HIGH
Alkaline Phosphatase: 293 — ABNORMAL HIGH
Alkaline Phosphatase: 306 — ABNORMAL HIGH
Alkaline Phosphatase: 353 — ABNORMAL HIGH
Alkaline Phosphatase: 353 — ABNORMAL HIGH
BUN: 11
BUN: 70 — ABNORMAL HIGH
BUN: 95 — ABNORMAL HIGH
BUN: 96 — ABNORMAL HIGH
BUN: 18
BUN: 23
BUN: 34 — ABNORMAL HIGH
BUN: 66 — ABNORMAL HIGH
CO2: 21
CO2: 21
CO2: 16 — ABNORMAL LOW
CO2: 20
CO2: 20
CO2: 22
Calcium: 8 — ABNORMAL LOW
Calcium: 8.3 — ABNORMAL LOW
Calcium: 7.8 — ABNORMAL LOW
Calcium: 7.8 — ABNORMAL LOW
Calcium: 8 — ABNORMAL LOW
Calcium: 8 — ABNORMAL LOW
Calcium: 8 — ABNORMAL LOW
Calcium: 8.1 — ABNORMAL LOW
Chloride: 100
Chloride: 76 — CL
Chloride: 87 — ABNORMAL LOW
Chloride: 100
Chloride: 107
Chloride: 112
Chloride: 112
Chloride: 113 — ABNORMAL HIGH
Chloride: 115 — ABNORMAL HIGH
Chloride: 115 — ABNORMAL HIGH
Creatinine, Ser: 0.69
Creatinine, Ser: 0.99
Creatinine, Ser: 7.21 — ABNORMAL HIGH
Creatinine, Ser: 7.56 — ABNORMAL HIGH
Creatinine, Ser: 1.06
Creatinine, Ser: 1.23
Creatinine, Ser: 1.27
Creatinine, Ser: 1.36
Creatinine, Ser: 1.57 — ABNORMAL HIGH
Creatinine, Ser: 1.91 — ABNORMAL HIGH
GFR calc Af Amer: 10 — ABNORMAL LOW
GFR calc Af Amer: 9 — ABNORMAL LOW
GFR calc Af Amer: >60
GFR calc Af Amer: >60
GFR calc Af Amer: 57 — ABNORMAL LOW
GFR calc Af Amer: >60
GFR calc Af Amer: >60
GFR calc Af Amer: >60
GFR calc non Af Amer: 8 — ABNORMAL LOW
GFR calc non Af Amer: 37 — ABNORMAL LOW
GFR calc non Af Amer: 55 — ABNORMAL LOW
GFR calc non Af Amer: >60
GFR calc non Af Amer: >60
Glucose, Bld: 122 — ABNORMAL HIGH
Glucose, Bld: 138 — ABNORMAL HIGH
Glucose, Bld: 149 — ABNORMAL HIGH
Glucose, Bld: 184 — ABNORMAL HIGH
Glucose, Bld: 103 — ABNORMAL HIGH
Glucose, Bld: 105 — ABNORMAL HIGH
Glucose, Bld: 106 — ABNORMAL HIGH
Glucose, Bld: 117 — ABNORMAL HIGH
Glucose, Bld: 118 — ABNORMAL HIGH
Glucose, Bld: 123 — ABNORMAL HIGH
Glucose, Bld: 131 — ABNORMAL HIGH
Glucose, Bld: 176 — ABNORMAL HIGH
Glucose, Bld: 207 — ABNORMAL HIGH
Glucose, Bld: 92
Potassium: 2.7 — CL
Potassium: 3.2 — ABNORMAL LOW
Potassium: 3.4 — ABNORMAL LOW
Potassium: 3.6
Potassium: 3.8
Potassium: 3.8
Potassium: 3.5
Potassium: 3.6
Potassium: 3.6
Potassium: 3.8
Potassium: 3.8
Sodium: 131 — ABNORMAL LOW
Sodium: 132 — ABNORMAL LOW
Sodium: 134 — ABNORMAL LOW
Sodium: 136
Sodium: 136
Sodium: 136
Sodium: 137
Sodium: 137
Sodium: 137
Sodium: 138
Total Bilirubin: 10.1 — ABNORMAL HIGH
Total Bilirubin: 12.4 — ABNORMAL HIGH
Total Bilirubin: 12.6 — ABNORMAL HIGH
Total Bilirubin: 8.5 — ABNORMAL HIGH
Total Bilirubin: 9.7 — ABNORMAL HIGH
Total Bilirubin: 2.2 — ABNORMAL HIGH
Total Bilirubin: 2.3 — ABNORMAL HIGH
Total Bilirubin: 2.6 — ABNORMAL HIGH
Total Bilirubin: 3.8 — ABNORMAL HIGH
Total Bilirubin: 4.1 — ABNORMAL HIGH
Total Bilirubin: 4.4 — ABNORMAL HIGH
Total Bilirubin: 7.7 — ABNORMAL HIGH
Total Protein: 4.7 — ABNORMAL LOW
Total Protein: 4.9 — ABNORMAL LOW
Total Protein: 5.2 — ABNORMAL LOW
Total Protein: 5.4 — ABNORMAL LOW
Total Protein: 5.5 — ABNORMAL LOW
Total Protein: 5.6 — ABNORMAL LOW
Total Protein: 4.4 — ABNORMAL LOW
Total Protein: 4.6 — ABNORMAL LOW
Total Protein: 4.6 — ABNORMAL LOW
Total Protein: 4.8 — ABNORMAL LOW
Total Protein: 5 — ABNORMAL LOW
Total Protein: 5 — ABNORMAL LOW
Total Protein: 5.2 — ABNORMAL LOW

## 2011-02-05 LAB — CROSSMATCH
ABO/RH(D): O POS
ABO/RH(D): O POS
Antibody Screen: NEGATIVE
Antibody Screen: NEGATIVE
Antibody Screen: NEGATIVE

## 2011-02-05 LAB — CBC
HCT: 23.9 — ABNORMAL LOW
HCT: 24.6 — ABNORMAL LOW
HCT: 24.9 — ABNORMAL LOW
HCT: 25.8 — ABNORMAL LOW
HCT: 27 — ABNORMAL LOW
HCT: 20.5 — ABNORMAL LOW
HCT: 24.2 — ABNORMAL LOW
HCT: 25.5 — ABNORMAL LOW
HCT: 27.6 — ABNORMAL LOW
Hemoglobin: 8.5 — ABNORMAL LOW
Hemoglobin: 8.7 — ABNORMAL LOW
Hemoglobin: 8.8 — ABNORMAL LOW
Hemoglobin: 8.9 — ABNORMAL LOW
Hemoglobin: 9 — ABNORMAL LOW
Hemoglobin: 9.4 — ABNORMAL LOW
Hemoglobin: 10.1 — ABNORMAL LOW
Hemoglobin: 7 — CL
Hemoglobin: 7.9 — CL
Hemoglobin: 8.5 — ABNORMAL LOW
Hemoglobin: 8.6 — ABNORMAL LOW
Hemoglobin: 8.7 — ABNORMAL LOW
Hemoglobin: 8.7 — ABNORMAL LOW
Hemoglobin: 8.8 — ABNORMAL LOW
Hemoglobin: 9.1 — ABNORMAL LOW
Hemoglobin: 9.1 — ABNORMAL LOW
Hemoglobin: 9.6 — ABNORMAL LOW
MCHC: 34.2
MCHC: 34.5
MCHC: 35.1
MCHC: 35.5
MCHC: 32.9
MCHC: 33.4
MCHC: 33.7
MCHC: 34
MCHC: 34.2
MCHC: 34.5
MCHC: 34.5
MCHC: 35
MCV: 104.7 — ABNORMAL HIGH
MCV: 104.8 — ABNORMAL HIGH
MCV: 105.5 — ABNORMAL HIGH
MCV: 108.8 — ABNORMAL HIGH
MCV: 109.7 — ABNORMAL HIGH
MCV: 100.8 — ABNORMAL HIGH
MCV: 103.7 — ABNORMAL HIGH
MCV: 104.6 — ABNORMAL HIGH
MCV: 105.3 — ABNORMAL HIGH
MCV: 106.8 — ABNORMAL HIGH
Platelets: 160
Platelets: 182
Platelets: 231
Platelets: 238
Platelets: 243
Platelets: 246
Platelets: 120 — ABNORMAL LOW
Platelets: 152
Platelets: 171
Platelets: 231
Platelets: 94 — ABNORMAL LOW
RBC: 2.16 — ABNORMAL LOW
RBC: 2.21 — ABNORMAL LOW
RBC: 2.32 — ABNORMAL LOW
RBC: 2.37 — ABNORMAL LOW
RBC: 2.51 — ABNORMAL LOW
RBC: 2.58 — ABNORMAL LOW
RBC: 2.26 — ABNORMAL LOW
RBC: 2.27 — ABNORMAL LOW
RBC: 2.47 — ABNORMAL LOW
RBC: 2.48 — ABNORMAL LOW
RBC: 2.52 — ABNORMAL LOW
RBC: 2.56 — ABNORMAL LOW
RBC: 2.75 — ABNORMAL LOW
RDW: 16.1 — ABNORMAL HIGH
RDW: 16.6 — ABNORMAL HIGH
RDW: 16.8 — ABNORMAL HIGH
RDW: 17.8 — ABNORMAL HIGH
RDW: 19 — ABNORMAL HIGH
RDW: 15.6 — ABNORMAL HIGH
RDW: 15.7 — ABNORMAL HIGH
RDW: 15.8 — ABNORMAL HIGH
RDW: 16.5 — ABNORMAL HIGH
RDW: 16.6 — ABNORMAL HIGH
RDW: 16.8 — ABNORMAL HIGH
WBC: 12.1 — ABNORMAL HIGH
WBC: 13.3 — ABNORMAL HIGH
WBC: 35.9 — ABNORMAL HIGH
WBC: 9.6
WBC: 10.8 — ABNORMAL HIGH
WBC: 15.3 — ABNORMAL HIGH
WBC: 16.8 — ABNORMAL HIGH
WBC: 22.4 — ABNORMAL HIGH
WBC: 26 — ABNORMAL HIGH
WBC: 44.3 — ABNORMAL HIGH
WBC: 7.3
WBC: 9.2
WBC: 9.5

## 2011-02-05 LAB — BASIC METABOLIC PANEL
BUN: 3 — ABNORMAL LOW
BUN: 98 — ABNORMAL HIGH
CO2: 20
CO2: 18 — ABNORMAL LOW
CO2: 20
Calcium: 7.7 — ABNORMAL LOW
Calcium: 7.6 — ABNORMAL LOW
Calcium: 7.9 — ABNORMAL LOW
Calcium: 8 — ABNORMAL LOW
Chloride: 91 — ABNORMAL LOW
Creatinine, Ser: 0.64
Creatinine, Ser: 4.36 — ABNORMAL HIGH
Creatinine, Ser: 0.95
Creatinine, Ser: 1.11
Creatinine, Ser: 1.12
GFR calc Af Amer: 27 — ABNORMAL LOW
GFR calc Af Amer: >60
GFR calc Af Amer: >60
GFR calc Af Amer: >60
GFR calc Af Amer: >60
GFR calc non Af Amer: 22 — ABNORMAL LOW
GFR calc non Af Amer: >60
GFR calc non Af Amer: >60
GFR calc non Af Amer: >60
GFR calc non Af Amer: >60
Glucose, Bld: 130 — ABNORMAL HIGH
Potassium: 2.9 — ABNORMAL LOW
Sodium: 129 — ABNORMAL LOW
Sodium: 134 — ABNORMAL LOW
Sodium: 135
Sodium: 136

## 2011-02-05 LAB — FOLATE
Folate: 7.7
Folate: >20

## 2011-02-05 LAB — STOOL CULTURE

## 2011-02-05 LAB — CLOSTRIDIUM DIFFICILE EIA

## 2011-02-05 LAB — FERRITIN: Ferritin: 2696 — ABNORMAL HIGH (ref 22–322)

## 2011-02-05 LAB — HEPATIC FUNCTION PANEL
AST: 120 — ABNORMAL HIGH
AST: 225 — ABNORMAL HIGH
Albumin: 1.3 — ABNORMAL LOW
Albumin: 1.9 — ABNORMAL LOW
Alkaline Phosphatase: 442 — ABNORMAL HIGH
Alkaline Phosphatase: 465 — ABNORMAL HIGH
Bilirubin, Direct: 4.5 — ABNORMAL HIGH
Bilirubin, Direct: 6.6 — ABNORMAL HIGH
Indirect Bilirubin: 4.3 — ABNORMAL HIGH
Total Bilirubin: 8.1 — ABNORMAL HIGH
Total Bilirubin: 8.8 — ABNORMAL HIGH
Total Protein: 5.4 — ABNORMAL LOW

## 2011-02-05 LAB — IRON AND TIBC
Iron: 43
UIBC: 39

## 2011-02-05 LAB — HEPATITIS PANEL, ACUTE
HCV Ab: NEGATIVE
HCV Ab: NEGATIVE
Hep A IgM: NEGATIVE
Hep A IgM: NEGATIVE
Hep B C IgM: NEGATIVE
Hep B C IgM: NEGATIVE
Hepatitis B Surface Ag: NEGATIVE
Hepatitis B Surface Ag: NEGATIVE

## 2011-02-05 LAB — FECAL LACTOFERRIN, QUANT: Fecal Lactoferrin: POSITIVE

## 2011-02-05 LAB — URINE CULTURE
Colony Count: >=100000
Special Requests: NEGATIVE

## 2011-02-05 LAB — RETICULOCYTES
RBC.: 2.34 — ABNORMAL LOW
RBC.: 2.36 — ABNORMAL LOW
RBC.: 2.45 — ABNORMAL LOW
RBC.: 2.3 — ABNORMAL LOW
Retic Count, Absolute: 144.6
Retic Count, Absolute: 259.7 — ABNORMAL HIGH
Retic Count, Absolute: 63.7
Retic Count, Absolute: 36.8
Retic Ct Pct: 11.1 — ABNORMAL HIGH
Retic Ct Pct: 5.9 — ABNORMAL HIGH
Retic Ct Pct: 1.6

## 2011-02-05 LAB — PROTIME-INR
INR: 1.5
INR: 1.8 — ABNORMAL HIGH
INR: 2.2 — ABNORMAL HIGH
INR: 1.8 — ABNORMAL HIGH
INR: 1.8 — ABNORMAL HIGH
Prothrombin Time: 19.1 — ABNORMAL HIGH
Prothrombin Time: 20.3 — ABNORMAL HIGH
Prothrombin Time: 20.5 — ABNORMAL HIGH
Prothrombin Time: 21.6 — ABNORMAL HIGH
Prothrombin Time: 22 — ABNORMAL HIGH

## 2011-02-05 LAB — RENAL FUNCTION PANEL
Calcium: 8 — ABNORMAL LOW
Glucose, Bld: 136 — ABNORMAL HIGH
Phosphorus: 4
Sodium: 133 — ABNORMAL LOW

## 2011-02-05 LAB — AMMONIA
Ammonia: 34
Ammonia: 38 — ABNORMAL HIGH
Ammonia: 55 — ABNORMAL HIGH
Ammonia: 61 — ABNORMAL HIGH
Ammonia: 87 — ABNORMAL HIGH
Ammonia: 24

## 2011-02-05 LAB — CLOTEST (H. PYLORI), BIOPSY: Helicobacter screen: NEGATIVE

## 2011-02-05 LAB — PHOSPHORUS
Phosphorus: 4.5
Phosphorus: 6.7 — ABNORMAL HIGH
Phosphorus: 7.3 — ABNORMAL HIGH

## 2011-02-05 LAB — OCCULT BLOOD X 1 CARD TO LAB, STOOL: Fecal Occult Bld: NEGATIVE

## 2011-02-05 LAB — MAGNESIUM
Magnesium: 1.1 — ABNORMAL LOW
Magnesium: 1.2 — ABNORMAL LOW
Magnesium: 1.4 — ABNORMAL LOW
Magnesium: 1.6

## 2011-02-05 LAB — URINALYSIS, ROUTINE W REFLEX MICROSCOPIC
Glucose, UA: NEGATIVE
Nitrite: NEGATIVE
Specific Gravity, Urine: 1.016
pH: 5.5

## 2011-02-05 LAB — URINE MICROSCOPIC-ADD ON

## 2011-02-05 LAB — CULTURE, BLOOD (ROUTINE X 2)

## 2011-02-05 LAB — SODIUM, URINE, RANDOM: Sodium, Ur: 15

## 2011-02-05 LAB — HEMOGLOBIN AND HEMATOCRIT, BLOOD: Hemoglobin: 8.7 — ABNORMAL LOW

## 2011-02-05 LAB — AFP TUMOR MARKER: AFP-Tumor Marker: 2.5

## 2011-02-05 LAB — CREATININE, URINE, RANDOM: Creatinine, Urine: 61.3

## 2011-02-05 LAB — APTT
aPTT: 33
aPTT: 37

## 2011-02-05 LAB — HEPATITIS C ANTIBODY: HCV Ab: NEGATIVE

## 2011-02-05 LAB — OVA AND PARASITE EXAMINATION: Ova and parasites: NONE SEEN

## 2011-02-05 LAB — VITAMIN B12
Vitamin B-12: 1281 — ABNORMAL HIGH (ref 211–911)
Vitamin B-12: 861 (ref 211–911)

## 2011-02-05 LAB — FOLATE RBC
RBC Folate: 748 — ABNORMAL HIGH
RBC Folate: 817 — ABNORMAL HIGH

## 2011-02-05 LAB — SEDIMENTATION RATE: Sed Rate: 81 — ABNORMAL HIGH

## 2011-02-05 LAB — IGM: IgM, Serum: 310 — ABNORMAL HIGH

## 2012-10-23 ENCOUNTER — Other Ambulatory Visit: Payer: Self-pay | Admitting: Orthopedic Surgery

## 2012-10-28 NOTE — Progress Notes (Signed)
Pt aware of where to come -bring meds and insur card,ID

## 2012-11-02 NOTE — H&P (Signed)
   [name] is a 57 year-old retired gentleman who has a history of a verrucous type lesion on the radial aspect of his radial nail wall.  He has had this dbrided, frozen, injected and scraped. His biopsy results have revealed a verrucous histology.  He has persistent discomfort and is now referred for an upper extremity orthopaedic consult for this predicament.    His past medical history is reviewed in detail.  He reports that he is 5'11" tall and does not share his weight.  He is of normal weight for his height.  His medical history reveals that he has no drug allergies.  Current medications include Pindolol 10 mg., bupropion 100 mg. daily, fluoxetine 40 mg. and Abilify 5 mg..  Social history reveals that he is married, he is a nonsmoker, he enjoys a rare alcoholic beverage.  He resides with his wife and is retired.    Family history is detailed and positive for coronary artery disease affecting primary relatives.    14-point review of systems reveals history of hypertension controlled by medication.   Physical examination reveals a very pleasant 57 year-old gentleman.  Inspection of his right thumb reveals a verrucous lesion with hyperkeratosis on the radial aspect of the nail wall. There is some separation of the nail plate from the ventral nail matrix.  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   He has full range of motion of his CMC, MP and IP joints.  His neurovascular examination is intact.  His left hand examination is unremarkable.    Plain x-rays of his thumb, four views, demonstrate normal bony anatomy.   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   PLAN:   I have advised him to proceed with proper nail biopsy with aggressive debridement of his verrucous lesion. We will also perform a "auto inoculation" of his subcutaneous tissues which often can help with the  resolution of these verrucous periungual lesions.   I have told him we could never guarantee success in this setting as well cannot always control a viral infection of the skin.    We will also perform a proper biopsy to be sure he does not have a squamous cell carcinoma or a split nail remnant that is exacerbating his condition.     H&P documentation: 11/03/2012  -History and Physical Reviewed  -Patient has been re-examined  -No change in the plan of care  Wyn Forster, MD

## 2012-11-03 ENCOUNTER — Ambulatory Visit (HOSPITAL_BASED_OUTPATIENT_CLINIC_OR_DEPARTMENT_OTHER)
Admission: RE | Admit: 2012-11-03 | Discharge: 2012-11-03 | Disposition: A | Discharge status: Home / Self Care | Payer: Medicare Other | Source: Ambulatory Visit | Attending: Orthopedic Surgery | Admitting: Orthopedic Surgery

## 2012-11-03 ENCOUNTER — Encounter (HOSPITAL_BASED_OUTPATIENT_CLINIC_OR_DEPARTMENT_OTHER): Payer: Self-pay

## 2012-11-03 ENCOUNTER — Encounter (HOSPITAL_BASED_OUTPATIENT_CLINIC_OR_DEPARTMENT_OTHER)
Admission: RE | Disposition: A | Discharge status: Home / Self Care | Payer: Self-pay | Source: Ambulatory Visit | Attending: Orthopedic Surgery

## 2012-11-03 DIAGNOSIS — I10 Essential (primary) hypertension: Secondary | ICD-10-CM | POA: Insufficient documentation

## 2012-11-03 DIAGNOSIS — Z79899 Other long term (current) drug therapy: Secondary | ICD-10-CM | POA: Insufficient documentation

## 2012-11-03 DIAGNOSIS — L608 Other nail disorders: Secondary | ICD-10-CM | POA: Insufficient documentation

## 2012-11-03 DIAGNOSIS — B079 Viral wart, unspecified: Secondary | ICD-10-CM | POA: Insufficient documentation

## 2012-11-03 HISTORY — DX: Essential (primary) hypertension: I10

## 2012-11-03 HISTORY — PX: MINOR NAILBED REPAIR: SHX5979

## 2012-11-03 SURGERY — MINOR NAILBED REPAIR
Anesthesia: LOCAL | Site: Thumb | Laterality: Right | Wound class: Clean

## 2012-11-03 MED ORDER — OXYCODONE-ACETAMINOPHEN 5-325 MG PO TABS: ORAL_TABLET | ORAL | Status: DC

## 2012-11-03 MED ORDER — CHLORHEXIDINE GLUCONATE 4 % EX LIQD: 60.0000 mL | Freq: Once | CUTANEOUS | Status: DC

## 2012-11-03 MED ORDER — LIDOCAINE HCL 2 % IJ SOLN
INTRAMUSCULAR | Status: DC | PRN
  Administered 2012-11-03: 3.5 mL

## 2012-11-03 SURGICAL SUPPLY — 42 items
BANDAGE ADHESIVE 1X3 (GAUZE/BANDAGES/DRESSINGS) IMPLANT
BLADE SURG 15 STRL LF DISP TIS (BLADE) ×1 IMPLANT
BLADE SURG 15 STRL SS (BLADE) ×2
BNDG CMPR 9X4 STRL LF SNTH (GAUZE/BANDAGES/DRESSINGS)
BNDG CMPR MD 5X2 ELC HKLP STRL (GAUZE/BANDAGES/DRESSINGS)
BNDG COHESIVE 1X5 TAN STRL LF (GAUZE/BANDAGES/DRESSINGS) ×1 IMPLANT
BNDG ELASTIC 2 VLCR STRL LF (GAUZE/BANDAGES/DRESSINGS) IMPLANT
BNDG ESMARK 4X9 LF (GAUZE/BANDAGES/DRESSINGS) IMPLANT
BRUSH SCRUB EZ PLAIN DRY (MISCELLANEOUS) ×2 IMPLANT
CLOTH BEACON ORANGE TIMEOUT ST (SAFETY) ×2 IMPLANT
CORDS BIPOLAR (ELECTRODE) IMPLANT
COVER MAYO STAND STRL (DRAPES) ×2 IMPLANT
CUFF TOURNIQUET SINGLE 18IN (TOURNIQUET CUFF) IMPLANT
DECANTER SPIKE VIAL GLASS SM (MISCELLANEOUS) IMPLANT
DRAIN PENROSE 1/2X12 LTX STRL (WOUND CARE) ×1 IMPLANT
DRAPE SURG 17X23 STRL (DRAPES) ×2 IMPLANT
GAUZE SPONGE 4X4 12PLY STRL LF (GAUZE/BANDAGES/DRESSINGS) ×2 IMPLANT
GAUZE XEROFORM 1X8 LF (GAUZE/BANDAGES/DRESSINGS) ×1 IMPLANT
GLOVE BIO SURGEON STRL SZ 6.5 (GLOVE) ×2 IMPLANT
GLOVE BIOGEL M STRL SZ7.5 (GLOVE) ×1 IMPLANT
GLOVE EXAM NITRILE PF MED BLUE (GLOVE) ×1 IMPLANT
GLOVE ORTHO TXT STRL SZ7.5 (GLOVE) ×2 IMPLANT
GOWN BRE IMP PREV XXLGXLNG (GOWN DISPOSABLE) ×3 IMPLANT
GOWN PREVENTION PLUS XLARGE (GOWN DISPOSABLE) ×2 IMPLANT
NDL SAFETY ECLIPSE 18X1.5 (NEEDLE) IMPLANT
NEEDLE 27GAX1X1/2 (NEEDLE) ×2 IMPLANT
NEEDLE HYPO 18GX1.5 SHARP (NEEDLE) ×2
PACK BASIN DAY SURGERY FS (CUSTOM PROCEDURE TRAY) ×2 IMPLANT
PADDING CAST ABS 4INX4YD NS (CAST SUPPLIES)
PADDING CAST ABS COTTON 4X4 ST (CAST SUPPLIES) ×1 IMPLANT
SPONGE GAUZE 4X4 12PLY (GAUZE/BANDAGES/DRESSINGS) ×2 IMPLANT
STOCKINETTE 4X48 STRL (DRAPES) ×2 IMPLANT
STRIP CLOSURE SKIN 1/2X4 (GAUZE/BANDAGES/DRESSINGS) IMPLANT
SUT CHROMIC 5 0 P 3 (SUTURE) ×1 IMPLANT
SUT ETHILON 5 0 P 3 18 (SUTURE)
SUT NYLON ETHILON 5-0 P-3 1X18 (SUTURE) ×1 IMPLANT
SUT PROLENE 4 0 P 3 18 (SUTURE) IMPLANT
SYR 3ML 23GX1 SAFETY (SYRINGE) IMPLANT
SYR CONTROL 10ML LL (SYRINGE) ×1 IMPLANT
TOWEL OR 17X24 6PK STRL BLUE (TOWEL DISPOSABLE) ×4 IMPLANT
TRAY DSU PREP LF (CUSTOM PROCEDURE TRAY) ×2 IMPLANT
UNDERPAD 30X30 INCONTINENT (UNDERPADS AND DIAPERS) ×2 IMPLANT

## 2012-11-03 NOTE — Brief Op Note (Signed)
11/03/2012  10:36 AM  PATIENT:  [name]  57 y.o. male  PRE-OPERATIVE DIAGNOSIS:  RIGHT PERIUNGUAL WART SPLIT NAIL DEFORMITY  POST-OPERATIVE DIAGNOSIS:  RIGHT PERIUNGUAL WART SPLIT NAIL DEFORMITY  PROCEDURE:  Procedure(s): NAIL DEBRIDEMENT AND BIOPSY RIGHT THUMB NAIL (Right)  SURGEON:  Surgeon(s) and Role:    * Wyn Forster., MD - Primary  PHYSICIAN ASSISTANT:   ASSISTANTS: surgical technican   ANESTHESIA:   local  EBL:     BLOOD ADMINISTERED:none  DRAINS: none   LOCAL MEDICATIONS USED:  XYLOCAINE   SPECIMEN:  Biopsy / Limited Resection  DISPOSITION OF SPECIMEN:  PATHOLOGY  COUNTS:  YES  TOURNIQUET:   Total Tourniquet Time Documented: area (laterality) - 19 minutes Total: area (laterality) - 19 minutes   DICTATION: .Other Dictation: Dictation Number [id number]  PLAN OF CARE: Discharge to home after PACU  PATIENT DISPOSITION:  PACU - hemodynamically stable.   Delay start of Pharmacological VTE agent (>24hrs) due to surgical blood loss or risk of bleeding: not applicable

## 2012-11-03 NOTE — Op Note (Signed)
[id number] 

## 2012-11-04 NOTE — Op Note (Signed)
NAME:  [name], [name]                 ACCOUNT NO.:  [id number]  MEDICAL RECORD NO.:  [id number]  LOCATION:                               FACILITY:  [facility]  PHYSICIAN:  [name] Fitch. Tionne Dayhoff, M.D. 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  OPERATION:  Full-thickness nail mechanism biopsy including dorsal intermediate and ventral nail fold as well as nail wall right thumb followed by reconstruction of the radial nail wall.  OPERATING SURGEON:  [name], M.D.  ASSISTANT:  Surgical technician.  ANESTHESIA:  2% lidocaine metacarpal head level block of right thumb. This was performed as a minor operating room procedure.  INDICATIONS:  [name] is a 57 year old gentleman referred through the courtesy of Dr. 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  This has been treated with multiple forms of intervention including freezing, irritant therapy, injection, and serial tearing.  The lesion has continued to recur with subsequent nail deformity including a borderline split nail.  Ultimately, Dr. Londell Moh referred him for hand surgery consult for proper biopsy to be certain that this was not a low-grade squamous cell carcinoma due to its persistence.  [name] was met in the office and had detailed informed consent regarding biopsy of a  57  periungual verrucous type lesion detailed informed consent regarding biopsy of a  periungual verrucous type lesion.  I explained to him that this is typically due to a viral infection. They can be very challenging to eradicate.  We have had excellent success with inoculated individuals to their virus by passing a fine gauge needle through the lesion repeatedly into the subcutaneous tissues so as to have the virus identified by the immune system.  We will also perform a proper biopsy of the nail fold and reconstruct the nail wall.  After detailed informed consent, [name] was brought to the operating room at this time.  He understands with the goal of this procedures primarily to rule out a squamous cell carcinoma as well as other chronic skin lesion at the site of his nail deformity due to the persistence of his irritant.  Questions were invited and answered in detail.  DESCRIPTION OF PROCEDURE:  [name] was interviewed in the holding area and his proper surgical site identified per protocol with a marking pen.  We once again reviewed the anticipated procedure.  Questions were invited and answered in detail.  He understood that this was to be performed as a minor operating room procedure under straight local anesthesia.  He was escorted to room 2 of the [facility] and placed in supine position on the operating table.  Following Betadine prep, 3.5 mL of 2% lidocaine were infiltrated at  metacarpal head level to obtain a digital block.  After 5 minutes excellent anesthesia was achieved.  The right hand and arm were then prepped with Betadine soap and solution, sterilely draped with towels.  The thumb was exsanguinated by direct compression and a 0.5-inch Penrose drain was placed over gauze wrap as a digital tourniquet.  Following routine surgical time-out, we performed a test to the sensation and found him to be anesthetic.  We then elevated the radial 3 mm of the nail plate and performed a full- thickness wedge resection of the entire nail  mechanism down to level of the distal phalanx.  Great care was taken to identify the proximal dermal nail matrix including the dorsal intermediate and ventral nail fold portions.  There was quite a bit of scarring of the periosteum on the radial aspect of the thumb and loss of the normal fat pad probably due to prolonged manipulation of this region in an attempt to eradicate the work.  We subsequently used a micro rongeur to remove all the remaining matrix type material, followed by mobilization of the radial pulp of the thumb and reconstruction of the radial nail wall and dorsal nail fold with multiple trauma type sutures of 5-0 chromic.  A very cosmetic close was achieved.  We ultimately narrow the nail x 2 mm.  We explained to [name] who is fully conscious and not sedated during the procedure that our goal was to obtain a proper biopsy.  We did use the needle technique to the inoculate against the wart.  The thumb was then dressed with Xeroflo, sterile gauze, and a wrist based Coban dressing. [name] is going to keep his dressing clean and dry for 1 week.  We will see him back for followup in our office sooner than a week.  If he has difficulty the bandage. He is provided prescription for oxycodone 5 mg 1 p.o. q.4-6 hours p.r.n. pain, 24 tablets without refill.  He is also advised to use over-the- counter analgesics in the form of Aleve or ibuprofen to augment the oxycodone.  He will contact our office he has any difficulties of the bandage or other questions.     [name], M.D.     RVS/MEDQ  D:  11/03/2012  T:  11/03/2012  Job:  [id number]  cc:   [facility]

## 2012-11-05 ENCOUNTER — Encounter (HOSPITAL_BASED_OUTPATIENT_CLINIC_OR_DEPARTMENT_OTHER): Payer: Self-pay | Admitting: Orthopedic Surgery

## 2013-04-12 ENCOUNTER — Ambulatory Visit
Admission: RE | Admit: 2013-04-12 | Discharge: 2013-04-12 | Disposition: A | Discharge status: Home / Self Care | Payer: Medicare Other | Source: Ambulatory Visit | Attending: Internal Medicine | Admitting: Internal Medicine

## 2013-04-12 ENCOUNTER — Other Ambulatory Visit: Payer: Self-pay | Admitting: Internal Medicine

## 2013-04-12 DIAGNOSIS — M25432 Effusion, left wrist: Secondary | ICD-10-CM

## 2013-10-30 ENCOUNTER — Inpatient Hospital Stay (HOSPITAL_COMMUNITY)
Admission: EM | Admit: 2013-10-30 | Discharge: 2013-11-10 | DRG: 682 | Disposition: A | Discharge status: Skilled Nursing Facility | Payer: Medicare Other | Attending: Internal Medicine | Admitting: Internal Medicine

## 2013-10-30 ENCOUNTER — Encounter (HOSPITAL_COMMUNITY): Payer: Self-pay | Admitting: Emergency Medicine

## 2013-10-30 DIAGNOSIS — R197 Diarrhea, unspecified: Secondary | ICD-10-CM | POA: Diagnosis not present

## 2013-10-30 DIAGNOSIS — K859 Acute pancreatitis without necrosis or infection, unspecified: Secondary | ICD-10-CM | POA: Diagnosis present

## 2013-10-30 DIAGNOSIS — E1169 Type 2 diabetes mellitus with other specified complication: Secondary | ICD-10-CM | POA: Diagnosis present

## 2013-10-30 DIAGNOSIS — F1010 Alcohol abuse, uncomplicated: Secondary | ICD-10-CM

## 2013-10-30 DIAGNOSIS — E873 Alkalosis: Secondary | ICD-10-CM | POA: Diagnosis present

## 2013-10-30 DIAGNOSIS — F3289 Other specified depressive episodes: Secondary | ICD-10-CM

## 2013-10-30 DIAGNOSIS — I4510 Unspecified right bundle-branch block: Secondary | ICD-10-CM | POA: Diagnosis present

## 2013-10-30 DIAGNOSIS — F10239 Alcohol dependence with withdrawal, unspecified: Secondary | ICD-10-CM | POA: Diagnosis present

## 2013-10-30 DIAGNOSIS — D638 Anemia in other chronic diseases classified elsewhere: Secondary | ICD-10-CM | POA: Diagnosis present

## 2013-10-30 DIAGNOSIS — R609 Edema, unspecified: Secondary | ICD-10-CM | POA: Diagnosis present

## 2013-10-30 DIAGNOSIS — J9601 Acute respiratory failure with hypoxia: Secondary | ICD-10-CM

## 2013-10-30 DIAGNOSIS — E871 Hypo-osmolality and hyponatremia: Secondary | ICD-10-CM

## 2013-10-30 DIAGNOSIS — Z6833 Body mass index (BMI) 33.0-33.9, adult: Secondary | ICD-10-CM

## 2013-10-30 DIAGNOSIS — E872 Acidosis, unspecified: Secondary | ICD-10-CM

## 2013-10-30 DIAGNOSIS — E119 Type 2 diabetes mellitus without complications: Secondary | ICD-10-CM

## 2013-10-30 DIAGNOSIS — R279 Unspecified lack of coordination: Secondary | ICD-10-CM | POA: Diagnosis present

## 2013-10-30 DIAGNOSIS — I10 Essential (primary) hypertension: Secondary | ICD-10-CM

## 2013-10-30 DIAGNOSIS — E1149 Type 2 diabetes mellitus with other diabetic neurological complication: Secondary | ICD-10-CM

## 2013-10-30 DIAGNOSIS — F1021 Alcohol dependence, in remission: Secondary | ICD-10-CM

## 2013-10-30 DIAGNOSIS — R7989 Other specified abnormal findings of blood chemistry: Secondary | ICD-10-CM

## 2013-10-30 DIAGNOSIS — K7682 Hepatic encephalopathy: Secondary | ICD-10-CM

## 2013-10-30 DIAGNOSIS — R571 Hypovolemic shock: Secondary | ICD-10-CM

## 2013-10-30 DIAGNOSIS — J9600 Acute respiratory failure, unspecified whether with hypoxia or hypercapnia: Secondary | ICD-10-CM

## 2013-10-30 DIAGNOSIS — E46 Unspecified protein-calorie malnutrition: Secondary | ICD-10-CM | POA: Diagnosis present

## 2013-10-30 DIAGNOSIS — I4891 Unspecified atrial fibrillation: Secondary | ICD-10-CM | POA: Diagnosis not present

## 2013-10-30 DIAGNOSIS — F1020 Alcohol dependence, uncomplicated: Secondary | ICD-10-CM | POA: Diagnosis present

## 2013-10-30 DIAGNOSIS — K7030 Alcoholic cirrhosis of liver without ascites: Secondary | ICD-10-CM | POA: Diagnosis present

## 2013-10-30 DIAGNOSIS — Z23 Encounter for immunization: Secondary | ICD-10-CM

## 2013-10-30 DIAGNOSIS — R578 Other shock: Secondary | ICD-10-CM | POA: Diagnosis not present

## 2013-10-30 DIAGNOSIS — K209 Esophagitis, unspecified without bleeding: Secondary | ICD-10-CM

## 2013-10-30 DIAGNOSIS — F10939 Alcohol use, unspecified with withdrawal, unspecified: Secondary | ICD-10-CM | POA: Diagnosis present

## 2013-10-30 DIAGNOSIS — D6959 Other secondary thrombocytopenia: Secondary | ICD-10-CM | POA: Diagnosis present

## 2013-10-30 DIAGNOSIS — R34 Anuria and oliguria: Secondary | ICD-10-CM | POA: Diagnosis not present

## 2013-10-30 DIAGNOSIS — F329 Major depressive disorder, single episode, unspecified: Secondary | ICD-10-CM

## 2013-10-30 DIAGNOSIS — R945 Abnormal results of liver function studies: Secondary | ICD-10-CM

## 2013-10-30 DIAGNOSIS — Z79899 Other long term (current) drug therapy: Secondary | ICD-10-CM

## 2013-10-30 DIAGNOSIS — F10230 Alcohol dependence with withdrawal, uncomplicated: Secondary | ICD-10-CM

## 2013-10-30 DIAGNOSIS — K222 Esophageal obstruction: Secondary | ICD-10-CM

## 2013-10-30 DIAGNOSIS — E878 Other disorders of electrolyte and fluid balance, not elsewhere classified: Secondary | ICD-10-CM | POA: Diagnosis present

## 2013-10-30 DIAGNOSIS — R319 Hematuria, unspecified: Secondary | ICD-10-CM | POA: Diagnosis not present

## 2013-10-30 DIAGNOSIS — K767 Hepatorenal syndrome: Secondary | ICD-10-CM | POA: Diagnosis present

## 2013-10-30 DIAGNOSIS — N179 Acute kidney failure, unspecified: Principal | ICD-10-CM

## 2013-10-30 DIAGNOSIS — R748 Abnormal levels of other serum enzymes: Secondary | ICD-10-CM | POA: Diagnosis present

## 2013-10-30 DIAGNOSIS — K7290 Hepatic failure, unspecified without coma: Secondary | ICD-10-CM

## 2013-10-30 DIAGNOSIS — R000 Tachycardia, unspecified: Secondary | ICD-10-CM | POA: Diagnosis present

## 2013-10-30 DIAGNOSIS — F10231 Alcohol dependence with withdrawal delirium: Secondary | ICD-10-CM

## 2013-10-30 DIAGNOSIS — F10931 Alcohol use, unspecified with withdrawal delirium: Secondary | ICD-10-CM

## 2013-10-30 DIAGNOSIS — K7010 Alcoholic hepatitis without ascites: Secondary | ICD-10-CM

## 2013-10-30 DIAGNOSIS — E876 Hypokalemia: Secondary | ICD-10-CM | POA: Diagnosis not present

## 2013-10-30 DIAGNOSIS — E860 Dehydration: Secondary | ICD-10-CM

## 2013-10-30 DIAGNOSIS — F10930 Alcohol use, unspecified with withdrawal, uncomplicated: Secondary | ICD-10-CM

## 2013-10-30 DIAGNOSIS — R509 Fever, unspecified: Secondary | ICD-10-CM

## 2013-10-30 DIAGNOSIS — R064 Hyperventilation: Secondary | ICD-10-CM | POA: Diagnosis not present

## 2013-10-30 HISTORY — DX: Depression, unspecified: F32.A

## 2013-10-30 HISTORY — DX: Major depressive disorder, single episode, unspecified: F32.9

## 2013-10-30 HISTORY — DX: Alcohol abuse, uncomplicated: F10.10

## 2013-10-30 LAB — COMPREHENSIVE METABOLIC PANEL
ALBUMIN: 3.7 g/dL (ref 3.5–5.2)
ALK PHOS: 176 U/L — AB (ref 39–117)
ALT: 351 U/L — AB (ref 0–53)
AST: 450 U/L — AB (ref 0–37)
BILIRUBIN TOTAL: 4.4 mg/dL — AB (ref 0.3–1.2)
BUN: 17 mg/dL (ref 6–23)
CHLORIDE: 76 meq/L — AB (ref 96–112)
CO2: 14 meq/L — AB (ref 19–32)
CREATININE: 1.88 mg/dL — AB (ref 0.50–1.35)
Calcium: 9.5 mg/dL (ref 8.4–10.5)
GFR calc Af Amer: 44 mL/min — ABNORMAL LOW (ref >90)
GFR, EST NON AFRICAN AMERICAN: 38 mL/min — AB (ref >90)
Glucose, Bld: 145 mg/dL — ABNORMAL HIGH (ref 70–99)
POTASSIUM: 4 meq/L (ref 3.7–5.3)
SODIUM: 125 meq/L — AB (ref 137–147)
Total Protein: 7.3 g/dL (ref 6.0–8.3)

## 2013-10-30 LAB — CBC
HCT: 37.8 % — ABNORMAL LOW (ref 39.0–52.0)
Hemoglobin: 13.5 g/dL (ref 13.0–17.0)
MCH: 34.3 pg — AB (ref 26.0–34.0)
MCHC: 35.7 g/dL (ref 30.0–36.0)
MCV: 95.9 fL (ref 78.0–100.0)
Platelets: 145 10*3/uL — ABNORMAL LOW (ref 150–400)
RBC: 3.94 MIL/uL — AB (ref 4.22–5.81)
RDW: 13.7 % (ref 11.5–15.5)
WBC: 12.8 10*3/uL — AB (ref 4.0–10.5)

## 2013-10-30 LAB — ETHANOL: ALCOHOL ETHYL (B): 25 mg/dL — AB (ref 0–11)

## 2013-10-30 LAB — RAPID URINE DRUG SCREEN, HOSP PERFORMED
Amphetamines: NOT DETECTED
BARBITURATES: NOT DETECTED
Benzodiazepines: NOT DETECTED
Cocaine: NOT DETECTED
Opiates: NOT DETECTED
TETRAHYDROCANNABINOL: NOT DETECTED

## 2013-10-30 MED ORDER — IBUPROFEN 200 MG PO TABS
600.0000 mg | ORAL_TABLET | Freq: Three times a day (TID) | ORAL | Status: DC | PRN
  Administered 2013-10-31: 600 mg via ORAL
  Filled 2013-10-30: qty 3
  Filled 2013-10-30: qty 1

## 2013-10-30 MED ORDER — ONDANSETRON HCL 4 MG PO TABS: 4.0000 mg | ORAL_TABLET | Freq: Three times a day (TID) | ORAL | Status: DC | PRN

## 2013-10-30 MED ORDER — ACETAMINOPHEN 325 MG PO TABS
650.0000 mg | ORAL_TABLET | ORAL | Status: DC | PRN
  Administered 2013-11-02: 650 mg via ORAL
  Filled 2013-10-30: qty 2

## 2013-10-30 MED ORDER — SODIUM CHLORIDE 0.9 % IV SOLN
1000.0000 mL | INTRAVENOUS | Status: DC
  Administered 2013-10-31 – 2013-11-01 (×3): 1000 mL via INTRAVENOUS

## 2013-10-30 MED ORDER — METOPROLOL SUCCINATE ER 25 MG PO TB24
50.0000 mg | ORAL_TABLET | Freq: Every day | ORAL | Status: DC
  Administered 2013-10-30: 50 mg via ORAL
  Filled 2013-10-30 (×3): qty 1

## 2013-10-30 MED ORDER — ZOLPIDEM TARTRATE 5 MG PO TABS
5.0000 mg | ORAL_TABLET | Freq: Every evening | ORAL | Status: DC | PRN
  Administered 2013-10-31 (×2): 5 mg via ORAL
  Filled 2013-10-30 (×2): qty 1

## 2013-10-30 MED ORDER — VITAMIN B-1 100 MG PO TABS
100.0000 mg | ORAL_TABLET | Freq: Every day | ORAL | Status: DC
  Administered 2013-10-30 – 2013-11-10 (×9): 100 mg via ORAL
  Filled 2013-10-30 (×11): qty 1

## 2013-10-30 MED ORDER — BUPROPION HCL ER (SR) 100 MG PO TB12
100.0000 mg | ORAL_TABLET | Freq: Every day | ORAL | Status: DC
  Administered 2013-10-30 – 2013-11-01 (×3): 100 mg via ORAL
  Filled 2013-10-30 (×4): qty 1

## 2013-10-30 MED ORDER — SODIUM CHLORIDE 0.9 % IV SOLN
1000.0000 mL | Freq: Once | INTRAVENOUS | Status: AC
  Administered 2013-10-30: 1000 mL via INTRAVENOUS

## 2013-10-30 MED ORDER — FLUOXETINE HCL 20 MG PO CAPS
40.0000 mg | ORAL_CAPSULE | Freq: Every day | ORAL | Status: DC
  Administered 2013-10-30 – 2013-11-01 (×3): 40 mg via ORAL
  Filled 2013-10-30 (×4): qty 2

## 2013-10-30 MED ORDER — LORAZEPAM 1 MG PO TABS: 0.0000 mg | ORAL_TABLET | Freq: Two times a day (BID) | ORAL | Status: DC

## 2013-10-30 MED ORDER — LORAZEPAM 1 MG PO TABS
0.0000 mg | ORAL_TABLET | Freq: Four times a day (QID) | ORAL | Status: DC
  Administered 2013-10-30: 1 mg via ORAL
  Administered 2013-10-31: 2 mg via ORAL
  Administered 2013-10-31 – 2013-11-01 (×2): 1 mg via ORAL
  Filled 2013-10-30: qty 1
  Filled 2013-10-30: qty 2
  Filled 2013-10-30 (×2): qty 1

## 2013-10-30 MED ORDER — THIAMINE HCL 100 MG/ML IJ SOLN
100.0000 mg | Freq: Every day | INTRAMUSCULAR | Status: DC
  Administered 2013-11-01 – 2013-11-03 (×3): 100 mg via INTRAVENOUS
  Filled 2013-10-30: qty 1
  Filled 2013-10-30 (×2): qty 2
  Filled 2013-10-30: qty 1
  Filled 2013-10-30: qty 2

## 2013-10-30 NOTE — ED Notes (Signed)
Patient is requesting detox from alcohol. Patient states he drank 3 glasses of wine at 0900 today. Patient states he drink 2 or more bottles of wine daily. Patient denies any other drug use. Patient denies any auditory or visual hallucinations. Patient denies SI/HI.

## 2013-10-30 NOTE — ED Notes (Signed)
Last drink this a.m.

## 2013-10-30 NOTE — ED Notes (Signed)
MD Campos at bedside.  

## 2013-10-30 NOTE — BH Assessment (Addendum)
Assessment Note  [name] is an 58 y.o. male who presents at [facility] with his wife for treatment for alcohol abuse.  Pt says he drinks a bottle of wine daily and has done this for years with one year of sobriety in the past.  He has had treatment at [facility] in the past, but has no OP providers.  Pt has no history of seizures with detox or otherwise.  Pt denies depression, but has been grieving the loss of his father who died 2 months ago, and [place]  Pt denies SI (no history of any attempts), HI, history of violence, A/V hallucinations. Pt lives at home with his wife, who is supportive and wants him to get treatment.  Pt says he has reduced sleep (5 hrs/ night), and reduced appetite, but has not lost weight.  Pt is disheveled during assessment, with slow speech. Pt is calm and cooperative with good eye contact.  Pt says he has not been able to walk well (uses a cane) for the past few weeks, and he does not know why, but attributes it to alcohol.   Disposition pending further medical clearance, but IP detox is recommended by Nanine Means, NP.   Axis I: Alcohol Abuse Axis II: Deferred Axis III:  Past Medical History  Diagnosis Date  . Hypertension   . Alcohol abuse    Axis IV: death of father Axis V: 41-50 serious symptoms  Past Medical History:  Past Medical History  Diagnosis Date  . Hypertension   . Alcohol abuse     Past Surgical History  Procedure Laterality Date  . Back surgery    . Knnes time 2    . Minor nailbed repair Right 11/03/2012    Procedure: NAIL DEBRIDEMENT AND BIOPSY RIGHT THUMB NAIL;  Surgeon: Wyn Forster., MD;  Location:  SUR[facility]ERY [facility];  Service: Orthopedics;  Laterality: Right;    Family History: History reviewed. No pertinent family history.  Social History:  reports that he has never smoked. He has never used smokeless tobacco.  He reports that he drinks alcohol. He reports that he does not use illicit drugs.  Additional Social History:  Alcohol / Drug Use Pain Medications: denies Prescriptions: denies Over the Counter: denies History of alcohol / drug use?: Yes Longest period of sobriety (when/how long): 1 year Negative Consequences of Use: Personal relationships Withdrawal Symptoms:  (denies) Substance #1 Name of Substance 1: alcohol 1 - Age of First Use: 15 1 - Amount (size/oz): bottle of wine 1 - Frequency: daily 1 - Duration: years off and on 1 - Last Use / Amount: this am 4 glasses  CIWA: CIWA-Ar BP: 122/85 mmHg Pulse Rate: 113 Nausea and Vomiting: no nausea and no vomiting Tactile Disturbances: none Tremor: five Auditory Disturbances: not present Paroxysmal Sweats: barely perceptible sweating, palms moist Visual Disturbances: not present Anxiety: mildly anxious Headache, Fullness in Head: none present Agitation: normal activity Orientation and Clouding of Sensorium: oriented and can do serial additions CIWA-Ar Total: 7 COWS:    Allergies: No Known Allergies  Home Medications:  (Not in a hospital admission)  OB/GYN Status:  No LMP for male patient.  General Assessment Data Location of Assessment: [facility] ED Is this a Tele or Face-to-Face Assessment?: Face-to-Face Is this an Initial Assessment or a Re-assessment for this encounter?: Initial Assessment Living Arrangements:  Spouse/significant other Can pt return to current living arrangement?: Yes Admission Status: Voluntary Is patient capable of signing voluntary admission?: Yes Transfer from: Home Referral Source: Self/Family/Friend     [facility] Crisis Care Plan Living Arrangements: Spouse/significant other  Education Status Is patient currently in school?: No  Risk to self Suicidal Ideation: No Suicidal Intent: No Is patient at risk for suicide?: No Suicidal Plan?: No Access to Means: No What has been your use of drugs/alcohol  within the last 12 months?:  (alcohol-4 glasses of wine this am) Previous Attempts/Gestures: No Other Self Harm Risks: none Family Suicide History: No Recent stressful life event(s): Loss (Comment) (pt's dad died 2 months ago) Persecutory voices/beliefs?: No Depression: No Depression Symptoms: Insomnia Substance abuse history and/or treatment for substance abuse?: Yes Suicide prevention information given to non-admitted patients: Not applicable  Risk to Others Homicidal Ideation: No Thoughts of Harm to Others: No Current Homicidal Intent: No Current Homicidal Plan: No Access to Homicidal Means: No History of harm to others?: No Assessment of Violence: None Noted Does patient have access to weapons?: Yes (Comment) (guns) Criminal Charges Pending?: No Does patient have a court date: No  Psychosis Hallucinations: None noted Delusions: None noted  Mental Status Report Appear/Hygiene: Disheveled Eye Contact: Good Motor Activity: Psychomotor retardation Speech: Slow;Logical/coherent Level of Consciousness: Alert Mood: Sad Affect: Depressed;Appropriate to circumstance Anxiety Level: None Thought Processes: Coherent;Relevant Judgement: Partial Orientation: Person;Place;Situation Obsessive Compulsive Thoughts/Behaviors: None  Cognitive Functioning Concentration: Decreased Memory: Remote Intact;Recent Impaired IQ: Average Insight: Fair Impulse Control: Fair Appetite: Poor Weight Loss: 0 Weight Gain: 0 Sleep: Decreased Total Hours of Sleep: 5 Vegetative Symptoms: Decreased grooming  ADLScreening [facility] Patient's cognitive ability adequate to safely complete daily activities?: Yes Patient able to express need for assistance with ADLs?: Yes Independently performs ADLs?: No  Prior Inpatient Therapy Prior Inpatient Therapy: Yes Prior Therapy Dates:  (a couple of years ago) Prior Therapy Facilty/Provider(s):  ([facility]) Reason for  Treatment:  (detox)  Prior Outpatient Therapy Prior Outpatient Therapy: No  ADL Screening (condition at time of admission) Patient's cognitive ability adequate to safely complete daily activities?: Yes Is the patient deaf or have difficulty hearing?: No Does the patient have difficulty seeing, even when wearing glasses/contacts?: No Does the patient have difficulty concentrating, remembering, or making decisions?: No Patient able to express need for assistance with ADLs?: Yes Does the patient have difficulty dressing or bathing?: Yes Independently performs ADLs?: No Communication: Independent Dressing (OT): Independent with device (comment) (needs cane for past 3 weeks) Grooming: Independent with device (comment) (needs cane for past 3 weeks) Feeding: Independent Bathing: Independent with device (comment) (needs cane for past 3 weeks) Toileting: Independent with device (comment) (needs cane for past 3 weeks) In/Out Bed: Independent with device (comment) (needs cane for past 3 weeks) Walks in Home: Independent with device (comment) (needs cane for past 3 weeks) Does the patient have difficulty walking or climbing stairs?: Yes  Home Assistive Devices/Equipment Home Assistive Devices/Equipment: Cane (specify quad or straight)    Abuse/Neglect Assessment (Assessment to be complete while patient is alone) Physical Abuse: Denies Verbal Abuse: Denies Sexual Abuse: Denies Exploitation of patient/patient's resources: Denies Self-Neglect: Denies Values / Beliefs Cultural Requests During Hospitalization: None Spiritual Requests During Hospitalization: None   Advance Directives (For Healthcare) Advance Directive: Patient does not have advance directive Pre-existing out of facility DNR order (yellow form or pink MOST form): No    Additional Information 1:1 In Past 12 Months?: No CIRT Risk: No Elopement  Risk: No Does patient have medical clearance?: No     Disposition:   Disposition Initial Assessment Completed for this Encounter: Yes Disposition of Patient: Inpatient treatment program  On Site Evaluation by:   Reviewed with Physician:    Theo DillsHull,Emily Hines 10/30/2013 6:04 PM

## 2013-10-30 NOTE — ED Notes (Signed)
Bed: [facility] Expected date:  Expected time:  Means of arrival:  Comments: 

## 2013-10-30 NOTE — ED Provider Notes (Signed)
CSN: [id number]     Arrival date & time 10/30/13  1408 History   First MD Initiated Contact with Patient 10/30/13 1504     Chief Complaint  Patient presents with  . alcohol detox      (Consider location/radiation/quality/duration/timing/severity/associated sxs/prior Treatment) The history is provided by the patient.  pt requests assistance with ETOH abuse. Drinks a large bottle of wine daily. Brought by wife. Worsening drinking over the past several months. Home and family stressors. No HI or SI. No other drugs. Symptoms are mild. No hallucinations or agitation at this time. No seizures. Last drink was this AM  Past Medical History  Diagnosis Date  . Hypertension   . Alcohol abuse    Past Surgical History  Procedure Laterality Date  . Back surgery    . Knnes time 2    . Minor nailbed repair Right 11/03/2012    Procedure: NAIL DEBRIDEMENT AND BIOPSY RIGHT THUMB NAIL;  Surgeon: Wyn Forsterobert V Sypher Jr., MD;  Location: [facility];  Service: Orthopedics;  Laterality: Right;   History reviewed. No pertinent family history. History  Substance Use Topics  . Smoking status: Never Smoker   . Smokeless tobacco: Never Used  . Alcohol Use: Yes     Comment: 2 or more bottles of wine per day    Review of Systems  All other systems reviewed and are negative.     Allergies  Review of patient's allergies indicates no known allergies.  Home Medications   Prior to Admission medications   Medication Sig Start Date End Date Taking? Authorizing Provider  buPROPion (WELLBUTRIN SR) 100 MG 12 hr tablet Take 100 mg by mouth daily.    Yes Historical Provider, MD  FLUoxetine (PROZAC) 40 MG capsule Take 40 mg by mouth daily.   Yes Historical Provider, MD  metoprolol succinate (TOPROL-XL) 50 MG 24 hr tablet Take 50 mg by mouth daily. Take with or immediately following a meal.   Yes Historical Provider, MD  OVER THE COUNTER MEDICATION Take 2 tablets by mouth daily. Anti liver pills   Yes  Historical Provider, MD  zolpidem (AMBIEN CR) 12.5 MG CR tablet Take 12.5 mg by mouth at bedtime as needed for sleep.   Yes Historical Provider, MD   BP 122/85  Pulse 132  Temp(Src) 99.1 F (37.3 C) (Oral)  Resp 16  SpO2 99% Physical Exam  Nursing note and vitals reviewed. Constitutional: He is oriented to person, place, and time. He appears well-developed and well-nourished.  HENT:  Head: Normocephalic and atraumatic.  Eyes: EOM are normal.  Neck: Normal range of motion.  Cardiovascular: Regular rhythm, normal heart sounds and intact distal pulses.   Tachycardia  Pulmonary/Chest: Effort normal and breath sounds normal. No respiratory distress.  Abdominal: Soft. He exhibits no distension. There is no tenderness.  Musculoskeletal: Normal range of motion.  Neurological: He is alert and oriented to person, place, and time.  Skin: Skin is warm and dry.  Psychiatric: He has a normal mood and affect. Judgment normal.    ED Course  Procedures (including critical care time) Labs Review Labs Reviewed  COMPREHENSIVE METABOLIC PANEL  URINE RAPID DRUG SCREEN (HOSP PERFORMED)  ETHANOL  CBC    Imaging Review No results found.   EKG Interpretation None      MDM   Final diagnoses:  Alcohol abuse    Oral hydration. CIWA protocol. HR noted. Will monitor closely. TTS evaluation. voluntary    Lyanne CoKevin M Campos, MD 10/30/13 [phone]

## 2013-10-30 NOTE — ED Notes (Signed)
Pt attempted to provide a urine sample but was unable to provide. Will attempt later. Water provided.

## 2013-10-31 ENCOUNTER — Encounter (HOSPITAL_COMMUNITY): Payer: Self-pay | Admitting: *Deleted

## 2013-10-31 ENCOUNTER — Other Ambulatory Visit: Payer: Self-pay

## 2013-10-31 ENCOUNTER — Observation Stay (HOSPITAL_COMMUNITY): Payer: Medicare Other

## 2013-10-31 DIAGNOSIS — F10939 Alcohol use, unspecified with withdrawal, unspecified: Secondary | ICD-10-CM | POA: Diagnosis present

## 2013-10-31 DIAGNOSIS — E860 Dehydration: Secondary | ICD-10-CM

## 2013-10-31 DIAGNOSIS — F1010 Alcohol abuse, uncomplicated: Secondary | ICD-10-CM | POA: Diagnosis not present

## 2013-10-31 DIAGNOSIS — E871 Hypo-osmolality and hyponatremia: Secondary | ICD-10-CM | POA: Diagnosis not present

## 2013-10-31 DIAGNOSIS — R7989 Other specified abnormal findings of blood chemistry: Secondary | ICD-10-CM

## 2013-10-31 DIAGNOSIS — F10239 Alcohol dependence with withdrawal, unspecified: Secondary | ICD-10-CM | POA: Diagnosis present

## 2013-10-31 DIAGNOSIS — E872 Acidosis, unspecified: Secondary | ICD-10-CM | POA: Diagnosis present

## 2013-10-31 DIAGNOSIS — N179 Acute kidney failure, unspecified: Secondary | ICD-10-CM | POA: Diagnosis present

## 2013-10-31 DIAGNOSIS — K7010 Alcoholic hepatitis without ascites: Secondary | ICD-10-CM

## 2013-10-31 DIAGNOSIS — R945 Abnormal results of liver function studies: Secondary | ICD-10-CM

## 2013-10-31 LAB — BASIC METABOLIC PANEL
BUN: 19 mg/dL (ref 6–23)
CO2: 11 mEq/L — ABNORMAL LOW (ref 19–32)
CREATININE: 2.07 mg/dL — AB (ref 0.50–1.35)
Calcium: 8.1 mg/dL — ABNORMAL LOW (ref 8.4–10.5)
Chloride: 78 mEq/L — ABNORMAL LOW (ref 96–112)
GFR calc non Af Amer: 34 mL/min — ABNORMAL LOW (ref >90)
GFR, EST AFRICAN AMERICAN: 39 mL/min — AB (ref >90)
Glucose, Bld: 142 mg/dL — ABNORMAL HIGH (ref 70–99)
POTASSIUM: 3.8 meq/L (ref 3.7–5.3)
Sodium: 121 mEq/L — CL (ref 137–147)

## 2013-10-31 LAB — GLUCOSE, CAPILLARY
GLUCOSE-CAPILLARY: 107 mg/dL — AB (ref 70–99)
Glucose-Capillary: 121 mg/dL — ABNORMAL HIGH (ref 70–99)

## 2013-10-31 LAB — AMMONIA: AMMONIA: 28 umol/L (ref 11–60)

## 2013-10-31 MED ORDER — THIAMINE HCL 100 MG/ML IJ SOLN
Freq: Once | INTRAVENOUS | Status: AC
  Administered 2013-10-31: 12:00:00 via INTRAVENOUS
  Filled 2013-10-31: qty 1000

## 2013-10-31 MED ORDER — PNEUMOCOCCAL VAC POLYVALENT 25 MCG/0.5ML IJ INJ
0.5000 mL | INJECTION | INTRAMUSCULAR | Status: AC
Start: 2013-11-01 — End: 2013-11-02
  Administered 2013-11-02: 0.5 mL via INTRAMUSCULAR
  Filled 2013-10-31 (×3): qty 0.5

## 2013-10-31 MED ORDER — INSULIN ASPART 100 UNIT/ML ~~LOC~~ SOLN
0.0000 [IU] | Freq: Three times a day (TID) | SUBCUTANEOUS | Status: DC
  Administered 2013-11-01: 1 [IU] via SUBCUTANEOUS

## 2013-10-31 MED ORDER — ENOXAPARIN SODIUM 40 MG/0.4ML ~~LOC~~ SOLN
40.0000 mg | Freq: Every day | SUBCUTANEOUS | Status: DC
  Administered 2013-10-31 – 2013-11-05 (×6): 40 mg via SUBCUTANEOUS
  Filled 2013-10-31 (×6): qty 0.4

## 2013-10-31 MED ORDER — DEXTROSE-NACL 5-0.45 % IV SOLN: INTRAVENOUS | Status: DC

## 2013-10-31 NOTE — H&P (Signed)
[facility] History and Physical  [name] MilchDavid C [name] [id number] DOB: [birth year] DOA: 10/30/2013  Referring physician: EDP PCP: [name]   Chief Complaint: brought in for alcohol detox, was found tohave electrolyte abnormalities.   HPI: [name] MilchDavid C [name] is a 58 y.o. male with prior h/o hypertension, dm, alcohol abuse, came in to the hospital for alcohol detox. Patient appears confused and most of the history was available from the patients wife. As per the wife, pt has a h/o alcohol abuse for more than 30 years,. patien'ts father died 2 months ago and since then patient has been depressed and drinking more. No suicidal ideations as per the wife . He was evaluated yesterday by EDP and [facility] when he was found to have hyponatremia and metabolic acidosis . Hence he got referred to medical service for evaluation and management of the above.    Review of Systems:  Could not be obtained.   Past Medical History  Diagnosis Date  . Hypertension   . Alcohol abuse   . Diabetes mellitus without complication    Past Surgical History  Procedure Laterality Date  . Back surgery    . Knnes time 2    . Minor nailbed repair Right 11/03/2012    Procedure: NAIL DEBRIDEMENT AND BIOPSY RIGHT THUMB NAIL;  Surgeon: Wyn Forsterobert V Sypher Jr., [name];  Location: [facility];  Service: Orthopedics;  Laterality: Right;   Social History:  reports that he has never smoked. He has never used smokeless tobacco. He reports that he drinks about 2.4 ounces of alcohol per week. He reports that he does not use illicit drugs.  No Known Allergies  History reviewed. No pertinent family history.   Prior to Admission medications   Medication Sig Start Date End Date Taking? Authorizing Provider  buPROPion (WELLBUTRIN SR) 100 MG 12 hr tablet Take 100 mg by mouth daily.    Yes Historical Provider, [name]  FLUoxetine (PROZAC) 40 MG capsule Take 40 mg by mouth daily.   Yes  Historical Provider, [name]  metoprolol succinate (TOPROL-XL) 50 MG 24 hr tablet Take 50 mg by mouth daily. Take with or immediately following a meal.   Yes Historical Provider, [name]  OVER THE COUNTER MEDICATION Take 2 tablets by mouth daily. Anti liver pills   Yes Historical Provider, [name]  zolpidem (AMBIEN CR) 12.5 MG CR tablet Take 12.5 mg by mouth at bedtime as needed for sleep.   Yes Historical Provider, [name]   Physical Exam: Filed Vitals:   10/31/13 1214  BP: 122/66  Pulse: 103  Temp: 98.4 F (36.9 C)  Resp: 18    BP 122/66  Pulse 103  Temp(Src) 98.4 F (36.9 C) (Oral)  Resp 18  Ht 5\' 11"  (1.803 m)  Wt 108.9 kg (240 lb 1.3 oz)  BMI 33.50 kg/m2  SpO2 98%  General:  Appears calm and comfortable Eyes: PERRL, normal lids, irises & conjunctiva Neck: no LAD, masses or thyromegaly Cardiovascular: RRR, no m/r/g. No LE edema. Respiratory: CTA bilaterally, no w/r/r. Normal respiratory effort. Abdomen: soft, ntnd Skin: no rash or induration seen on limited exam Musculoskeletal: grossly normal tone BUE/BLE Neurologic: SLOW speech, able to move all extremities.           Labs on Admission:  Basic Metabolic Panel:  Recent Labs Lab 10/30/13 1527 10/31/13 1118  NA 125* 121*  K 4.0 3.8  CL 76* 78*  CO2 14* 11*  GLUCOSE 145* 142*  BUN  17 19  CREATININE 1.88* 2.07*  CALCIUM 9.5 8.1*   Liver Function Tests:  Recent Labs Lab 10/30/13 1527  AST 450*  ALT 351*  ALKPHOS 176*  BILITOT 4.4*  PROT 7.3  ALBUMIN 3.7   No results found for this basename: LIPASE, AMYLASE,  in the last 168 hours No results found for this basename: AMMONIA,  in the last 168 hours CBC:  Recent Labs Lab 10/30/13 1527  WBC 12.8*  HGB 13.5  HCT 37.8*  MCV 95.9  PLT 145*   Cardiac Enzymes: No results found for this basename: CKTOTAL, CKMB, CKMBINDEX, TROPONINI,  in the last 168 hours  BNP (last 3 results) No results found for this basename: PROBNP,  in the last 8760 hours CBG: No results  found for this basename: GLUCAP,  in the last 168 hours  Radiological Exams on Admission: No results found.  EKG: pending.   Assessment/Plan Active Problems:   DIABETES MELLITUS   Morbid obesity   DEPRESSIVE DISORDER NOT ELSEWHERE CLASSIFIED   HYPERTENSION   Acute alcoholic hepatitis   ALCOHOL ABUSE, HX OF   Alcohol withdrawal   Hyponatremia   Elevated liver function tests   Metabolic acidosis   Acute renal failure   Acute renal failure with metabolic acidosis: - dehydration and alcohol abuse, fluids and repeat renal parameters in am.   Hyponatremia - possibly from the alcohol abuse. Gentle hydration and repeat labs in am. Also sent the work up for siadh.   Elevated liver function tests: - possibly from the alcoholic hepatitis. US abdomen ordered to evaluate for cirrhosis.   Alcohol abuse and withdrawal; - CIWA ON board. psychaitry consulted and he was cleared. He does not require inpatient hospitalization.  - monitor for withdrawls.   Depression: - no suicidal ideation  Confusion, acute encephalopathy: - possibly metabolic , from renal failure, metabolic acidosis and alcohol abuse. Will get ammonia levels to evaluate for hepatic encephalopathy.   DVT prophylaxis.    Code Status: full code.  Family Communication: discussed the plan of care witht he patient Disposition Plan: admit to telemetry.   Time spent: 65 MINUTES.   [facility] AuroraKULA,Shaena Parkerson [facility] Pager [phone]  **Disclaimer: This note may have been dictated with voice recognition software. Similar sounding words can inadvertently be transcribed and this note may contain transcription errors which may not have been corrected upon publication of note.**

## 2013-10-31 NOTE — ED Provider Notes (Addendum)
Pt awaiting psych placement for alcohol detox.  Filed Vitals:   10/31/13 0800  BP: 112/66  Pulse: 95  Temp:   Resp:     Tachycardia has decreased.  Vitals stable.  Continue to monitor  Linwood DibblesJon Knapp, MD 10/31/13 [phone]  Pt was assessed by Psych team.  Pt's electrolytes are abnormal.  Hyponatremia, hypochloremia, decreased bicarb.  Rec medical admission.    Linwood DibblesJon Knapp, MD 10/31/13 1047

## 2013-10-31 NOTE — Consult Note (Signed)
[place] [facility] Consult   Reason for Consult:   Altered mental status  Referring Physician:  EDP  [name] is an 58 y.o. male. Total Time spent with patient: 30 minutes  Assessment: AXIS I:  Alcohol Abuse and Hpypnatremia AXIS II:  Alcohol Abuse and Hpypnatremia AXIS III:   Past Medical History  Diagnosis Date  . Hypertension   . Alcohol abuse   . Diabetes mellitus without complication    AXIS IV:  other psychosocial or environmental problems AXIS V:  41-50 serious symptoms  Plan:  No evidence of imminent risk to self or others at present.   Patient does not meet criteria for psychiatric inpatient admission.  Subjective:   [name] is a 58 y.o. male.  HPI:  Patient states that he has been drinking more since the death of his father a couple months ago.  Patient states that his last drink was 12 PM yesterday.  Patient denies history of seizure.  Patient states that he has been in hospital before for alcohol detox "3 yrs ago at [facility]. Discussed with patient that he had some abnormal labs and would speak with the EDP. Patient will be medically admitted and psychiatrist consult can be entered to follow once admitted. Consulted with Dr. Acquanetta Belling related to patient Sodium.  HPI Elements:   Location:  Alcohol abuse. Quality:  Depression. Severity:  increase in alcohol intake after fathers death. Timing:  Couple of months.  Past Psychiatric History: Past Medical History  Diagnosis Date  . Hypertension   . Alcohol abuse   . Diabetes mellitus without complication     reports that he has never smoked. He has never used smokeless tobacco. He reports that he drinks about 2.4 ounces of alcohol per week. He reports that he does not use illicit drugs. History reviewed. No pertinent family history. Family History Substance Abuse: No Family Supports: Yes, List: (wife) Living Arrangements: Spouse/significant other Can pt return to current living  arrangement?: Yes Abuse/Neglect [facility]) Physical Abuse: Denies Verbal Abuse: Denies Sexual Abuse: Denies Allergies:  No Known Allergies  ACT Assessment Complete:  Yes:    Educational Status    Risk to Self: Risk to self Suicidal Ideation: No Suicidal Intent: No Is patient at risk for suicide?: No Suicidal Plan?: No Access to Means: No What has been your use of drugs/alcohol within the last 12 months?:  (alcohol-4 glasses of wine this am) Previous Attempts/Gestures: No Other Self Harm Risks: none Family Suicide History: No Recent stressful life event(s): Loss (Comment) (pt's dad died 2 months ago) Persecutory voices/beliefs?: No Depression: No Depression Symptoms: Insomnia Substance abuse history and/or treatment for substance abuse?: No Suicide prevention information given to non-admitted patients: Not applicable  Risk to Others: Risk to Others Homicidal Ideation: No Thoughts of Harm to Others: No Current Homicidal Intent: No Current Homicidal Plan: No Access to Homicidal Means: No History of harm to others?: No Assessment of Violence: None Noted Does patient have access to weapons?: Yes (Comment) (guns) Criminal Charges Pending?: No Does patient have a court date: No  Abuse: Abuse/Neglect Assessment (Assessment to be complete while patient is alone) Physical Abuse: Denies Verbal Abuse: Denies Sexual Abuse: Denies Exploitation of patient/patient's resources: Denies Self-Neglect: Denies  Prior Inpatient Therapy: Prior Inpatient Therapy Prior Inpatient Therapy: Yes Prior Therapy Dates:  (a couple of years ago) Prior Therapy Facilty/Provider(s):  [facility]) Reason for Treatment:  (detox)  Prior Outpatient Therapy: Prior Outpatient Therapy Prior Outpatient Therapy: No  Additional Information: Additional Information 1:1 In Past 12 Months?: No CIRT Risk: No Elopement Risk: No Does patient have medical clearance?: No                   Objective: Blood pressure 122/66, pulse 103, temperature 98.4 F (36.9 C), temperature source Oral, resp. rate 18, height 5' 11"  (1.803 m), weight 108.9 kg (240 lb 1.3 oz), SpO2 98.00%.Body mass index is 33.5 kg/(m^2). Results for orders placed during the hospital encounter of 10/30/13 (from the past 72 hour(s))  COMPREHENSIVE METABOLIC PANEL     Status: Abnormal   Collection Time    10/30/13  3:27 PM      Result Value Ref Range   Sodium 125 (*) 137 - 147 mEq/L   Potassium 4.0  3.7 - 5.3 mEq/L   Chloride 76 (*) 96 - 112 mEq/L   CO2 14 (*) 19 - 32 mEq/L   Glucose, Bld 145 (*) 70 - 99 mg/dL   BUN 17  6 - 23 mg/dL   Creatinine, Ser 1.88 (*) 0.50 - 1.35 mg/dL   Calcium 9.5  8.4 - 10.5 mg/dL   Total Protein 7.3  6.0 - 8.3 g/dL   Albumin 3.7  3.5 - 5.2 g/dL   AST 450 (*) 0 - 37 U/L   ALT 351 (*) 0 - 53 U/L   Alkaline Phosphatase 176 (*) 39 - 117 U/L   Total Bilirubin 4.4 (*) 0.3 - 1.2 mg/dL   GFR calc non Af Amer 38 (*) >90 mL/min   GFR calc Af Amer 44 (*) >90 mL/min   Comment: (NOTE)     The eGFR has been calculated using the CKD EPI equation.     This calculation has not been validated in all clinical situations.     eGFR's persistently <90 mL/min signify possible Chronic Kidney     Disease.  ETHANOL     Status: Abnormal   Collection Time    10/30/13  3:27 PM      Result Value Ref Range   Alcohol, Ethyl (B) 25 (*) 0 - 11 mg/dL   Comment:            LOWEST DETECTABLE LIMIT FOR     SERUM ALCOHOL IS 11 mg/dL     FOR MEDICAL PURPOSES ONLY  CBC     Status: Abnormal   Collection Time    10/30/13  3:27 PM      Result Value Ref Range   WBC 12.8 (*) 4.0 - 10.5 K/uL   RBC 3.94 (*) 4.22 - 5.81 MIL/uL   Hemoglobin 13.5  13.0 - 17.0 g/dL   HCT 37.8 (*) 39.0 - 52.0 %   MCV 95.9  78.0 - 100.0 fL   MCH 34.3 (*) 26.0 - 34.0 pg   MCHC 35.7  30.0 - 36.0 g/dL   RDW 13.7  11.5 - 15.5 %   Platelets 145 (*) 150 - 400 K/uL  URINE RAPID DRUG SCREEN (HOSP  PERFORMED)     Status: None   Collection Time    10/30/13  7:45 PM      Result Value Ref Range   Opiates NONE DETECTED  NONE DETECTED   Cocaine NONE DETECTED  NONE DETECTED   Benzodiazepines NONE DETECTED  NONE DETECTED   Amphetamines NONE DETECTED  NONE DETECTED   Tetrahydrocannabinol NONE DETECTED  NONE DETECTED   Barbiturates NONE DETECTED  NONE DETECTED   Comment:  DRUG SCREEN FOR MEDICAL PURPOSES     ONLY.  IF CONFIRMATION IS NEEDED     FOR ANY PURPOSE, NOTIFY LAB     WITHIN 5 DAYS.                LOWEST DETECTABLE LIMITS     FOR URINE DRUG SCREEN     Drug Class       Cutoff (ng/mL)     Amphetamine      1000     Barbiturate      200     Benzodiazepine   585     Tricyclics       277     Opiates          300     Cocaine          300     THC              50  BASIC METABOLIC PANEL     Status: Abnormal   Collection Time    10/31/13 11:18 AM      Result Value Ref Range   Sodium 121 (*) 137 - 147 mEq/L   Comment: CRITICAL RESULT CALLED TO, READ BACK BY AND VERIFIED WITH:     PELLITIER,E AT 1215 ON [id number] BY HOOKER,B   Potassium 3.8  3.7 - 5.3 mEq/L   Chloride 78 (*) 96 - 112 mEq/L   CO2 11 (*) 19 - 32 mEq/L   Glucose, Bld 142 (*) 70 - 99 mg/dL   BUN 19  6 - 23 mg/dL   Creatinine, Ser 2.07 (*) 0.50 - 1.35 mg/dL   Calcium 8.1 (*) 8.4 - 10.5 mg/dL   GFR calc non Af Amer 34 (*) >90 mL/min   GFR calc Af Amer 39 (*) >90 mL/min   Comment: (NOTE)     The eGFR has been calculated using the CKD EPI equation.     This calculation has not been validated in all clinical situations.     eGFR's persistently <90 mL/min signify possible Chronic Kidney     Disease.   Labs are reviewed see above values.  Consulted EDP related patient labs.  Patient will be medical admit.  Medications reviewed and no changes made.  Current Facility-Administered Medications  Medication Dose Route Frequency Provider Last Rate Last Dose  . 0.9 %  sodium chloride infusion  1,000 mL Intravenous  Continuous Hoy Morn, MD 125 mL/hr at 10/31/13 0049 1,000 mL at 10/31/13 0049  . acetaminophen (TYLENOL) tablet 650 mg  650 mg Oral Q4H PRN Hoy Morn, MD      . buPROPion [facility] SR) 12 hr tablet 100 mg  100 mg Oral Daily Hoy Morn, MD   100 mg at 10/31/13 1026  . dextrose 5 %-0.45 % sodium chloride infusion   Intravenous STAT Dorie Rank, MD      . enoxaparin (LOVENOX) injection 40 mg  40 mg Subcutaneous Q1200 Hosie Poisson, MD      . FLUoxetine (PROZAC) capsule 40 mg  40 mg Oral Daily Hoy Morn, MD   40 mg at 10/31/13 1025  . ibuprofen (ADVIL,MOTRIN) tablet 600 mg  600 mg Oral Q8H PRN Hoy Morn, MD   600 mg at 10/31/13 0044  . LORazepam (ATIVAN) tablet 0-4 mg  0-4 mg Oral 4 times per day Hoy Morn, MD   2 mg at 10/31/13 3614   Followed by  . [START ON 11/01/2013] LORazepam (ATIVAN) tablet 0-4  mg  0-4 mg Oral Q12H Hoy Morn, MD      . metoprolol succinate (TOPROL-XL) 24 hr tablet 50 mg  50 mg Oral Daily Hoy Morn, MD   50 mg at 10/30/13 1803  . ondansetron (ZOFRAN) tablet 4 mg  4 mg Oral Q8H PRN Hoy Morn, MD      . thiamine (VITAMIN B-1) tablet 100 mg  100 mg Oral Daily Hoy Morn, MD   100 mg at 10/31/13 1026   Or  . thiamine (B-1) injection 100 mg  100 mg Intravenous Daily Hoy Morn, MD      . zolpidem [facility]) tablet 5 mg  5 mg Oral QHS PRN Hoy Morn, MD   5 mg at 10/31/13 0045    Psychiatric Specialty Exam:     Blood pressure 122/66, pulse 103, temperature 98.4 F (36.9 C), temperature source Oral, resp. rate 18, height 5' 11"  (1.803 m), weight 108.9 kg (240 lb 1.3 oz), SpO2 98.00%.Body mass index is 33.5 kg/(m^2).  General Appearance: Casual and Disheveled  Eye Contact::  Good  Speech:  Clear and Coherent and Slow  Volume:  Decreased  Mood:  Depressed  Affect:  Congruent  Thought Process:  Circumstantial  Orientation:  Full (Time, Place, and Person)  Thought Content:  "Yes I've been drinking more  Suicidal Thoughts:  No   Homicidal Thoughts:  No  Memory:  Immediate;   Fair Recent;   Fair Remote;   Fair  Judgement:  Impaired  Insight:  Lacking  Psychomotor Activity:  Decreased and Tremor  Concentration:  Poor  Recall:  [place]: Fair  Akathisia:  No  Handed:  Right  AIMS (if indicated):     Assets:  Communication Skills Desire for Improvement  Sleep:      Musculoskeletal: Strength & Muscle Tone: within normal limits Gait & Station:  Patient able to move all extremities while sitting in bed; did not see patient ambulate    Patient leans: N/A  Treatment Plan Summary: Patient to be medically admitted.  Patient cleared psychiatrically at this time.  If further consult is needed once admitted hospitalist can order psychiatric consult  Earleen Newport, FNP-BC 10/31/2013 12:31 PM

## 2013-10-31 NOTE — ED Notes (Signed)
Patient sleeping, wife at bedside.  Breakfast tray brought to patient.

## 2013-10-31 NOTE — Progress Notes (Signed)
Dr Blake DivineAkula made aware of result of EKG. Will continue to monitor.

## 2013-10-31 NOTE — Progress Notes (Signed)
CRITICAL VALUE ALERT  Critical value received:  NA 121  Date of notification:  10/31/2013  Time of notification:  1215  Critical value read back:Yes.    Nurse who received alert:  Murrell ReddenErika Pelletier  MD notified (1st page):  Blake DivineAkula  Time of first page:  1215  MD notified (2nd page):  Time of second page:  Responding MD: Dr. Blake DivineAkula  Time MD responded:  [phone]

## 2013-11-01 ENCOUNTER — Encounter (HOSPITAL_COMMUNITY): Payer: Self-pay | Admitting: Pulmonary Disease

## 2013-11-01 ENCOUNTER — Inpatient Hospital Stay (HOSPITAL_COMMUNITY): Payer: Medicare Other

## 2013-11-01 DIAGNOSIS — F10931 Alcohol use, unspecified with withdrawal delirium: Secondary | ICD-10-CM

## 2013-11-01 DIAGNOSIS — R578 Other shock: Secondary | ICD-10-CM

## 2013-11-01 DIAGNOSIS — N179 Acute kidney failure, unspecified: Principal | ICD-10-CM

## 2013-11-01 DIAGNOSIS — F10231 Alcohol dependence with withdrawal delirium: Secondary | ICD-10-CM

## 2013-11-01 DIAGNOSIS — J9600 Acute respiratory failure, unspecified whether with hypoxia or hypercapnia: Secondary | ICD-10-CM

## 2013-11-01 DIAGNOSIS — F1010 Alcohol abuse, uncomplicated: Secondary | ICD-10-CM

## 2013-11-01 DIAGNOSIS — R571 Hypovolemic shock: Secondary | ICD-10-CM

## 2013-11-01 LAB — BASIC METABOLIC PANEL
BUN: 19 mg/dL (ref 6–23)
BUN: 17 mg/dL (ref 6–23)
CHLORIDE: 84 meq/L — AB (ref 96–112)
CHLORIDE: 85 meq/L — AB (ref 96–112)
CO2: 9 mEq/L — CL (ref 19–32)
CO2: 10 mEq/L — CL (ref 19–32)
Calcium: 7.9 mg/dL — ABNORMAL LOW (ref 8.4–10.5)
Calcium: 7.1 mg/dL — ABNORMAL LOW (ref 8.4–10.5)
Creatinine, Ser: 1.78 mg/dL — ABNORMAL HIGH (ref 0.50–1.35)
Creatinine, Ser: 1.38 mg/dL — ABNORMAL HIGH (ref 0.50–1.35)
GFR calc Af Amer: 64 mL/min — ABNORMAL LOW (ref >90)
GFR calc non Af Amer: 41 mL/min — ABNORMAL LOW (ref >90)
GFR calc non Af Amer: 55 mL/min — ABNORMAL LOW (ref >90)
GFR, EST AFRICAN AMERICAN: 47 mL/min — AB (ref >90)
GLUCOSE: 140 mg/dL — AB (ref 70–99)
Glucose, Bld: 71 mg/dL (ref 70–99)
POTASSIUM: 3.2 meq/L — AB (ref 3.7–5.3)
Potassium: 3.5 mEq/L — ABNORMAL LOW (ref 3.7–5.3)
Sodium: 129 mEq/L — ABNORMAL LOW (ref 137–147)
Sodium: 127 mEq/L — ABNORMAL LOW (ref 137–147)

## 2013-11-01 LAB — GLUCOSE, CAPILLARY
GLUCOSE-CAPILLARY: 148 mg/dL — AB (ref 70–99)
GLUCOSE-CAPILLARY: 174 mg/dL — AB (ref 70–99)
Glucose-Capillary: 77 mg/dL (ref 70–99)
Glucose-Capillary: 114 mg/dL — ABNORMAL HIGH (ref 70–99)
Glucose-Capillary: 139 mg/dL — ABNORMAL HIGH (ref 70–99)

## 2013-11-01 LAB — BLOOD GAS, ARTERIAL
ACID-BASE DEFICIT: 16.6 mmol/L — AB (ref 0.0–2.0)
ACID-BASE DEFICIT: 17.2 mmol/L — AB (ref 0.0–2.0)
ACID-BASE DEFICIT: 14.3 mmol/L — AB (ref 0.0–2.0)
Acid-base deficit: 13.2 mmol/L — ABNORMAL HIGH (ref 0.0–2.0)
BICARBONATE: 7.4 meq/L — AB (ref 20.0–24.0)
BICARBONATE: 12.2 meq/L — AB (ref 20.0–24.0)
Bicarbonate: 7.3 mEq/L — ABNORMAL LOW (ref 20.0–24.0)
Bicarbonate: 11.2 mEq/L — ABNORMAL LOW (ref 20.0–24.0)
Drawn by: 36496
Drawn by: 31297
Drawn by: 31297
Drawn by: 31297
FIO2: 0.36 %
FIO2: 1 %
FIO2: 0.4 %
MECHVT: 600 mL
MECHVT: 600 mL
O2 Content: 2 L/min
O2 SAT: 98.4 %
O2 Saturation: 98.7 %
O2 Saturation: 99.5 %
O2 Saturation: 99.1 %
PATIENT TEMPERATURE: 98.6
PEEP: 5 cmH2O
PEEP: 5 cmH2O
PO2 ART: 473 mmHg — AB (ref 80.0–100.0)
Patient temperature: 98.6
Patient temperature: 98.6
Patient temperature: 98.6
RATE: 20 resp/min
RATE: 25 resp/min
TCO2: 6.9 mmol/L (ref 0–100)
TCO2: 6.8 mmol/L (ref 0–100)
TCO2: 11.8 mmol/L (ref 0–100)
TCO2: 10.6 mmol/L (ref 0–100)
pCO2 arterial: 13.6 mmHg — CL (ref 35.0–45.0)
pCO2 arterial: 14.4 mmHg — CL (ref 35.0–45.0)
pCO2 arterial: 31.3 mmHg — ABNORMAL LOW (ref 35.0–45.0)
pCO2 arterial: 22.5 mmHg — ABNORMAL LOW (ref 35.0–45.0)
pH, Arterial: 7.357 (ref 7.350–7.450)
pH, Arterial: 7.327 — ABNORMAL LOW (ref 7.350–7.450)
pH, Arterial: 7.214 — ABNORMAL LOW (ref 7.350–7.450)
pH, Arterial: 7.32 — ABNORMAL LOW (ref 7.350–7.450)
pO2, Arterial: 121 mmHg — ABNORMAL HIGH (ref 80.0–100.0)
pO2, Arterial: 138 mmHg — ABNORMAL HIGH (ref 80.0–100.0)
pO2, Arterial: 172 mmHg — ABNORMAL HIGH (ref 80.0–100.0)

## 2013-11-01 LAB — CORTISOL: CORTISOL PLASMA: 23.6 ug/dL

## 2013-11-01 LAB — SODIUM, URINE, RANDOM

## 2013-11-01 LAB — CBC
HCT: 31 % — ABNORMAL LOW (ref 39.0–52.0)
HEMOGLOBIN: 10.7 g/dL — AB (ref 13.0–17.0)
MCH: 34 pg (ref 26.0–34.0)
MCHC: 34.5 g/dL (ref 30.0–36.0)
MCV: 98.4 fL (ref 78.0–100.0)
Platelets: 107 10*3/uL — ABNORMAL LOW (ref 150–400)
RBC: 3.15 MIL/uL — ABNORMAL LOW (ref 4.22–5.81)
RDW: 14.2 % (ref 11.5–15.5)
WBC: 10.4 10*3/uL (ref 4.0–10.5)

## 2013-11-01 LAB — HEPATIC FUNCTION PANEL
ALT: 215 U/L — ABNORMAL HIGH (ref 0–53)
AST: 177 U/L — AB (ref 0–37)
Albumin: 2.7 g/dL — ABNORMAL LOW (ref 3.5–5.2)
Alkaline Phosphatase: 152 U/L — ABNORMAL HIGH (ref 39–117)
Bilirubin, Direct: 3.7 mg/dL — ABNORMAL HIGH (ref 0.0–0.3)
Indirect Bilirubin: 0.8 mg/dL (ref 0.3–0.9)
Total Bilirubin: 4.5 mg/dL — ABNORMAL HIGH (ref 0.3–1.2)
Total Protein: 5.8 g/dL — ABNORMAL LOW (ref 6.0–8.3)

## 2013-11-01 LAB — SALICYLATE LEVEL

## 2013-11-01 LAB — CREATININE, URINE, RANDOM: Creatinine, Urine: 185.4 mg/dL

## 2013-11-01 LAB — OSMOLALITY, URINE: OSMOLALITY UR: 402 mosm/kg (ref 390–1090)

## 2013-11-01 LAB — URINE MICROSCOPIC-ADD ON

## 2013-11-01 LAB — HEPATITIS PANEL, ACUTE
HCV Ab: NEGATIVE
Hep A IgM: NONREACTIVE
Hep B C IgM: NONREACTIVE
Hepatitis B Surface Ag: NEGATIVE

## 2013-11-01 LAB — URINALYSIS, ROUTINE W REFLEX MICROSCOPIC
Glucose, UA: NEGATIVE mg/dL
Nitrite: NEGATIVE
PROTEIN: 30 mg/dL — AB
Specific Gravity, Urine: 1.018 (ref 1.005–1.030)
UROBILINOGEN UA: 1 mg/dL (ref 0.0–1.0)
pH: 6 (ref 5.0–8.0)

## 2013-11-01 LAB — TSH: TSH: 1.29 u[IU]/mL (ref 0.350–4.500)

## 2013-11-01 LAB — TROPONIN I: Troponin I: <0.3 ng/mL (ref <0.30)

## 2013-11-01 LAB — KETONES, QUALITATIVE

## 2013-11-01 LAB — CALCIUM, IONIZED: Calcium, Ion: 1.03 mmol/L — ABNORMAL LOW (ref 1.12–1.23)

## 2013-11-01 LAB — HEMOGLOBIN A1C
Hgb A1c MFr Bld: 5.8 % — ABNORMAL HIGH (ref <5.7)
Mean Plasma Glucose: 120 mg/dL — ABNORMAL HIGH (ref <117)

## 2013-11-01 LAB — LACTIC ACID, PLASMA
LACTIC ACID, VENOUS: 1.7 mmol/L (ref 0.5–2.2)
Lactic Acid, Venous: 2 mmol/L (ref 0.5–2.2)

## 2013-11-01 LAB — MAGNESIUM: MAGNESIUM: 1.6 mg/dL (ref 1.5–2.5)

## 2013-11-01 LAB — MRSA PCR SCREENING: MRSA by PCR: NEGATIVE

## 2013-11-01 LAB — OSMOLALITY: OSMOLALITY: 270 mosm/kg — AB (ref 275–300)

## 2013-11-01 MED ORDER — VITAL HIGH PROTEIN PO LIQD
1000.0000 mL | ORAL | Status: DC
  Administered 2013-11-01 – 2013-11-02 (×2): 1000 mL
  Filled 2013-11-01 (×2): qty 1000

## 2013-11-01 MED ORDER — LORAZEPAM 2 MG/ML IJ SOLN
1.0000 mg | Freq: Four times a day (QID) | INTRAMUSCULAR | Status: DC
  Administered 2013-11-01: 2 mg via INTRAVENOUS
  Filled 2013-11-01: qty 1

## 2013-11-01 MED ORDER — SODIUM BICARBONATE 8.4 % IV SOLN
INTRAVENOUS | Status: DC
  Administered 2013-11-01: 10:00:00 via INTRAVENOUS
  Filled 2013-11-01: qty 100

## 2013-11-01 MED ORDER — SODIUM CHLORIDE 0.9 % IV BOLUS (SEPSIS)
1000.0000 mL | Freq: Once | INTRAVENOUS | Status: AC
  Administered 2013-11-01: 1000 mL via INTRAVENOUS

## 2013-11-01 MED ORDER — SODIUM BICARBONATE 8.4 % IV SOLN
INTRAVENOUS | Status: AC
  Administered 2013-11-01: 16:00:00
  Filled 2013-11-01: qty 50

## 2013-11-01 MED ORDER — CHLORHEXIDINE GLUCONATE 0.12 % MT SOLN
15.0000 mL | Freq: Two times a day (BID) | OROMUCOSAL | Status: DC
  Administered 2013-11-01 – 2013-11-04 (×6): 15 mL via OROMUCOSAL
  Filled 2013-11-01 (×6): qty 15

## 2013-11-01 MED ORDER — SODIUM CHLORIDE 0.9 % IV SOLN
25.0000 ug/h | INTRAVENOUS | Status: DC
  Administered 2013-11-01: 25 ug/h via INTRAVENOUS
  Administered 2013-11-02: 250 ug/h via INTRAVENOUS
  Administered 2013-11-02: 175 ug/h via INTRAVENOUS
  Filled 2013-11-01 (×3): qty 50

## 2013-11-01 MED ORDER — SODIUM CHLORIDE 0.9 % IV SOLN: 1500.0000 mg | INTRAVENOUS | Status: DC

## 2013-11-01 MED ORDER — KCL IN DEXTROSE-NACL 20-5-0.9 MEQ/L-%-% IV SOLN
INTRAVENOUS | Status: DC
  Filled 2013-11-01: qty 1000

## 2013-11-01 MED ORDER — FENTANYL CITRATE 0.05 MG/ML IJ SOLN
25.0000 ug | INTRAMUSCULAR | Status: DC | PRN
  Administered 2013-11-01: 100 ug via INTRAVENOUS
  Administered 2013-11-01: 75 ug via INTRAVENOUS
  Administered 2013-11-01: 100 ug via INTRAVENOUS
  Administered 2013-11-01: 25 ug via INTRAVENOUS
  Filled 2013-11-01 (×4): qty 2

## 2013-11-01 MED ORDER — SUCCINYLCHOLINE CHLORIDE 20 MG/ML IJ SOLN
INTRAMUSCULAR | Status: AC
Start: 2013-11-01 — End: 2013-11-02
  Filled 2013-11-01: qty 1

## 2013-11-01 MED ORDER — DEXMEDETOMIDINE HCL IN NACL 200 MCG/50ML IV SOLN
0.2000 ug/kg/h | INTRAVENOUS | Status: DC
  Administered 2013-11-01: 0.6 ug/kg/h via INTRAVENOUS
  Administered 2013-11-01: 0.2 ug/kg/h via INTRAVENOUS
  Administered 2013-11-01: 0.5 ug/kg/h via INTRAVENOUS
  Administered 2013-11-01: 0.7 ug/kg/h via INTRAVENOUS
  Administered 2013-11-01: 0.5 ug/kg/h via INTRAVENOUS
  Administered 2013-11-01 – 2013-11-02 (×3): 0.7 ug/kg/h via INTRAVENOUS
  Filled 2013-11-01 (×7): qty 50

## 2013-11-01 MED ORDER — NOREPINEPHRINE BITARTRATE 1 MG/ML IV SOLN
2.0000 ug/min | INTRAVENOUS | Status: DC
  Administered 2013-11-01 (×2): 5 ug/min via INTRAVENOUS
  Filled 2013-11-01 (×2): qty 4

## 2013-11-01 MED ORDER — PANTOPRAZOLE SODIUM 40 MG PO PACK
40.0000 mg | PACK | Freq: Every day | ORAL | Status: DC
  Administered 2013-11-02 – 2013-11-04 (×3): 40 mg
  Filled 2013-11-01 (×4): qty 20

## 2013-11-01 MED ORDER — FENTANYL CITRATE 0.05 MG/ML IJ SOLN: 50.0000 ug | Freq: Once | INTRAMUSCULAR | Status: DC

## 2013-11-01 MED ORDER — METOPROLOL TARTRATE 1 MG/ML IV SOLN
5.0000 mg | Freq: Once | INTRAVENOUS | Status: AC
  Administered 2013-11-01: 5 mg via INTRAVENOUS
  Filled 2013-11-01: qty 5

## 2013-11-01 MED ORDER — LIDOCAINE HCL (CARDIAC) 20 MG/ML IV SOLN
INTRAVENOUS | Status: AC
  Filled 2013-11-01: qty 5

## 2013-11-01 MED ORDER — ETOMIDATE 2 MG/ML IV SOLN
INTRAVENOUS | Status: AC
  Administered 2013-11-01: 20 mg
  Filled 2013-11-01: qty 20

## 2013-11-01 MED ORDER — POTASSIUM CHLORIDE CRYS ER 20 MEQ PO TBCR
40.0000 meq | EXTENDED_RELEASE_TABLET | Freq: Once | ORAL | Status: AC
  Administered 2013-11-01: 40 meq via ORAL
  Filled 2013-11-01: qty 2

## 2013-11-01 MED ORDER — SODIUM BICARBONATE 8.4 % IV SOLN
INTRAVENOUS | Status: DC
  Administered 2013-11-01 – 2013-11-02 (×3): via INTRAVENOUS
  Filled 2013-11-01 (×12): qty 150

## 2013-11-01 MED ORDER — FENTANYL BOLUS VIA INFUSION: 25.0000 ug | INTRAVENOUS | Status: DC | PRN

## 2013-11-01 MED ORDER — PIPERACILLIN-TAZOBACTAM 3.375 G IVPB
3.3750 g | Freq: Three times a day (TID) | INTRAVENOUS | Status: DC
  Administered 2013-11-01 – 2013-11-08 (×21): 3.375 g via INTRAVENOUS
  Filled 2013-11-01 (×19): qty 50

## 2013-11-01 MED ORDER — PRO-STAT SUGAR FREE PO LIQD
30.0000 mL | Freq: Two times a day (BID) | ORAL | Status: DC
  Administered 2013-11-02 – 2013-11-03 (×3): 30 mL
  Filled 2013-11-01 (×6): qty 30

## 2013-11-01 MED ORDER — SODIUM CHLORIDE 0.9 % IV SOLN
2000.0000 mg | Freq: Once | INTRAVENOUS | Status: AC
  Administered 2013-11-01: 2000 mg via INTRAVENOUS
  Filled 2013-11-01: qty 2000

## 2013-11-01 MED ORDER — SODIUM CHLORIDE 0.9 % IV SOLN
INTRAVENOUS | Status: DC
  Administered 2013-11-01 – 2013-11-02 (×2): via INTRAVENOUS

## 2013-11-01 MED ORDER — MIDAZOLAM HCL 2 MG/2ML IJ SOLN
INTRAMUSCULAR | Status: AC
  Administered 2013-11-01: 4 mg
  Filled 2013-11-01: qty 4

## 2013-11-01 MED ORDER — MAGNESIUM SULFATE IN D5W 10-5 MG/ML-% IV SOLN
1.0000 g | Freq: Once | INTRAVENOUS | Status: AC
  Administered 2013-11-01: 1 g via INTRAVENOUS
  Filled 2013-11-01: qty 100

## 2013-11-01 MED ORDER — PANTOPRAZOLE SODIUM 40 MG IV SOLR: 40.0000 mg | INTRAVENOUS | Status: DC

## 2013-11-01 MED ORDER — SODIUM CHLORIDE 0.9 % IV SOLN: 0.0000 ug/h | INTRAVENOUS | Status: DC

## 2013-11-01 MED ORDER — INSULIN ASPART 100 UNIT/ML ~~LOC~~ SOLN
0.0000 [IU] | SUBCUTANEOUS | Status: DC
  Administered 2013-11-01: 1 [IU] via SUBCUTANEOUS
  Administered 2013-11-01: 2 [IU] via SUBCUTANEOUS
  Administered 2013-11-02: 3 [IU] via SUBCUTANEOUS
  Administered 2013-11-02 – 2013-11-03 (×7): 2 [IU] via SUBCUTANEOUS

## 2013-11-01 MED ORDER — MAGNESIUM SULFATE 40 MG/ML IJ SOLN
2.0000 g | Freq: Once | INTRAMUSCULAR | Status: AC
  Administered 2013-11-01: 2 g via INTRAVENOUS
  Filled 2013-11-01: qty 50

## 2013-11-01 MED ORDER — ROCURONIUM BROMIDE 50 MG/5ML IV SOLN
INTRAVENOUS | Status: AC
  Administered 2013-11-01: 50 mg
  Filled 2013-11-01: qty 2

## 2013-11-01 MED ORDER — METOPROLOL TARTRATE 1 MG/ML IV SOLN
5.0000 mg | Freq: Four times a day (QID) | INTRAVENOUS | Status: DC
  Administered 2013-11-01 (×2): 5 mg via INTRAVENOUS
  Filled 2013-11-01 (×2): qty 5

## 2013-11-01 MED ORDER — BIOTENE DRY MOUTH MT LIQD
15.0000 mL | Freq: Four times a day (QID) | OROMUCOSAL | Status: DC
  Administered 2013-11-01 – 2013-11-04 (×10): 15 mL via OROMUCOSAL

## 2013-11-01 MED ORDER — LORAZEPAM 2 MG/ML IJ SOLN
1.0000 mg | INTRAMUSCULAR | Status: DC | PRN
Start: 2013-11-01 — End: 2013-11-10
  Administered 2013-11-01 – 2013-11-02 (×2): 2 mg via INTRAVENOUS
  Filled 2013-11-01 (×3): qty 1

## 2013-11-01 MED ORDER — FENTANYL CITRATE 0.05 MG/ML IJ SOLN
INTRAMUSCULAR | Status: AC
  Administered 2013-11-01: 200 ug
  Filled 2013-11-01: qty 4

## 2013-11-01 NOTE — Progress Notes (Signed)
INITIAL NUTRITION ASSESSMENT  DOCUMENTATION CODES Per approved criteria  -Obesity Unspecified   INTERVENTION: - Start TF via OGT of Vital High Protein at 35m/hr increase by 15mevery 12 hours to goal of 6063mr with Prostat 86m56mD. Goal rate will provide 1640 calories, 156g protein, and 1204ml65me water that will meet 72% estimated calorie needs, 100% estimated protein needs - Advancing TF slowly due to pt at suspected refeeding risk r/t alcohol abuse - Will order phosphorus and magnesium level tomorrow per verbal order from MD as pt at refeeding risk  - Start adult enteral protocol - RD to continue to monitor   NUTRITION DIAGNOSIS: Inadequate oral intake related to inability to eat as evidenced by NPO.   Goal: Enteral nutrition to provide 60-70% of estimated calorie needs (22-25 kcals/kg ideal body weight) and 100% of estimated protein needs, based on ASPEN guidelines for permissive underfeeding in critically ill obese individuals   Monitor:  Weights, labs, TF tolerance, vent status   Reason for Assessment: Consult for assessment, ventilated pt   58 y.32 male  Admitting Dx: Alcohol detox  ASSESSMENT: Pt with prior h/o hypertension, dm, alcohol abuse, came in to the hospital for alcohol detox. Patient appears confused and most of the history was available from the patients wife. As per the wife, pt has a h/o alcohol abuse for more than 30 years, pt's father died 2 months ago and since then patient has been depressed and drinking more.  Experience increased tachypnea, lethargy, with elevated heart rate this afternoon with pt being as responsive as previously per RN notes. Intubated today for airway protection and maintenance. Alone in room on vent.   Patient is currently intubated on ventilator support MV: 12.3 L/min Temp (24hrs), Avg:98.2 F (36.8 C), Min:97.6 F (36.4 C), Max:99.1 F (37.3 C)  Propofol: off   Obtained verbal order from MD to start TF and monitor  refeeding labs   Getting IV magnesium and thiamine  Potassium low, oral replacement ordered prior to intubation   Alk phos, AST/ALT, and total bilirubin elevated    Height: Ht Readings from Last 1 Encounters:  10/31/13 5' 11" (1.803 m)    Weight: Wt Readings from Last 1 Encounters:  10/31/13 240 lb 1.3 oz (108.9 kg)    Ideal Body Weight: 172 lbs   % Ideal Body Weight: 139%  Wt Readings from Last 10 Encounters:  10/31/13 240 lb 1.3 oz (108.9 kg)  05/18/08 194 lb 4 oz (88.111 kg)    Usual Body Weight: Unable to assess   BMI:  Body mass index is 33.5 kg/(m^2). Class I obesity   Estimated Nutritional Needs: Kcal: 2260 Protein: 156g Fluid: per MD  Skin: Intact  Diet Order: NPO  EDUCATION NEEDS: -No education needs identified at this time   Intake/Output Summary (Last 24 hours) at 11/01/13 1616 Last data filed at 11/01/13 1000  Gross per 24 hour  Intake 2569.17 ml  Output     80 ml  Net 2489.17 ml    Last BM: 6/28   Labs:   Recent Labs Lab 10/30/13 1527 10/31/13 1118 11/01/13 0415 11/01/13 0642  NA 125* 121* 129*  --   K 4.0 3.8 3.5*  --   CL 76* 78* 84*  --   CO2 14* 11* 9*  --   BUN _0 --   CREATININE 1.88* 2.07* 1.78*  --   CALCIUM 9.5 8.1* 7.9*  --   MG  --   --   --  1.6  GLUCOSE 145* 142* 71  --     CBG (last 3)   Recent Labs  10/31/13 1735 10/31/13 2141 11/01/13 0605  GLUCAP 107* 121* 77    Scheduled Meds: . buPROPion  100 mg Oral Daily  . enoxaparin (LOVENOX) injection  40 mg Subcutaneous Q1200  . FLUoxetine  40 mg Oral Daily  . insulin aspart  0-9 Units Subcutaneous TID WC  . lidocaine (cardiac) 100 mg/5ml      . metoprolol  5 mg Intravenous 4 times per day  . piperacillin-tazobactam (ZOSYN)  IV  3.375 g Intravenous 3 times per day  . pneumococcal 23 valent vaccine  0.5 mL Intramuscular Tomorrow-1000  . succinylcholine      . thiamine  100 mg Oral Daily   Or  . thiamine  100 mg Intravenous Daily  . [START ON  11/02/2013] vancomycin  1,500 mg Intravenous Q24H  . vancomycin  2,000 mg Intravenous Once    Continuous Infusions: . sodium chloride 125 mL/hr at 11/01/13 1539  . dexmedetomidine 0.5 mcg/kg/hr (11/01/13 1500)  . norepinephrine (LEVOPHED) Adult infusion    .  sodium bicarbonate  infusion 1000 mL 125 mL/hr at 11/01/13 1202    Past Medical History  Diagnosis Date  . Hypertension   . Alcohol abuse     Drinks 1-2 large bottle wine per day  . Depression     Past Surgical History  Procedure Laterality Date  . Back surgery    . Knnes time 2    . Minor nailbed repair Right 11/03/2012    Procedure: NAIL DEBRIDEMENT AND BIOPSY RIGHT THUMB NAIL;  Surgeon: Robert V Sypher Jr., MD;  Location: [facility];  Service: Orthopedics;  Laterality: Right;    Heather Winkler MS, RD, LDN [phone] Pager [phone] Weekend/After Hours Pager  

## 2013-11-01 NOTE — Progress Notes (Signed)
[facility] Floor Coverage PM Note  Called re: critical bicarb 9 and new onset SVT 160's with subsequent increase in CIWA to 7. Reviewed labs- Stat Magnesium, Ionized Calcium, ABG. Gave 1X Metprolol IV and IV Magnesium. ABG results given over phone consistent with Mixed Primary Metabolic Acidosis, secondary respiratory and metabolic alkalosis. This would be typical for an Alcoholic ketoacidosis. Will change IV fluids to D5-he has already been given thiamine.  Anderson MaltaElizabeth Golding, DO Internal Medicine

## 2013-11-01 NOTE — Progress Notes (Signed)
PT Cancellation Note  Patient Details Name: [name] MilchDavid C Kashani MRN: [id number] DOB: 1955-07-08   Cancelled Treatment:    Reason Eval/Treat Not Completed: Medical issues which prohibited therapy--will hold PT at this time. Will likely check back another day. Thanks.    Rebeca AlertJannie Porter, MPT Pager: [phone]

## 2013-11-01 NOTE — Progress Notes (Signed)
CRITICAL VALUE ALERT  Critical value received:  CO2 14.4  Date of notification:  11/01/2013  Time of notification:  1145  Critical value read back:Yes.    Nurse who received alert:  [email or web address],rn  MD notified (1st page):  Dr.mcquaid at bedside  Time of first page:   MD notified (2nd page):  Time of second page:  Responding MD:    Time MD responded:

## 2013-11-01 NOTE — Progress Notes (Signed)
Pt heartrate is still elevated maintaining in the 150s; unable to follow complete commands; Brandi, NP notified about patients status and situation; ordered precedex to help with withdrawals and heart rate

## 2013-11-01 NOTE — Consult Note (Addendum)
PULMONARY / CRITICAL CARE MEDICINE   Name: [name] MilchDavid C [name] MRN: [id number] DOB: Dec 22, 1955    ADMISSION DATE:  10/30/2013 CONSULTATION DATE:  11/01/13  REFERRING MD :  Dr. Blake DivineAkula  PRIMARY SERVICE: TRH  CHIEF COMPLAINT:  ETOH Withdrawal  BRIEF PATIENT DESCRIPTION: 58 y.o. male with prior h/o hypertension, dm, alcohol abuse, came in to the hospital for alcohol detox. Patient appears confused and most of the history was available from the patients wife. As per the wife, pt has a h/o alcohol abuse for more than 30 years,. patien'ts father died 2 months ago and since then patient has been depressed and drinking more. No suicidal ideations as per the wife . He was evaluated yesterday by EDP and [facility] when he was found to have hyponatremia and metabolic acidosis. Rapid response called 6/29 for tachycardia (160 bpm) and irregular respiratory pattern and increased work of breathing, PCCM to see.    SIGNIFICANT EVENTS / STUDIES:  6/27: admitted 6/29: tachycardia (160 bpm) and irregular respiratory pattern and increased work of breathing, PCCM to see.  LINES / TUBES:   CULTURES: Staph aureus PCR >> positive (neg MRSA)  ANTIBIOTICS: Vancomycin 6/29 >>> Zosyn 6/29 >>>  HISTORY OF PRESENT ILLNESS:  58 y.o. male with prior h/o hypertension, dm, alcohol abuse, came in to the hospital for alcohol detox. Patient appears confused and most of the history was available from the patients wife. As per the wife, pt has a h/o alcohol abuse for more than 30 years,. patien'ts father died 2 months ago and since then patient has been depressed and drinking more. No suicidal ideations as per the wife . He was evaluated yesterday by EDP and [facility] when he was found to have hyponatremia and metabolic acidosis. Rapid response called 6/29 for tachycardia (160 bpm) and irregular respiratory pattern and increased work of breathing.  PAST MEDICAL HISTORY :   Past Medical History  Diagnosis Date  . Hypertension   . Alcohol abuse   . Diabetes mellitus without complication    Past Surgical History  Procedure Laterality Date  . Back surgery    . Knnes time 2    . Minor nailbed repair Right 11/03/2012    Procedure: NAIL DEBRIDEMENT AND BIOPSY RIGHT THUMB NAIL;  Surgeon: Wyn Forsterobert V Sypher Jr., MD;  Location: [facility];  Service: Orthopedics;  Laterality: Right;   Prior to Admission medications   Medication Sig Start Date End Date Taking? Authorizing Provider  buPROPion (WELLBUTRIN SR) 100 MG 12 hr tablet Take 100 mg by mouth daily.    Yes Historical Provider, MD  FLUoxetine (PROZAC) 40 MG capsule Take 40 mg by mouth daily.   Yes Historical Provider, MD  metoprolol succinate (TOPROL-XL) 50 MG 24 hr tablet Take 50 mg by mouth daily. Take with or immediately following a meal.   Yes Historical Provider, MD  OVER THE COUNTER MEDICATION Take 2 tablets by mouth daily. Anti liver pills   Yes Historical Provider, MD  zolpidem (AMBIEN CR) 12.5 MG CR tablet Take 12.5 mg by mouth at bedtime as needed for sleep.   Yes Historical Provider, MD   No Known Allergies  FAMILY HISTORY:  History reviewed. No pertinent family history. SOCIAL HISTORY:  reports that he has never smoked. He has never used smokeless tobacco. He reports that he drinks about 2.4 ounces of alcohol per week. He reports that he does not use illicit drugs.  REVIEW OF SYSTEMS:   Patient intermittently confused  SUBJECTIVE: Patient with persistent tachycardia, hypotension and hyperventilation. Will require central line and caridzem gtt.   VITAL SIGNS: Temp:  [97.6 F (36.4 C)-98.4 F (36.9 C)] 97.8 F (36.6 C) (06/29 0531) Pulse Rate:  [103-164] 146 (06/29 1000) Resp:  [18-28] 26 (06/29 1000) BP: (94-130)/(59-80) 106/70 mmHg (06/29 1000) SpO2:  [97 %-100 %] 100 % (06/29 1000) Weight:  [240 lb 1.3 oz (108.9 kg)] 240 lb 1.3 oz (108.9 kg) (06/28 1213) HEMODYNAMICS:      INTAKE / OUTPUT: Intake/Output     06/28 0701 - 06/29 0700 06/29 0701 - 06/30 0700   P.O. 360    I.V. (mL/kg) 2204.2 (20.2) 80 (0.7)   IV Piggyback 50 100   Total Intake(mL/kg) 2614.2 (24) 180 (1.7)   Urine (mL/kg/hr) 230 (0.1)    Total Output 230     Net +2384.2 +180        Urine Occurrence 5 x    Stool Occurrence 5 x      PHYSICAL EXAMINATION: General: Acutely ill appearing male  Neuro: Alert, occasionally oriented, calm affect HEENT: PERRL Cardiovascular: Tachycardia - regular, no murmurs Lungs: CTA Abdomen: soft, non-distended, non-tender Musculoskeletal: MAEs Skin: intact  LABS:  CBC  Recent Labs Lab 10/30/13 1527 11/01/13 0415  WBC 12.8* 10.4  HGB 13.5 10.7*  HCT 37.8* 31.0*  PLT 145* 107*   Coag's No results found for this basename: APTT, INR,  in the last 168 hours BMET  Recent Labs Lab 10/30/13 1527 10/31/13 1118 11/01/13 0415  NA 125* 121* 129*  K 4.0 3.8 3.5*  CL 76* 78* 84*  CO2 14* 11* 9*  BUN 17 19 19   CREATININE 1.88* 2.07* 1.78*  GLUCOSE 145* 142* 71   Electrolytes  Recent Labs Lab 10/30/13 1527 10/31/13 1118 11/01/13 0415 11/01/13 0642  CALCIUM 9.5 8.1* 7.9*  --   MG  --   --   --  1.6   Sepsis Markers  Recent Labs Lab 11/01/13 0642  LATICACIDVEN 2.0   ABG  Recent Labs Lab 11/01/13 0606  PHART 7.357  PCO2ART 13.6*  PO2ART 121.0*   Liver Enzymes  Recent Labs Lab 10/30/13 1527  AST 450*  ALT 351*  ALKPHOS 176*  BILITOT 4.4*  ALBUMIN 3.7   Cardiac Enzymes No results found for this basename: TROPONINI, PROBNP,  in the last 168 hours Glucose  Recent Labs Lab 10/31/13 1735 10/31/13 2141 11/01/13 0605  GLUCAP 107* 121* 77    Imaging Dg Chest 2 View  10/31/2013   CLINICAL DATA:  58 year old male with shortness of breath. Hypertension. Cough. Query aspiration. Initial encounter.  EXAM: CHEST  2 VIEW  COMPARISON:  10/30/2009.  FINDINGS: Semi upright AP and lateral views of the chest. Lower lung volumes  with more elevation of the right hemidiaphragm. Normal cardiac size and mediastinal contours. Visualized tracheal air column is within normal limits. No confluent pulmonary opacity or consolidation. No pneumothorax, edema, or pleural effusion. No acute osseous abnormality identified.  IMPRESSION: Lower lung volumes, otherwise no acute cardiopulmonary abnormality.   Electronically Signed   By: Augusto Gamble M.D.   On: 10/31/2013 16:24    ASSESSMENT / PLAN:  NEUROLOGIC A:  Acute encephalopathy related to ETOH Withdrawal  P:   RASS goal: n/a Decreased precedex gtt d/t hypotension Ct wellbutrin, prozac, and ambien Ct Ativan PRN   GASTROINTESTINAL A:   Elevated Liver enzymes - h/o etoh abuse Ammonia 6/28: 28 Elevated total bilirubin (4.5)  Diarrhea P:   Ct Bicarb gtt with 5% dextrose Start NPO  F/u abd U/S F/u ammonia  HEMATOLOGIC A:   Anemia - related to GI losses Thrombocytopenia  P:  Transfuse <7 Ct Lovenox VTE prophylaxis   PULMONARY A: At Risk Acute Respiratory Failure - secondary to ETOH withdrawal  Metabolic Acidosis with co-comitant Respiratory Alkalosis - elevated anion gap (39) secondary to excessive GI losses and starvation Repeat lactic acid is low, which is reassuring  Hyperventilation  P:   Intubate  F/u ABG this afternoon and in the AM F/u CXR  CARDIOVASCULAR A:  Tachycardia  Hypotension H/o RBBB? P: F/u EKG Metoprolol 5mg  IV q6h QID for tachycardia Central line insertion Start Levophed for hypotension Consider diltiazem gtt for A-fib with RVR? Trend CEs F/u Echo  RENAL A:   Metabolic Acidosis - unclear etiology, decreased PO intake - likely alcoholic ketoacidosis    P:   Adjust bicarb gtt 150mEq @ 17025ml/hr F/u serum ketones  INFECTIOUS A:   Diarrhea - ongoing for past 6 months.  Increased in last 2 wks pta.   Afebrile, mild leukocytosis P:   Started Vanc/Zosyn 6/29 F/u Blood cultures Assess stool culture and c-diff Consider Flagyl/Cipro  for GI source of infection  ENDOCRINE A:   Stable TSH 6/29: 1.290 Cortisol 6/29: 23.6 Hgb A1C 6/28: 5.8 P:   CBG monitoring   Summary: Patient with increased work of breathing, tachycardia and hypotension. Will need intubation, central line and possible pressors and Cardizem gtt. Unsure of the etiology of the metabolic acidosis. Wife at bedside and updated about plan of care.   Elsie AmisJessica Koch ACNP student  Canary BrimBrandi Ollis, NP-C [facility] Pgr: [phone] or [phone]  Attending:  I have seen and examined the patient with nurse practitioner/resident and agree with the note above.   Not clear to me that this is only alcohol withdrawal, I fear we are dealing with sepsis as well.  Will add antibiotics, look for other sources of metabolic acidosis and perform a RUQ ultrasound.  Heber CarolinaBrent McQuaid, MD [facility] Pager: [phone] Cell: [phone] If no response, call [phone]  I have personally obtained a history, examined the patient, evaluated laboratory and imaging results, formulated the assessment and plan and placed orders.  CRITICAL CARE: The patient is critically ill with multiple organ systems failure and requires high complexity decision making for assessment and support, frequent evaluation and titration of therapies, application of advanced monitoring technologies and extensive interpretation of multiple databases. Critical Care Time devoted to patient care services described in this note is 45 minutes.    11/01/2013, 10:22 AM

## 2013-11-01 NOTE — Progress Notes (Signed)
CSW received referral for Current Substance Abuse and pt requesting detox and rehab.  CSW reviewed chart and noted that pt intubated and on vent. Assessment for alcohol rehab not appropriate at this time.  CSW to continue to follow and assist with resources for alcohol rehab when pt stable to participate in assessment.  Loletta SpecterSuzanna Kidd, MSW, LCSW Clinical Social Work [phone]

## 2013-11-01 NOTE — Progress Notes (Signed)
Pt's HR sustaining in 160's on telemetry monitor. VS 130/71, 97.8, 120, 28.  EKG done and showed Supraventricular Tachycardia 164. Pt is lethargic, but arousable MD notified - Dr. Phillips OdorGolding. New order ABG, stat labs, Magnesium Sulfate IV, Metoprolol 5mg  IV  given as ordered. VS rechecked 111/71, 151, 24, 97% 2L. 0650 Pt's HR remains sustaining in 150's despite after meds given. Dr. Blake DivineAkula updated of this.

## 2013-11-01 NOTE — Progress Notes (Signed)
Pt having increased tachypnea, lethargy, with elevated heart rate; pt is not as responsive as previously; precedex decreased; pt is hypotensive; wife at bedside; critical care in room, awaiting to intubate at this time.

## 2013-11-01 NOTE — Procedures (Addendum)
Intubation Procedure Note [name] MilchDavid C [name] [id number] 1955-06-01  Procedure: Intubation Indications: Airway protection and maintenance  Procedure Details Consent: Risks of procedure as well as the alternatives and risks of each were explained to the (patient/caregiver).  Consent for procedure obtained. Time Out: Verified patient identification, verified procedure, site/side was marked, verified correct patient position, special equipment/implants available, medications/allergies/relevent history reviewed, required imaging and test results available.  Performed  Maximum sterile technique was used including antiseptics, gloves, hand hygiene and mask.  MAC    Evaluation Hemodynamic Status: BP stable throughout; O2 sats: stable throughout Patient's Current Condition: stable Complications: No apparent complications Patient did tolerate procedure well. Chest X-ray ordered to verify placement.  CXR: pending.  [name],[name] 11/01/2013

## 2013-11-01 NOTE — Progress Notes (Signed)
CRITICAL VALUE ALERT  Critical value received:  CO2 9.0  Date of notification:  10/31/13  Time of notification:  0530  Critical value read back:Yes.    Nurse who received alert:  Newman NipMonica Clayton  MD notified (1st page):  Dr. Phillips OdorGolding  Time of first page:  0535  MD notified (2nd page):  Time of second page:  Responding MD:  Dr. Phillips OdorGolding   Time MD responded:  [phone]

## 2013-11-01 NOTE — Progress Notes (Signed)
ANTIBIOTIC CONSULT NOTE - INITIAL  Pharmacy Consult for vancomycin / Zosyn Indication: sepsis  No Known Allergies  Patient Measurements: Height: 5\' 11"  (180.3 cm) Weight: 240 lb 1.3 oz (108.9 kg) IBW/kg (Calculated) : 75.3   Vital Signs: Temp: 97.8 F (36.6 C) (06/29 0531) Temp src: Oral (06/29 0531) BP: 83/59 mmHg (06/29 1305) Pulse Rate: 144 (06/29 1305) Intake/Output from previous day: 06/28 0701 - 06/29 0700 In: 2614.2 [P.O.:360; I.V.:2204.2; IV Piggyback:50] Out: 230 [Urine:230] Intake/Output from this shift: Total I/O In: 180 [I.V.:80; IV Piggyback:100] Out: -   Labs:  Recent Labs  10/30/13 1527 10/31/13 1118 11/01/13 0415  WBC 12.8*  --  10.4  HGB 13.5  --  10.7*  PLT 145*  --  107*  CREATININE 1.88* 2.07* 1.78*   Estimated Creatinine Clearance: 57.4 ml/min (by C-G formula based on Cr of 1.78). No results found for this basename: VANCOTROUGH, Leodis BinetVANCOPEAK, VANCORANDOM, GENTTROUGH, GENTPEAK, GENTRANDOM, TOBRATROUGH, TOBRAPEAK, TOBRARND, AMIKACINPEAK, AMIKACINTROU, AMIKACIN,  in the last 72 hours   Microbiology: Recent Results (from the past 720 hour(s))  MRSA PCR SCREENING     Status: None   Collection Time    11/01/13  8:31 AM      Result Value Ref Range Status   MRSA by PCR NEGATIVE  NEGATIVE Final   Comment:            The GeneXpert MRSA Assay (FDA     approved for NASAL specimens     only), is one component of a     comprehensive MRSA colonization     surveillance program. It is not     intended to diagnose MRSA     infection nor to guide or     monitor treatment for     MRSA infections.    Medical History: Past Medical History  Diagnosis Date  . Hypertension   . Alcohol abuse     Drinks 1-2 large bottle wine per day  . Depression     Medications:  Scheduled:  . buPROPion  100 mg Oral Daily  . enoxaparin (LOVENOX) injection  40 mg Subcutaneous Q1200  . etomidate      . fentaNYL      . fentaNYL  50 mcg Intravenous Once  . FLUoxetine   40 mg Oral Daily  . insulin aspart  0-9 Units Subcutaneous TID WC  . lidocaine (cardiac) 100 mg/115ml      . metoprolol  5 mg Intravenous 4 times per day  . midazolam      . piperacillin-tazobactam (ZOSYN)  IV  3.375 g Intravenous 3 times per day  . pneumococcal 23 valent vaccine  0.5 mL Intramuscular Tomorrow-1000  . rocuronium      . sodium chloride  1,000 mL Intravenous Once  . succinylcholine      . thiamine  100 mg Oral Daily   Or  . thiamine  100 mg Intravenous Daily  . [START ON 11/02/2013] vancomycin  1,500 mg Intravenous Q24H  . vancomycin  2,000 mg Intravenous Once   Infusions:  . sodium chloride Stopped (11/01/13 1030)  . dexmedetomidine 0.3 mcg/kg/hr (11/01/13 1330)  . fentaNYL infusion INTRAVENOUS    . norepinephrine (LEVOPHED) Adult infusion    .  sodium bicarbonate  infusion 1000 mL 125 mL/hr at 11/01/13 1202   PRN: acetaminophen, fentaNYL, ibuprofen, LORazepam, ondansetron  Assessment: 58 y/o M brought to hospital for alcohol detoxification, was found to have electrolyte abnormalities and admitted to ICU/SD.  Now with findings suggestive of sepsis -  to begin empiric Zosyn and vancomycin with pharmacy dosing assistance requested.  Goal of Therapy:  Appropriate antibiotic dosing for renal function; eradication of infection. Vancomycin trough 15-20   Plan:  1. Zosyn 3.375 grams IV q8h, each dose infused over 4 hours. 2. Vancomycin 2000 mg IV x 1, then 1500 mg IV q24h. 3. Follow serum creatinine, culture results, clinical course. 4. Check vancomycin trough at steady-state unless de-escalation occurs in the interim.  Elie Goodyandy Absher, PharmD, BCPS Pager: [phone] 11/01/2013  1:56 PM

## 2013-11-01 NOTE — Procedures (Signed)
Arterial Catheter Insertion Procedure Note [name] MilchDavid C [name] [id number] 03-Apr-1956  Procedure: Insertion of Arterial Catheter  Indications: Blood pressure monitoring  Procedure Details Consent: Unable to obtain consent because of emergent medical necessity. Time Out: Verified patient identification, verified procedure, site/side was marked, verified correct patient position, special equipment/implants available, medications/allergies/relevent history reviewed, required imaging and test results available.  Performed  Maximum sterile technique was used including antiseptics, cap, gloves, gown, hand hygiene, mask and sheet. Skin prep: Chlorhexidine; local anesthetic administered 20 gauge catheter was inserted into right radial artery using the Seldinger technique.  Evaluation Blood flow good; BP tracing good. Complications: No apparent complications.   [name] BoundYACOUB,[name]

## 2013-11-01 NOTE — Progress Notes (Signed)
eLink Physician-Brief Progress Note Patient Name: [name] MilchDavid C [name] DOB: December 23, 1955 MRN: [id number]  Date of Service  11/01/2013   HPI/Events of Note   Pt with ? etoh w/d on max precedex requiring frequent fentanyl boluses for agitation on vent  eICU Interventions  Changed fentanyl to continuous infusion, monitor bp and pulse   Intervention Category Major Interventions: Acid-Base disturbance - evaluation and management;Change in mental status - evaluation and management  Sandrea HughsMichael Jarae Nemmers 11/01/2013, 10:45 PM

## 2013-11-01 NOTE — Progress Notes (Signed)
OT Cancellation Note  Patient Details Name: [name] MRN: [id number] DOB: 1955-10-24   Cancelled Treatment:    Reason Eval/Treat Not Completed: Medical issues which prohibited therapy Medical issues which prohibited therapy--will hold OT at this time. Will likely check back another day. Thanks   Einar CrowEDDING, Lorraine D 11/01/2013, 12:09 PM

## 2013-11-01 NOTE — Progress Notes (Signed)
[facility] PROGRESS NOTE  [name] MilchDavid C [name] WUJ:[id number] DOB: [birth year] DOA: 10/30/2013 PCP: [name] Interim summary: [name] MilchDavid C [name] is a 58 y.o. male with prior h/o hypertension, dm, alcohol abuse, came in to the hospital for alcohol detox. Patient appears confused and most of the history was available from the patients wife. As per the wife, pt has a h/o alcohol abuse for more than 30 years,. patien'ts father died 2 months ago and since then patient has been depressed and drinking more. No suicidal ideations as per the wife . He was evaluated yesterday by EDP and [facility] when he was found to have hyponatremia and metabolic acidosis . Hence he got referred to medical service for evaluation and management of the above. Notified by RN earlier at 7 30 am that pt is tachycardic with rate in 150's.  He was given fluid bolus of 1 liter and  was transferred to step down.   Assessment/Plan: Metabolic acidosis with acute renal failure: Possibly from alcohol ketoacidosis and dehydration, started the patient on IV fluids, IV sodium bicarbonate.  His ABG revealed low pco2, and ph of 7.3. He is tachypnic and hyperventilating.   Elevated liver function tests: possibly from alcoholic hepatitis.  Improving.    Lethargic, acute encephalopathy: possibly metabolic.    Anemia: normocytic. Get anemia panel.    Hyponatremia: improving , possibly from dehydration. Improving with fluids.   Alcohol abuse: CIWA On board.   Tachycardia: possibly from the withdrawal symptoms. PCCM consulted to see if he needs to be on precedex drip.   DVT prophylaxis        Code Status: full code. Confirmed tonight.  Family Communication: wife at bedside Disposition Plan: remain in stepdown.    Consultants:  PCCM.   Procedures:  none  Antibiotics:  none  HPI/Subjective: Slow speech, points to epigastric area, and reports pain .   Objective: Filed  Vitals:   11/01/13 1551  BP:   Pulse: 138  Temp:   Resp: 29    Intake/Output Summary (Last 24 hours) at 11/01/13 1620 Last data filed at 11/01/13 1000  Gross per 24 hour  Intake 2569.17 ml  Output     80 ml  Net 2489.17 ml   Filed Weights   10/31/13 1213  Weight: 108.9 kg (240 lb 1.3 oz)    Exam:   General:  Lethargic,   Cardiovascular: tachycardic,   Respiratory: tachypnic, diminished air entry at bases.   Abdomen: soft NT bs+   Data Reviewed: Basic Metabolic Panel:  Recent Labs Lab 10/30/13 1527 10/31/13 1118 11/01/13 0415 11/01/13 0642  NA 125* 121* 129*  --   K 4.0 3.8 3.5*  --   CL 76* 78* 84*  --   CO2 14* 11* 9*  --   GLUCOSE 145* 142* 71  --   BUN 17 19 19   --   CREATININE 1.88* 2.07* 1.78*  --   CALCIUM 9.5 8.1* 7.9*  --   MG  --   --   --  1.6   Liver Function Tests:  Recent Labs Lab 10/30/13 1527 11/01/13 1115  AST 450* 177*  ALT 351* 215*  ALKPHOS 176* 152*  BILITOT 4.4* 4.5*  PROT 7.3 5.8*  ALBUMIN 3.7 2.7*   No results found for this basename: LIPASE, AMYLASE,  in the last 168 hours  Recent Labs Lab 10/31/13 1655  AMMONIA 28   CBC:  Recent Labs Lab 10/30/13 1527 11/01/13 0415  WBC 12.8* 10.4  HGB 13.5 10.7*  HCT 37.8* 31.0*  MCV 95.9 98.4  PLT 145* 107*   Cardiac Enzymes:  Recent Labs Lab 11/01/13 1321  TROPONINI <0.30   BNP (last 3 results) No results found for this basename: PROBNP,  in the last 8760 hours CBG:  Recent Labs Lab 10/31/13 1735 10/31/13 2141 11/01/13 0605  GLUCAP 107* 121* 77    Recent Results (from the past 240 hour(s))  MRSA PCR SCREENING     Status: None   Collection Time    11/01/13  8:31 AM      Result Value Ref Range Status   MRSA by PCR NEGATIVE  NEGATIVE Final   Comment:            The GeneXpert MRSA Assay (FDA     approved for NASAL specimens     only), is one component of a     comprehensive MRSA colonization     surveillance program. It is not     intended to  diagnose MRSA     infection nor to guide or     monitor treatment for     MRSA infections.     Studies: Dg Chest 2 View  10/31/2013   CLINICAL DATA:  58 year old male with shortness of breath. Hypertension. Cough. Query aspiration. Initial encounter.  EXAM: CHEST  2 VIEW  COMPARISON:  10/30/2009.  FINDINGS: Semi upright AP and lateral views of the chest. Lower lung volumes with more elevation of the right hemidiaphragm. Normal cardiac size and mediastinal contours. Visualized tracheal air column is within normal limits. No confluent pulmonary opacity or consolidation. No pneumothorax, edema, or pleural effusion. No acute osseous abnormality identified.  IMPRESSION: Lower lung volumes, otherwise no acute cardiopulmonary abnormality.   Electronically Signed   By: Augusto Gamble M.D.   On: 10/31/2013 16:24   Dg Chest Port 1 View  11/01/2013   CLINICAL DATA:  Line placement and endotracheal tube placement  EXAM: PORTABLE CHEST - 1 VIEW  COMPARISON:  October 31, 2013  FINDINGS: The heart size and mediastinal contours are stable. Endotracheal tube is identified with distal tip 2.1 cm from carina. Retraction by 2 cm is recommended. A right jugular central venous line is identified with distal tip in the superior vena cava. There is mild central pulmonary vascular congestion. There is no pneumothorax. There is no focal pneumonia. The visualized skeletal structures are stable.  IMPRESSION: Endotracheal tube identified with distal tip 2.1 cm from carina. Retraction by 2 cm is recommended. There is no pneumothorax. Right jugular central venous line in good position.   Electronically Signed   By: Sherian Rein M.D.   On: 11/01/2013 15:24    Scheduled Meds: . buPROPion  100 mg Oral Daily  . enoxaparin (LOVENOX) injection  40 mg Subcutaneous Q1200  . FLUoxetine  40 mg Oral Daily  . insulin aspart  0-9 Units Subcutaneous TID WC  . lidocaine (cardiac) 100 mg/79ml      . metoprolol  5 mg Intravenous 4 times per day  .  piperacillin-tazobactam (ZOSYN)  IV  3.375 g Intravenous 3 times per day  . pneumococcal 23 valent vaccine  0.5 mL Intramuscular Tomorrow-1000  . succinylcholine      . thiamine  100 mg Oral Daily   Or  . thiamine  100 mg Intravenous Daily  . [START ON 11/02/2013] vancomycin  1,500 mg Intravenous Q24H  . vancomycin  2,000 mg Intravenous Once   Continuous Infusions: . sodium chloride  125 mL/hr at 11/01/13 1539  . dexmedetomidine 0.5 mcg/kg/hr (11/01/13 1500)  . norepinephrine (LEVOPHED) Adult infusion    .  sodium bicarbonate  infusion 1000 mL 125 mL/hr at 11/01/13 1202    Active Problems:   DIABETES MELLITUS   Morbid obesity   DEPRESSIVE DISORDER NOT ELSEWHERE CLASSIFIED   HYPERTENSION   Acute alcoholic hepatitis   ALCOHOL ABUSE, HX OF   Alcohol withdrawal   Hyponatremia   Elevated liver function tests   Metabolic acidosis   Acute renal failure   Acute respiratory failure   Hypovolemic shock    Time spent: 30 minutes.     [facility] LLCKULA,VIJAYA  [facility] Pager [phone] If 7PM-7AM, please contact night-coverage at [email or web address], password [email or web address] 11/01/2013, 4:20 PM  LOS: 2 days

## 2013-11-01 NOTE — Progress Notes (Signed)
eLink Physician-Brief Progress Note Patient Name: [name] MilchDavid C [name] DOB: [birth year] MRN: [id number]  Date of Service  11/01/2013   HPI/Events of Note   Pt with dt's now intubated and in need of gi prophylaxis  eICU Interventions  Added PPI per tube   Intervention Category Minor Interventions: Routine modifications to care plan (e.g. PRN medications for pain, fever)  [name] HughsMichael [name] 11/01/2013, 8:54 PM

## 2013-11-01 NOTE — Significant Event (Signed)
Rapid Response Event Note  Overview: Time Called: 0600 Arrival Time: 0620 Event Type: Cardiac  Initial Focused Assessment: Pt has become more tachycardic with SVT 160's. Pt very slow to respond to questions and slow to follow commands   Interventions:Labs drawn, EKG, Lopressor and Magnesium given per MD    Event Summary: [facility]  Name of Physician Notified: Dr. Phillips OdorGolding at [phone]    at    Outcome: Other (Comment)  Event End Time: 0715  Bayard HuggerDenny, Cheryl B

## 2013-11-01 NOTE — Procedures (Deleted)
Central Venous Catheter Insertion Procedure Note [name] MilchDavid C [name] [id number] 08-27-1955  Procedure: Insertion of Central Venous Catheter Indications: Assessment of intravascular volume, Drug and/or fluid administration and Frequent blood sampling  Procedure Details Consent: Risks of procedure as well as the alternatives and risks of each were explained to the (patient/caregiver).  Consent for procedure obtained. Time Out: Verified patient identification, verified procedure, site/side was marked, verified correct patient position, special equipment/implants available, medications/allergies/relevent history reviewed, required imaging and test results available.  Performed  Maximum sterile technique was used including antiseptics, cap, gloves, gown, hand hygiene, mask and sheet. Skin prep: Chlorhexidine; local anesthetic administered A antimicrobial bonded/coated triple lumen catheter was placed in the right internal jugular vein using the Seldinger technique.  Evaluation Blood flow good Complications: No apparent complications Patient did tolerate procedure well. Chest X-ray ordered to verify placement.  CXR: pending.  [name],[name] ACNP Student  11/01/2013, 3:09 PM

## 2013-11-01 NOTE — Procedures (Signed)
Central Venous Catheter Insertion Procedure Note Daralene MilchDavid C [place] [id number] 07-24-1955  Procedure: Insertion of Central Venous Catheter Indications: Assessment of intravascular volume, Drug and/or fluid administration and Frequent blood sampling  Procedure Details Consent: Risks of procedure as well as the alternatives and risks of each were explained to the (patient/caregiver).  Consent for procedure obtained. Time Out: Verified patient identification, verified procedure, site/side was marked, verified correct patient position, special equipment/implants available, medications/allergies/relevent history reviewed, required imaging and test results available.  Performed  Maximum sterile technique was used including antiseptics, cap, gloves, gown, hand hygiene, mask and sheet. Skin prep: Chlorhexidine; local anesthetic administered A antimicrobial bonded/coated triple lumen catheter was placed in the right internal jugular vein using the Seldinger technique.  Evaluation Blood flow good Complications: No apparent complications Patient did tolerate procedure well. Chest X-ray ordered to verify placement.  CXR: pending.  U/S used in placement.  [name],WESAM 11/01/2013, 3:00 PM

## 2013-11-02 ENCOUNTER — Inpatient Hospital Stay (HOSPITAL_COMMUNITY): Payer: Medicare Other

## 2013-11-02 DIAGNOSIS — E871 Hypo-osmolality and hyponatremia: Secondary | ICD-10-CM

## 2013-11-02 DIAGNOSIS — E872 Acidosis, unspecified: Secondary | ICD-10-CM

## 2013-11-02 DIAGNOSIS — I517 Cardiomegaly: Secondary | ICD-10-CM

## 2013-11-02 LAB — COMPREHENSIVE METABOLIC PANEL
ALT: 157 U/L — AB (ref 0–53)
AST: 111 U/L — ABNORMAL HIGH (ref 0–37)
Albumin: 2.3 g/dL — ABNORMAL LOW (ref 3.5–5.2)
Alkaline Phosphatase: 144 U/L — ABNORMAL HIGH (ref 39–117)
BUN: 16 mg/dL (ref 6–23)
CO2: 13 mEq/L — ABNORMAL LOW (ref 19–32)
CREATININE: 1.31 mg/dL (ref 0.50–1.35)
Calcium: 6.9 mg/dL — ABNORMAL LOW (ref 8.4–10.5)
Chloride: 84 mEq/L — ABNORMAL LOW (ref 96–112)
GFR calc non Af Amer: 59 mL/min — ABNORMAL LOW (ref >90)
GFR, EST AFRICAN AMERICAN: 68 mL/min — AB (ref >90)
Glucose, Bld: 170 mg/dL — ABNORMAL HIGH (ref 70–99)
Potassium: 2.8 mEq/L — CL (ref 3.7–5.3)
SODIUM: 126 meq/L — AB (ref 137–147)
TOTAL PROTEIN: 5 g/dL — AB (ref 6.0–8.3)
Total Bilirubin: 4.1 mg/dL — ABNORMAL HIGH (ref 0.3–1.2)

## 2013-11-02 LAB — BASIC METABOLIC PANEL
BUN: 16 mg/dL (ref 6–23)
CO2: 22 mEq/L (ref 19–32)
Calcium: 6.4 mg/dL — CL (ref 8.4–10.5)
Chloride: 94 mEq/L — ABNORMAL LOW (ref 96–112)
Creatinine, Ser: 1.21 mg/dL (ref 0.50–1.35)
GFR, EST AFRICAN AMERICAN: 75 mL/min — AB (ref >90)
GFR, EST NON AFRICAN AMERICAN: 65 mL/min — AB (ref >90)
GLUCOSE: 183 mg/dL — AB (ref 70–99)
POTASSIUM: 2.9 meq/L — AB (ref 3.7–5.3)
SODIUM: 136 meq/L — AB (ref 137–147)

## 2013-11-02 LAB — BLOOD GAS, ARTERIAL
Acid-base deficit: 10.5 mmol/L — ABNORMAL HIGH (ref 0.0–2.0)
Acid-base deficit: 6.3 mmol/L — ABNORMAL HIGH (ref 0.0–2.0)
BICARBONATE: 12.9 meq/L — AB (ref 20.0–24.0)
Bicarbonate: 17.4 mEq/L — ABNORMAL LOW (ref 20.0–24.0)
DRAWN BY: 276051
Drawn by: 232811
FIO2: 0.3 %
FIO2: 0.3 %
MECHVT: 600 mL
O2 SAT: 98.9 %
O2 Saturation: 98.5 %
PCO2 ART: 30.2 mmHg — AB (ref 35.0–45.0)
PEEP/CPAP: 5 cmH2O
PEEP/CPAP: 5 cmH2O
PH ART: 7.379 (ref 7.350–7.450)
PO2 ART: 129 mmHg — AB (ref 80.0–100.0)
PRESSURE SUPPORT: 5 cmH2O
Patient temperature: 98.9
Patient temperature: 98.6
RATE: 25 resp/min
TCO2: 12 mmol/L (ref 0–100)
TCO2: 16.2 mmol/L (ref 0–100)
pCO2 arterial: 22.2 mmHg — ABNORMAL LOW (ref 35.0–45.0)
pH, Arterial: 7.382 (ref 7.350–7.450)
pO2, Arterial: 111 mmHg — ABNORMAL HIGH (ref 80.0–100.0)

## 2013-11-02 LAB — MAGNESIUM: Magnesium: 2 mg/dL (ref 1.5–2.5)

## 2013-11-02 LAB — CBC
HEMATOCRIT: 28.9 % — AB (ref 39.0–52.0)
HEMOGLOBIN: 9.9 g/dL — AB (ref 13.0–17.0)
MCH: 34 pg (ref 26.0–34.0)
MCHC: 34.3 g/dL (ref 30.0–36.0)
MCV: 99.3 fL (ref 78.0–100.0)
Platelets: 107 10*3/uL — ABNORMAL LOW (ref 150–400)
RBC: 2.91 MIL/uL — ABNORMAL LOW (ref 4.22–5.81)
RDW: 14.6 % (ref 11.5–15.5)
WBC: 8.1 10*3/uL (ref 4.0–10.5)

## 2013-11-02 LAB — PHOSPHORUS
Phosphorus: 1 mg/dL — CL (ref 2.3–4.6)
Phosphorus: 1.4 mg/dL — ABNORMAL LOW (ref 2.3–4.6)
Phosphorus: 1.7 mg/dL — ABNORMAL LOW (ref 2.3–4.6)

## 2013-11-02 LAB — GLUCOSE, CAPILLARY
Glucose-Capillary: 169 mg/dL — ABNORMAL HIGH (ref 70–99)
Glucose-Capillary: 154 mg/dL — ABNORMAL HIGH (ref 70–99)
Glucose-Capillary: 176 mg/dL — ABNORMAL HIGH (ref 70–99)
Glucose-Capillary: 176 mg/dL — ABNORMAL HIGH (ref 70–99)

## 2013-11-02 LAB — POTASSIUM: Potassium: 2.9 mEq/L — CL (ref 3.7–5.3)

## 2013-11-02 LAB — AMMONIA: AMMONIA: 39 umol/L (ref 11–60)

## 2013-11-02 MED ORDER — SODIUM CHLORIDE 0.9 % IV SOLN
INTRAVENOUS | Status: DC
  Administered 2013-11-02: 12:00:00 via INTRAVENOUS

## 2013-11-02 MED ORDER — BUPROPION HCL 100 MG PO TABS
100.0000 mg | ORAL_TABLET | Freq: Every day | ORAL | Status: DC
  Filled 2013-11-02: qty 1

## 2013-11-02 MED ORDER — POTASSIUM PHOSPHATES 15 MMOLE/5ML IV SOLN
20.0000 mmol | Freq: Once | INTRAVENOUS | Status: AC
  Administered 2013-11-02: 20 mmol via INTRAVENOUS
  Filled 2013-11-02: qty 6.67

## 2013-11-02 MED ORDER — SODIUM PHOSPHATE 3 MMOLE/ML IV SOLN
30.0000 mmol | Freq: Once | INTRAVENOUS | Status: AC
  Administered 2013-11-02: 30 mmol via INTRAVENOUS
  Filled 2013-11-02: qty 10

## 2013-11-02 MED ORDER — POTASSIUM CHLORIDE 20 MEQ/15ML (10%) PO LIQD
40.0000 meq | ORAL | Status: AC
  Administered 2013-11-02 (×2): 40 meq
  Filled 2013-11-02 (×2): qty 30

## 2013-11-02 MED ORDER — VANCOMYCIN HCL IN DEXTROSE 1-5 GM/200ML-% IV SOLN: 1000.0000 mg | Freq: Two times a day (BID) | INTRAVENOUS | Status: DC

## 2013-11-02 MED ORDER — VANCOMYCIN HCL 1000 MG IV SOLR
1000.0000 mg | Freq: Two times a day (BID) | INTRAVENOUS | Status: DC
  Administered 2013-11-02 – 2013-11-03 (×4): 1000 mg via INTRAVENOUS
  Filled 2013-11-02 (×6): qty 1000

## 2013-11-02 NOTE — Progress Notes (Signed)
eLink Physician-Brief Progress Note Patient Name: [name] MilchDavid C Govea DOB: Feb 28, 1956 MRN: [id number]  Date of Service  11/02/2013   HPI/Events of Note   K and Ph still low.  eICU Interventions  Will give additional K Phos   Intervention Category Major Interventions: Other:  Bevan Vu 11/02/2013, 10:45 PM

## 2013-11-02 NOTE — Progress Notes (Signed)
eLink Physician-Brief Progress Note Patient Name: [name] MilchDavid C [name] DOB: [birth year] MRN: [id number]  Date of Service  11/02/2013   HPI/Events of Note   Lab Results  Component Value Date   CREATININE 1.31 11/02/2013   BUN 16 11/02/2013   NA 126* 11/02/2013   K 2.9* 11/02/2013   CL 84* 11/02/2013   CO2 13* 11/02/2013   Lab Results  Component Value Date   CALCIUM 6.9* 11/02/2013   PHOS 1.4* 11/02/2013     eICU Interventions   Will give K-phos IV   Intervention Category Major Interventions: Other:  SOOD,VINEET 11/02/2013, 3:26 PM

## 2013-11-02 NOTE — Progress Notes (Signed)
PT Cancellation Note  Patient Details Name: [name] MilchDavid C [name] MRN: [id number] DOB: 05/06/1956   Cancelled Treatment:    Reason Eval/Treat Not Completed: Medical issues which prohibited therapy (intubation)   Rada HayHill, Karen Elizabeth 11/02/2013, 7:52 AM Blanchard KelchKaren Hill PT [phone]

## 2013-11-02 NOTE — Progress Notes (Signed)
Respiratory and RN at bedside with ambu bag set up during period of no power.  Vent and sedation remained on via battery power. VSS. Patient calm and cooperative.

## 2013-11-02 NOTE — Progress Notes (Signed)
CRITICAL VALUE ALERT  Critical value received: Potassium 2.9  Date of notification:  11/02/13  Time of notification: 1515  Critical value read back: potassium 2.9  Nurse who received alert:  Santiago GladAllison Koontz RN  MD notified (1st page):  Dr Craige CottaSood  Time of first page:1515  MD notified (2nd page):  Time of second page:  Responding MD:  Dr Craige CottaSood  Time MD responded: [phone]

## 2013-11-02 NOTE — Progress Notes (Signed)
NUTRITION FOLLOW UP  Intervention:   - Continue TF via OGT of Vital High Protein at 34m/hr and increase by 127mevery 12 hours to goal of 6066m with Prostat 20m20mD. Goal rate will provide 1640 calories, 156g protein, and 1204ml17me water that will meet 68% estimated calorie needs, 100% estimated protein needs - RD to monitor refeeding labs and TF advancement   Nutrition Dx:   Inadequate oral intake related to inability to eat as evidenced by NPO - ongoing   Goal:   Enteral nutrition to provide 60-70% of estimated calorie needs (22-25 kcals/kg ideal body weight) and 100% of estimated protein needs, based on ASPEN guidelines for permissive underfeeding in critically ill obese individuals - not met yet due to slow advancement as pt at refeeding risk and has low potassium and phosphorus   Monitor:   Weights, labs, TF tolerance/advancement, vent status   Assessment:   Pt with prior h/o hypertension, dm, alcohol abuse, came in to the hospital for alcohol detox. Patient appears confused and most of the history was available from the patients wife. As per the wife, pt has a h/o alcohol abuse for more than 30 years, pt's father died 2 months ago and since then patient has been depressed and drinking more.   6/29: Experienced increased tachypnea, lethargy, with elevated heart rate this afternoon with pt being as responsive as previously per RN notes. Intubated today for airway protection and maintenance. Alone in room on vent. Started TF via OGT of Vital High Protein at 20ml/72mncrease by 10ml e76m 12 hours to goal of 60ml/hr79mh Prostat 20ml BID55mal rate will provide 1640 calories, 156g protein, and 1204ml free66mer that will meet 72% estimated calorie needs, 100% estimated protein needs from verbal order from MD for RD to manage TF. Advanced slowly due to refeeding risk   6/30: Remains on vent, alone in room. Per RN, pt without any TF residuals. TF increased to 20ml this 78ming of Vital  High Protein.   Potassium and phosphorus low and are being replaced    Potassium  Date/Time Value Ref Range Status  11/02/2013  4:00 AM 2.8* 3.7 - 5.3 mEq/L Final     REPEATED TO VERIFY     CRITICAL RESULT CALLED TO, READ BACK BY AND VERIFIED WITH:     TIFFANY BANAudree [id number] SCOTTON  11/01/2013  9:03 PM 3.2* 3.7 - 5.3 mEq/L Final  11/01/2013  4:15 AM 3.5* 3.7 - 5.3 mEq/L Final    Phosphorus  Date/Time Value Ref Range Status  11/02/2013  4:00 AM 1.0* 2.3 - 4.6 mg/dL Final     REPEATED TO VERIFY     CRITICAL RESULT CALLED TO, READ BACK BY AND VERIFIED WITH:     TIFFANY BANAudree [id number] SCOTTON  06/08/2008  8:30 PM 4.1  2.3 - 4.6 mg/dL Final  03/07/2008  8:52 AM 4.2   Final    Magnesium  Date/Time Value Ref Range Status  11/02/2013  4:00 AM 2.0  1.5 - 2.5 mg/dL Final  11/01/2013  6:42 AM 1.6  1.5 - 2.5 mg/dL Final  06/13/2008  7:20 AM 1.3* 1.5 - 2.5 mg/dL Final     TF: Vital High Protein at 20ml/hr wit42mostat 20ml BID - p93mdes 920 calories, 93g protein, and 602ml free wat22mhich meets 38% estimated calorie needs, 60% estimated protein needs  Patient is currently intubated on ventilator support MV: 12.3 L/min Temp (24hrs), Avg:99.1 F (37.3 C),  Min:97.6 F (36.4 C), Max:100.8 F (38.2 C)  Propofol: off    Height: Ht Readings from Last 1 Encounters:  11/01/13 5' 11"  (1.803 m)    Weight Status:   Wt Readings from Last 1 Encounters:  10/31/13 240 lb 1.3 oz (108.9 kg)    Re-estimated needs:  Kcal: 2411 Protein:156g Fluid: per MD  Skin: Intact   Diet Order: NPO   Intake/Output Summary (Last 24 hours) at 11/02/13 1009 Last data filed at 11/02/13 0855  Gross per 24 hour  Intake 6912.02 ml  Output    475 ml  Net 6437.02 ml    Last BM: 6/29   Labs:   Recent Labs Lab 11/01/13 0415 11/01/13 0642 11/01/13 2103 11/02/13 0400  NA 129*  --  127* 126*  K 3.5*  --  3.2* 2.8*  CL 84*  --  85* 84*  CO2 9*  --  10* 13*  BUN 19  --   17 16  CREATININE 1.78*  --  1.38* 1.31  CALCIUM 7.9*  --  7.1* 6.9*  MG  --  1.6  --  2.0  PHOS  --   --   --  1.0*  GLUCOSE 71  --  140* 170*    CBG (last 3)   Recent Labs  11/01/13 2044 11/01/13 2338 11/02/13 0342  GLUCAP 148* 174* 169*    Scheduled Meds: . antiseptic oral rinse  15 mL Mouth Rinse QID  . chlorhexidine  15 mL Mouth Rinse BID  . enoxaparin (LOVENOX) injection  40 mg Subcutaneous Q1200  . feeding supplement (PRO-STAT SUGAR FREE 64)  30 mL Per Tube BID  . feeding supplement (VITAL HIGH PROTEIN)  1,000 mL Per Tube Q24H  . insulin aspart  0-9 Units Subcutaneous 6 times per day  . pantoprazole sodium  40 mg Per Tube Daily  . piperacillin-tazobactam (ZOSYN)  IV  3.375 g Intravenous 3 times per day  . pneumococcal 23 valent vaccine  0.5 mL Intramuscular Tomorrow-1000  . sodium phosphate  Dextrose 5% IVPB  30 mmol Intravenous Once  . thiamine  100 mg Oral Daily   Or  . thiamine  100 mg Intravenous Daily  . vancomycin (VANCOCIN) 1000 mg IVPB  1,000 mg Intravenous Q12H    Continuous Infusions: . sodium chloride 150 mL/hr at 11/02/13 1009  . fentaNYL infusion INTRAVENOUS 250 mcg/hr (11/02/13 0855)  . norepinephrine (LEVOPHED) Adult infusion 2 mcg/min (11/02/13 0740)  .  sodium bicarbonate  infusion 1000 mL 125 mL/hr at 11/02/13 [place], RD, LDN [phone] Pager [phone] Weekend/After Hours Pager

## 2013-11-02 NOTE — Progress Notes (Signed)
ANTIBIOTIC CONSULT NOTE   Pharmacy Consult for vancomycin / Zosyn Indication: sepsis  No Known Allergies  Patient Measurements: Height: 5\' 11"  (180.3 cm) Weight: 240 lb 1.3 oz (108.9 kg) IBW/kg (Calculated) : 75.3   Vital Signs: Temp: 100.3 F (37.9 C) (06/30 0800) Temp src: Axillary (06/30 0800) BP: 118/61 mmHg (06/30 0341) Pulse Rate: 85 (06/30 0800) Intake/Output from previous day: 06/29 0701 - 06/30 0700 In: [id number] [I.V.:5038; NG/GT:180; IV Piggyback:1000] Out: 475 [Urine:475] Intake/Output from this shift: Total I/O In: 299 [I.V.:34; Other:125; NG/GT:140] Out: -   Labs:  Recent Labs  10/30/13 1527  11/01/13 0415 11/01/13 0643 11/01/13 2103 11/02/13 0400  WBC 12.8*  --  10.4  --   --  8.1  HGB 13.5  --  10.7*  --   --  9.9*  PLT 145*  --  107*  --   --  107*  LABCREA  --   --   --  185.4  --   --   CREATININE 1.88*  < > 1.78*  --  1.38* 1.31  < > = values in this interval not displayed. Estimated Creatinine Clearance: 78.1 ml/min (by C-G formula based on Cr of 1.31). No results found for this basename: VANCOTROUGH, Leodis BinetVANCOPEAK, VANCORANDOM, GENTTROUGH, GENTPEAK, GENTRANDOM, TOBRATROUGH, TOBRAPEAK, TOBRARND, AMIKACINPEAK, AMIKACINTROU, AMIKACIN,  in the last 72 hours   Microbiology: Recent Results (from the past 720 hour(s))  MRSA PCR SCREENING     Status: None   Collection Time    11/01/13  8:31 AM      Result Value Ref Range Status   MRSA by PCR NEGATIVE  NEGATIVE Final   Comment:            The GeneXpert MRSA Assay (FDA     approved for NASAL specimens     only), is one component of a     comprehensive MRSA colonization     surveillance program. It is not     intended to diagnose MRSA     infection nor to guide or     monitor treatment for     MRSA infections.    Medical History: Past Medical History  Diagnosis Date  . Hypertension   . Alcohol abuse     Drinks 1-2 large bottle wine per day  . Depression     Medications:  Scheduled:  .  antiseptic oral rinse  15 mL Mouth Rinse QID  . buPROPion  100 mg Oral Daily  . chlorhexidine  15 mL Mouth Rinse BID  . enoxaparin (LOVENOX) injection  40 mg Subcutaneous Q1200  . feeding supplement (PRO-STAT SUGAR FREE 64)  30 mL Per Tube BID  . feeding supplement (VITAL HIGH PROTEIN)  1,000 mL Per Tube Q24H  . FLUoxetine  40 mg Oral Daily  . insulin aspart  0-9 Units Subcutaneous 6 times per day  . metoprolol  5 mg Intravenous 4 times per day  . pantoprazole sodium  40 mg Per Tube Daily  . piperacillin-tazobactam (ZOSYN)  IV  3.375 g Intravenous 3 times per day  . pneumococcal 23 valent vaccine  0.5 mL Intramuscular Tomorrow-1000  . potassium chloride  40 mEq Per Tube Q4H  . sodium phosphate  Dextrose 5% IVPB  30 mmol Intravenous Once  . thiamine  100 mg Oral Daily   Or  . thiamine  100 mg Intravenous Daily  . vancomycin (VANCOCIN) 1000 mg IVPB  1,000 mg Intravenous Q12H   Infusions:  . sodium chloride 125 mL/hr at 11/02/13  [id number]  . dexmedetomidine Stopped (11/02/13 0856)  . fentaNYL infusion INTRAVENOUS 250 mcg/hr (11/02/13 0855)  . norepinephrine (LEVOPHED) Adult infusion 2 mcg/min (11/02/13 0740)  .  sodium bicarbonate  infusion 1000 mL 125 mL/hr at 11/02/13 0420   PRN: acetaminophen, ibuprofen, LORazepam, ondansetron  Assessment: 58 y/o M brought to hospital for alcohol detoxification, was found to have electrolyte abnormalities and admitted to ICU/SD.  Now with findings suggestive of sepsis - to begin empiric Zosyn and vancomycin with pharmacy dosing assistance requested.  Goal of Therapy:  Appropriate antibiotic dosing for renal function; eradication of infection. Vancomycin trough 15-20  6/30:  D#2 vancomycin 2000 mg IV x 1 then 1500 mg IV q24h / Zosyn 3.375 grams IV q8h by extended-infusion.  SCr steadily improving.  Estimated normalized creatinine clearance now > 60 mL/min/72kg.  Blood cultures pending.  Plan:  1. Increase Vancomycin to 1000 mg IV q12h, next dose  10am 2. Continue Zosyn 3.375 grams IV q8h, each dose infused over 4 hours. 3. Follow serum creatinine, culture results, clinical course. 4. Check vancomycin trough at steady-state unless de-escalation occurs in the interim.  Elie Goodyandy Absher, PharmD, BCPS Pager: [phone] 11/02/2013  9:10 AM

## 2013-11-02 NOTE — Progress Notes (Signed)
Echo Lab  2D Echocardiogram completed.  Melissa L Morford, RDCS 11/02/2013 9:00 AM   

## 2013-11-02 NOTE — Progress Notes (Signed)
[facility] FOR AM LAB REPLACEMENT ONLY  The patient does apply for the [facility] Electrolyte Replacment Protocol based on the criteria listed below:   1. Is GFR >/= 40 ml/min? Yes.    Patient's GFR today is 59 2. Is urine output >/= 0.5 ml/kg/hr for the last 6 hours? Yes.   Patient's UOP is 0.6 ml/kg/hr 3. Is BUN < 60 mg/dL? Yes.    Patient's BUN today is 16 4. Abnormal electrolyte(s): K 2.8, Phos 1.0 5. Ordered repletion with: per protocol 6. If [name] panic level lab has been reported, has the CCM MD in charge been notified? Yes.  .   Physician:  Dr Darrick Pennadeterding  [name], [name] 11/02/2013 5:16 AM

## 2013-11-02 NOTE — Progress Notes (Signed)
PULMONARY / CRITICAL CARE MEDICINE   Name: [name] MilchDavid C [name] MRN: [id number] DOB: Feb 19, 1956    ADMISSION DATE:  10/30/2013 CONSULTATION DATE: 11/01/13  REFERRING MD : Dr. Blake DivineAkula  PRIMARY SERVICE: TRH  CHIEF COMPLAINT: ETOH Withdrawal    BRIEF PATIENT DESCRIPTION: 58 y.o. male with prior h/o hypertension, DM, alcohol abuse, who presented [facility] 6/27 for ETOH detox.  Found to have hyponatremia & metabolic acidosis. Admitted to the medical floor and decompensated with tachycardia, AMS, respiratory failure.  Tx to ICU 6/29 requiring intubation / TLC, & precedex.     SIGNIFICANT EVENTS:  6/27 - admitted for ETOH detox 6/29 - tachycardia (160 bpm) and irregular respiratory pattern and increased work of breathing, PCCM to see.  6/30 - Hematuria, continues to need pressors   STUDIES:  6/29 - Abd U/S:  Hepatic steatosis consistent with hepatic cirrhosis, portal vein with hepatopetal flow  LINES / TUBES:  OETT 6/29 >>> R IJ 6/29 >>> R radial arterial line >>>  CULTURES:  6/27 Staph aureus PCR >> positive (neg MRSA)  6/29 Blood cultures >>>  ANTIBIOTICS:  Vancomycin 6/29 >>>  Zosyn 6/29 >>>    SUBJECTIVE/OVERNIGHT: RN reports pt developed hematuria this AM. He also continues to be on levophed gtt and bicarb gtt. Heart rate is more controlled and appears to be a-fib and around 85 bpm. He is also oxygentating better on the ventilator. He is on fentanyl and precedex gtt for pain and agitation.   VITAL SIGNS: Temp:  [97.6 F (36.4 C)-100.8 F (38.2 C)] 98.9 F (37.2 C) (06/30 0400) Pulse Rate:  [41-152] 126 (06/30 0645) Resp:  [13-30] 16 (06/30 0645) BP: (83-135)/(55-75) 118/61 mmHg (06/30 0341) SpO2:  [95 %-100 %] 98 % (06/30 0645) FiO2 (%):  [30 %-100 %] 30 % (06/30 0341) HEMODYNAMICS: CVP:  [12 mmHg-13 mmHg] 13 mmHg VENTILATOR SETTINGS: Vent Mode:  [-] PRVC FiO2 (%):  [30 %-100 %] 30 % Set Rate:  [20 bmp-25 bmp] 25 bmp Vt Set:  [600 mL] 600 mL PEEP:  [5 cmH20] 5 cmH20 Plateau  Pressure:  [20 cmH20-25 cmH20] 25 cmH20 INTAKE / OUTPUT: Intake/Output     06/29 0701 - 06/30 0700 06/30 0701 - 07/01 0700   P.O.     I.V. (mL/kg) 4751.7 (43.6)    Other 575    NG/GT 160    IV Piggyback 750    Total Intake(mL/kg) 6236.7 (57.3)    Urine (mL/kg/hr) 475 (0.2)    Total Output 475     Net +5761.7           PHYSICAL EXAMINATION: General: chronically ill appearing male in NAD Neuro: Intubated, sedated HEENT: pin-point pupils, R IJ in place, OETT Cardiovascular: irregularly irregular, rate controlled Lungs: CTA  Abdomen: soft, non-distended, non-tender GI/GU: hematuria without clots, no diarrhea  Musculoskeletal: MAEs  Skin: intact  LABS:  CBC  Recent Labs Lab 10/30/13 1527 11/01/13 0415 11/02/13 0400  WBC 12.8* 10.4 8.1  HGB 13.5 10.7* 9.9*  HCT 37.8* 31.0* 28.9*  PLT 145* 107* 107*   Coag's No results found for this basename: APTT, INR,  in the last 168 hours BMET  Recent Labs Lab 11/01/13 0415 11/01/13 2103 11/02/13 0400  NA 129* 127* 126*  K 3.5* 3.2* 2.8*  CL 84* 85* 84*  CO2 9* 10* 13*  BUN 19 17 16   CREATININE 1.78* 1.38* 1.31  GLUCOSE 71 140* 170*   Electrolytes  Recent Labs Lab 11/01/13 0415 11/01/13 0642 11/01/13 2103 11/02/13 0400  CALCIUM 7.9*  --  7.1* 6.9*  MG  --  1.6  --  2.0  PHOS  --   --   --  1.0*   Sepsis Markers  Recent Labs Lab 11/01/13 0642 11/01/13 1115  LATICACIDVEN 2.0 1.7   ABG  Recent Labs Lab 11/01/13 1330 11/01/13 1630 11/02/13 0355  PHART 7.214* 7.320* 7.382  PCO2ART 31.3* 22.5* 22.2*  PO2ART 473.0* 172.0* 129.0*   Liver Enzymes  Recent Labs Lab 10/30/13 1527 11/01/13 1115 11/02/13 0400  AST 450* 177* 111*  ALT 351* 215* 157*  ALKPHOS 176* 152* 144*  BILITOT 4.4* 4.5* 4.1*  ALBUMIN 3.7 2.7* 2.3*   Cardiac Enzymes  Recent Labs Lab 11/01/13 1321  TROPONINI <0.30   Glucose  Recent Labs Lab 10/31/13 2141 11/01/13 0605 11/01/13 1201 11/01/13 1542 11/01/13 2044  11/01/13 2338  GLUCAP 121* 77 114* 139* 148* 174*    Imaging Dg Chest 2 View  10/31/2013   CLINICAL DATA:  58 year old male with shortness of breath. Hypertension. Cough. Query aspiration. Initial encounter.  EXAM: CHEST  2 VIEW  COMPARISON:  10/30/2009.  FINDINGS: Semi upright AP and lateral views of the chest. Lower lung volumes with more elevation of the right hemidiaphragm. Normal cardiac size and mediastinal contours. Visualized tracheal air column is within normal limits. No confluent pulmonary opacity or consolidation. No pneumothorax, edema, or pleural effusion. No acute osseous abnormality identified.  IMPRESSION: Lower lung volumes, otherwise no acute cardiopulmonary abnormality.   Electronically Signed   By: Augusto GambleLee  Hassie Mandt M.D.   On: 10/31/2013 16:24   Dg Abd 1 View  11/01/2013   CLINICAL DATA:  Orogastric tube placement.  EXAM: ABDOMEN - 1 VIEW  COMPARISON:  None.  FINDINGS: Orogastric tube extends into the esophagus with the tip located in the chest at the juncture of the mid and distal esophagus. Vague soft tissue density is seen in the abdomen with no evidence of bowel obstruction or significant ileus. There may be underlying ascites.  IMPRESSION: Orogastric tube tip lies in the esophagus at the juncture of the mid and distal thirds. Possible ascites.   Electronically Signed   By: Irish LackGlenn  Yamagata M.D.   On: 11/01/2013 17:23   [place] Abdomen Complete  11/01/2013   CLINICAL DATA:  Critically ill intubated patient in the ICU with elevated LFTs. Prior history of alcoholic hepatitis.  EXAM: ULTRASOUND ABDOMEN COMPLETE  COMPARISON:  03/02/2008.  FINDINGS: Gallbladder:  No shadowing gallstones or echogenic sludge. No gallbladder wall thickening or pericholecystic fluid. Negative sonographic Murphy sign according to the ultrasound technologist.  Common bile duct:  Diameter: Approximately 8 mm centrally.  Liver:  Diffusely increased and coarsened echotexture without focal hepatic parenchymal abnormality.  Relatively enlarged left lobe relative to the right lobe, with subtle irregularity in the hepatic contour. Patent portal vein with hepatopetal flow.  IVC:  Patent in its intrahepatic portion. Not visualized outside of the liver.  Pancreas:  Normal-appearing head and body. Tail obscured by overlying bowel gas.  Spleen:  Normal size and echotexture without focal parenchymal abnormality.  Right Kidney:  Length: Approximately 11.0 cm. No hydronephrosis. Well-preserved cortex. No shadowing calculi. Normal parenchymal echotexture without focal abnormalities.  Left Kidney:  Length: Approximately 11.0 cm. No hydronephrosis. Well-preserved cortex. No shadowing calculi. Normal parenchymal echotexture without focal abnormalities.  Abdominal aorta:  Normal in caliber throughout its visualized course in the abdomen with evidence of atherosclerosis. Obscured distally by overlying bowel gas. Maximum diameter 2.3 cm.  Other findings:  None.  IMPRESSION: 1. Diffuse hepatic steatosis without  focal hepatic parenchymal abnormality. Sonographic findings consistent with hepatic cirrhosis. 2. Patent portal vein with hepatopetal flow. 3. Otherwise normal examination with a caveat that the extrahepatic IVC, the pancreatic tail, and the distal abdominal aorta were obscured by bowel gas or therefore not evaluated.   Electronically Signed   By: Hulan Saas M.D.   On: 11/01/2013 18:28   Dg Chest Port 1 View  11/02/2013   CLINICAL DATA:  Endotracheal tube placement  EXAM: PORTABLE CHEST - 1 VIEW  COMPARISON:  11/01/2013  FINDINGS: Endotracheal tube is in good position. Central venous catheter tip at the lower SVC. NG tube enters the stomach.  Bibasilar atelectasis unchanged.  Negative for edema or effusion.  IMPRESSION: Support lines in good position. Mild bibasilar atelectasis is unchanged.   Electronically Signed   By: Marlan Palau M.D.   On: 11/02/2013 07:18   Dg Chest Port 1 View  11/01/2013   CLINICAL DATA:  Line placement and  endotracheal tube placement  EXAM: PORTABLE CHEST - 1 VIEW  COMPARISON:  October 31, 2013  FINDINGS: The heart size and mediastinal contours are stable. Endotracheal tube is identified with distal tip 2.1 cm from carina. Retraction by 2 cm is recommended. A right jugular central venous line is identified with distal tip in the superior vena cava. There is mild central pulmonary vascular congestion. There is no pneumothorax. There is no focal pneumonia. The visualized skeletal structures are stable.  IMPRESSION: Endotracheal tube identified with distal tip 2.1 cm from carina. Retraction by 2 cm is recommended. There is no pneumothorax. Right jugular central venous line in good position.   Electronically Signed   By: Sherian Rein M.D.   On: 11/01/2013 15:24    ASSESSMENT / PLAN:  NEUROLOGIC  A:  Acute encephalopathy related to ETOH Withdrawal / Liver disease Depression - father passed away 2 mo pta, drinking has increased since.   P:  RASS goal: -2 Ct fentanyl  D/c precedex Hold wellbutrin, prozac Ct Ativan PRN  Consider addition of low dose scheduled PT ativan  Restart Ambien when off ventilator  GASTROINTESTINAL  A:  Elevated Liver enzymes - h/o etoh abuse  Cirrhosis - noted on abd U/S 6/29. Ammonia 6/30:  39 Diarrhea - improved P:  Ct Bicarb gtt with 5% dextrose  Continue Tube feeds Consider Abd Ct?   HEMATOLOGIC  A:  Anemia - related to GI losses, hematuria Hematuria 6/30 Thrombocytopenia - stable P:  Transfuse <7  Hold Lovenox d/t hematuria  PULMONARY  A:  Acute Respiratory Failure - secondary to ETOH withdrawal  Metabolic Acidosis with co-comitant Respiratory Alkalosis - elevated anion gap (33) secondary to excessive GI losses and starvation - elevated serum ketones 6/29  P:  Adjust vent settings: rate to 20 PSV wean as tolerated Daily SBT / WUA F/u ABG this afternoon and in AM CXR PRN  CARDIOVASCULAR  A:  Tachycardia - resolved Hypotension - persistent, continues  to need levophed.  Troponin neg  H/o RBBB  A-fib - new onset during acute phase, noted 6/29  P:  Repeat EKG this AM Hold IV lopressor Continue Levophed for MAP >65 Trend CEs  F/u Echo  Monitor for conversion to NSR  RENAL  A:  Metabolic Acidosis - unclear etiology, decreased PO intake - likely alcoholic ketoacidosis. Serum ketones elevated (moderate).  Anion gap 33 6/30 Hypokalemia Hyponatremia ? Hematuria - concern for catheter related trauma in setting of thrombocytopenia  P:  Increased NS to 150 ml/hr Replace electrolytes as indicated  Adjust bicarb gtt @ 179ml/hr  F/u daily BMPs at noon  F/u CMP, Mag, and phos in AM Assess urine Na, Osmol  INFECTIOUS  A:  Diarrhea - ongoing for past 6 months. Increased in last 2 wks pta - improved.   R/O infectious etiology of decompensation P:  Started empiric Vanc/Zosyn 6/29  F/u Blood cultures  Assess stool culture and c-diff   ENDOCRINE  A:  No acute issues.  TSH 6/29: 1.290  Cortisol 6/29: 23.6  Hgb A1C 6/28: 5.8  P:  CBG monitoring   Summary: Patient with minimal ventilator settings today. ABG with persistent metabolic acidosis and respiratory alkalosis on bicarb gtt. Will re-assess ABG this afternoon. Continues to need levophed for hypotension, will try weaning sedation today to see if this improves hypotension. Patient developed hematuria overnight - h&h and platelets stable, could be a traumatic catheterization - consider holding lovenox and renal u/s if no improvement.  Elsie Amis, ACNP Student   Canary Brim, NP-C  [facility]  Pgr: [phone] or [phone]  Attending:  I have seen and examined the patient with nurse practitioner/resident and agree with the note above.   Acidosis (multifactorial from diarrhea, ketosis) is improving reflected in decreased RR Volume depletion better Not sure if he is septic or not, no clear source Plan work up hyponatremia, continue vent today, likely  extubation in AM  I have personally obtained a history, examined the patient, evaluated laboratory and imaging results, formulated the assessment and plan and placed orders.  CRITICAL CARE: The patient is critically ill with multiple organ systems failure and requires high complexity decision making for assessment and support, frequent evaluation and titration of therapies, application of advanced monitoring technologies and extensive interpretation of multiple databases. Critical Care Time devoted to patient care services described in this note is 40 minutes.   [place], MD [facility] Pager: [phone] Cell: [phone] If no response, call [phone]  11/02/2013, 7:26 AM

## 2013-11-02 NOTE — Progress Notes (Signed)
OT Cancellation Note  Patient Details Name: Daralene MilchDavid C Stabler MRN: [id number] DOB: [birth year]   Cancelled Treatment:    Reason Eval/Treat Not Completed: Medical issues which prohibited therapy.  Pt is on a vent  SPENCER,MARYELLEN 11/02/2013, 10:43 AM Marica OtterMaryellen Spencer, OTR/L [phone] 11/02/2013

## 2013-11-03 DIAGNOSIS — R509 Fever, unspecified: Secondary | ICD-10-CM

## 2013-11-03 LAB — BLOOD GAS, ARTERIAL
Acid-Base Excess: 2.1 mmol/L — ABNORMAL HIGH (ref 0.0–2.0)
Bicarbonate: 26.2 mEq/L — ABNORMAL HIGH (ref 20.0–24.0)
DRAWN BY: 232811
FIO2: 0.3 %
LHR: 20 {breaths}/min
O2 SAT: 98.7 %
PEEP: 5 cmH2O
Patient temperature: 98.2
TCO2: 24.3 mmol/L (ref 0–100)
VT: 600 mL
pCO2 arterial: 40.3 mmHg (ref 35.0–45.0)
pH, Arterial: 7.427 (ref 7.350–7.450)
pO2, Arterial: 108 mmHg — ABNORMAL HIGH (ref 80.0–100.0)

## 2013-11-03 LAB — COMPREHENSIVE METABOLIC PANEL
ALT: 133 U/L — ABNORMAL HIGH (ref 0–53)
AST: 127 U/L — ABNORMAL HIGH (ref 0–37)
Albumin: 2.1 g/dL — ABNORMAL LOW (ref 3.5–5.2)
Alkaline Phosphatase: 149 U/L — ABNORMAL HIGH (ref 39–117)
BILIRUBIN TOTAL: 4 mg/dL — AB (ref 0.3–1.2)
BUN: 15 mg/dL (ref 6–23)
CHLORIDE: 94 meq/L — AB (ref 96–112)
CO2: 25 mEq/L (ref 19–32)
CREATININE: 1.13 mg/dL (ref 0.50–1.35)
Calcium: 6.6 mg/dL — ABNORMAL LOW (ref 8.4–10.5)
GFR calc non Af Amer: 70 mL/min — ABNORMAL LOW (ref >90)
GFR, EST AFRICAN AMERICAN: 82 mL/min — AB (ref >90)
Glucose, Bld: 166 mg/dL — ABNORMAL HIGH (ref 70–99)
Potassium: 2.8 mEq/L — CL (ref 3.7–5.3)
Sodium: 136 mEq/L — ABNORMAL LOW (ref 137–147)
TOTAL PROTEIN: 4.8 g/dL — AB (ref 6.0–8.3)

## 2013-11-03 LAB — OSMOLALITY, URINE: OSMOLALITY UR: 443 mosm/kg (ref 390–1090)

## 2013-11-03 LAB — CBC
HCT: 27.8 % — ABNORMAL LOW (ref 39.0–52.0)
Hemoglobin: 9.5 g/dL — ABNORMAL LOW (ref 13.0–17.0)
MCH: 34.1 pg — ABNORMAL HIGH (ref 26.0–34.0)
MCHC: 34.2 g/dL (ref 30.0–36.0)
MCV: 99.6 fL (ref 78.0–100.0)
PLATELETS: 116 10*3/uL — AB (ref 150–400)
RBC: 2.79 MIL/uL — ABNORMAL LOW (ref 4.22–5.81)
RDW: 15 % (ref 11.5–15.5)
WBC: 6.4 10*3/uL (ref 4.0–10.5)

## 2013-11-03 LAB — GLUCOSE, CAPILLARY
GLUCOSE-CAPILLARY: 166 mg/dL — AB (ref 70–99)
GLUCOSE-CAPILLARY: 165 mg/dL — AB (ref 70–99)
GLUCOSE-CAPILLARY: 89 mg/dL (ref 70–99)
GLUCOSE-CAPILLARY: 73 mg/dL (ref 70–99)
Glucose-Capillary: 212 mg/dL — ABNORMAL HIGH (ref 70–99)
Glucose-Capillary: 171 mg/dL — ABNORMAL HIGH (ref 70–99)
Glucose-Capillary: 114 mg/dL — ABNORMAL HIGH (ref 70–99)

## 2013-11-03 LAB — VANCOMYCIN, TROUGH: Vancomycin Tr: 13.4 ug/mL (ref 10.0–20.0)

## 2013-11-03 LAB — BASIC METABOLIC PANEL
Anion gap: 16 — ABNORMAL HIGH (ref 5–15)
BUN: 15 mg/dL (ref 6–23)
BUN: 17 mg/dL (ref 6–23)
CALCIUM: 6.3 mg/dL — AB (ref 8.4–10.5)
CHLORIDE: 93 meq/L — AB (ref 96–112)
CO2: 25 mEq/L (ref 19–32)
CO2: 24 mEq/L (ref 19–32)
Calcium: 6.4 mg/dL — CL (ref 8.4–10.5)
Chloride: 95 mEq/L — ABNORMAL LOW (ref 96–112)
Creatinine, Ser: 1.21 mg/dL (ref 0.50–1.35)
Creatinine, Ser: 1.56 mg/dL — ABNORMAL HIGH (ref 0.50–1.35)
GFR calc Af Amer: 75 mL/min — ABNORMAL LOW (ref >90)
GFR, EST AFRICAN AMERICAN: 55 mL/min — AB (ref >90)
GFR, EST NON AFRICAN AMERICAN: 65 mL/min — AB (ref >90)
GFR, EST NON AFRICAN AMERICAN: 48 mL/min — AB (ref >90)
GLUCOSE: 111 mg/dL — AB (ref 70–99)
Glucose, Bld: 69 mg/dL — ABNORMAL LOW (ref 70–99)
POTASSIUM: 3.6 meq/L — AB (ref 3.7–5.3)
Potassium: 3.6 mEq/L — ABNORMAL LOW (ref 3.7–5.3)
Sodium: 136 mEq/L — ABNORMAL LOW (ref 137–147)
Sodium: 134 mEq/L — ABNORMAL LOW (ref 137–147)

## 2013-11-03 LAB — AMMONIA: Ammonia: 62 umol/L — ABNORMAL HIGH (ref 11–60)

## 2013-11-03 LAB — MAGNESIUM: Magnesium: 1.6 mg/dL (ref 1.5–2.5)

## 2013-11-03 LAB — SODIUM, URINE, RANDOM

## 2013-11-03 LAB — CLOSTRIDIUM DIFFICILE BY PCR: CDIFFPCR: NEGATIVE

## 2013-11-03 LAB — PHOSPHORUS: Phosphorus: 1.7 mg/dL — ABNORMAL LOW (ref 2.3–4.6)

## 2013-11-03 MED ORDER — FENTANYL CITRATE 0.05 MG/ML IJ SOLN: 12.5000 ug | INTRAMUSCULAR | Status: DC | PRN

## 2013-11-03 MED ORDER — SODIUM CHLORIDE 0.9 % IV BOLUS (SEPSIS)
500.0000 mL | Freq: Once | INTRAVENOUS | Status: AC
  Administered 2013-11-03: 500 mL via INTRAVENOUS

## 2013-11-03 MED ORDER — POTASSIUM CHLORIDE 20 MEQ/15ML (10%) PO LIQD
40.0000 meq | ORAL | Status: AC
  Administered 2013-11-03 (×2): 40 meq
  Filled 2013-11-03 (×2): qty 30

## 2013-11-03 MED ORDER — METOPROLOL TARTRATE 1 MG/ML IV SOLN
2.5000 mg | Freq: Four times a day (QID) | INTRAVENOUS | Status: DC
  Administered 2013-11-03 – 2013-11-04 (×2): 2.5 mg via INTRAVENOUS
  Filled 2013-11-03 (×3): qty 5

## 2013-11-03 MED ORDER — DEXTROSE-NACL 5-0.9 % IV SOLN
INTRAVENOUS | Status: DC
  Administered 2013-11-03: 21:00:00 via INTRAVENOUS

## 2013-11-03 MED ORDER — MAGNESIUM SULFATE 40 MG/ML IJ SOLN
2.0000 g | Freq: Once | INTRAMUSCULAR | Status: AC
  Administered 2013-11-03: 2 g via INTRAVENOUS
  Filled 2013-11-03: qty 50

## 2013-11-03 MED ORDER — SODIUM CHLORIDE 0.9 % IV BOLUS (SEPSIS)
1000.0000 mL | Freq: Once | INTRAVENOUS | Status: AC
  Administered 2013-11-03: 1000 mL via INTRAVENOUS

## 2013-11-03 MED ORDER — SODIUM CHLORIDE 0.9 % IV SOLN
600.0000 mg | Freq: Once | INTRAVENOUS | Status: AC
  Administered 2013-11-03: 600 mg via INTRAVENOUS
  Filled 2013-11-03: qty 6

## 2013-11-03 MED ORDER — METOPROLOL TARTRATE 1 MG/ML IV SOLN
2.5000 mg | Freq: Once | INTRAVENOUS | Status: AC
  Administered 2013-11-03: 2.5 mg via INTRAVENOUS

## 2013-11-03 NOTE — Evaluation (Signed)
SLP Cancellation Note  Patient Details Name: [name] MRN: [id number] DOB: Sep 26, 1955   Cancelled treatment:       Reason Eval/Treat Not Completed: Medical issues which prohibited therapy (pt just extubated at 1115, will reattempt eval at later time)   Mills KollerKimball, Destiny Trickey Ann Wataru Mccowen, MS [facility] SLP [phone]

## 2013-11-03 NOTE — Procedures (Signed)
Extubation Procedure Note  Patient Details:   Name: [name] DOB: 12/21/1955 MRN: [id number]   Airway Documentation:     Evaluation  O2 sats: stable throughout Complications: No apparent complications Patient did tolerate procedure well. Bilateral Breath Sounds: Clear;Diminished Suctioning: Airway Yes Patient extubated to 4lnc. No complications. Vital signs stable. RN at bedside. RT will continue to monitor.  Ave Filterdkins, Elizebath Wever Williams 11/03/2013, 11:17 AM

## 2013-11-03 NOTE — Progress Notes (Signed)
eLink Physician-Brief Progress Note Patient Name: [name] MilchDavid C [name] DOB: [birth year] MRN: [id number]  Date of Service  11/03/2013   HPI/Events of Note   Fever.  NPO, and has elevated LFT's.  eICU Interventions  Will give IV ibuprofen x one.   Intervention Category Major Interventions: Other:  Sharea Guinther 11/03/2013, 4:30 PM

## 2013-11-03 NOTE — Progress Notes (Signed)
[facility] FOR AM LAB REPLACEMENT ONLY  The patient does [facility] the [facility] Electrolyte [facility] Protocol based on the criteria listed below:   1. Is GFR >/= 40 ml/min? Yes.    Patient's GFR today is 70 2. Is urine output >/= 0.5 ml/kg/hr for the last 6 hours? Yes.   Patient's UOP is 0.53 ml/kg/hr 3. Is BUN < 60 mg/dL? Yes.    Patient's BUN today is 15 4. Abnormal electrolyte(s): K 2.8, Mg 1.7 5. Ordered repletion with: per protocol 6. If a panic level lab has been reported, has the CCM MD in charge been notified? Yes.  .   Physician:  Dr Curlene Labrumsimonds  Andrea Colglazier A 11/03/2013 5:08 AM

## 2013-11-03 NOTE — Progress Notes (Signed)
ANTIBIOTIC CONSULT NOTE - FOLLOW UP  Pharmacy Consult for vancomycin / Zosyn Indication: sepsis  Labs:  Recent Labs  11/01/13 0415 11/01/13 0643  11/02/13 0400  11/03/13 0340 11/03/13 1100 11/03/13 2020  WBC 10.4  --   --  8.1  --  6.4  --   --   HGB 10.7*  --   --  9.9*  --  9.5*  --   --   PLT 107*  --   --  107*  --  116*  --   --   LABCREA  --  185.4  --   --   --   --   --   --   CREATININE 1.78*  --   < > 1.31  < > 1.13 1.21 1.56*  < > = values in this interval not displayed. Estimated Creatinine Clearance: 65.5 ml/min (by C-G formula based on Cr of 1.56).  Recent Labs  11/03/13 2020  VANCOTROUGH 13.4     Assessment: 58 y/o M on empiric vancomycin and Zosyn for r/o sepsis, pharmacy dosing assistance requested.  AKI present with elevated SCr.   SCr increased to 1.56, CrCl ~ 65 ml/min CG, ~ 53 ml/min Normalized  Vancomycin trough level = 13.4.  Slightly below goal level, but expect accumulation with rising SCr.  Recheck trough level as needed if vancomycin therapy continues.  Goal of Therapy:  Appropriate antibiotic dosing for renal function; eradication of infection. Vancomycin trough 15-20  Plan:   Continue vancomycin 1 gram IV q12h  Follow serum creatinine, cultures, clinical course.  Elie Goodyandy Absher, PharmD, BCPS Pager: [phone] 11/03/2013  9:50 PM

## 2013-11-03 NOTE — Progress Notes (Signed)
eLink Physician-Brief Progress Note Patient Name: [name] MilchDavid C Mccorkle DOB: 02-14-1956 MRN: [id number]  Date of Service  11/03/2013   HPI/Events of Note   Relatively low blood sugar.  Not getting enteral nutrition.  eICU Interventions  Will add dextrose to IV fluid   Intervention Category Major Interventions: Other:  Tamee Battin 11/03/2013, 8:51 PM

## 2013-11-03 NOTE — Progress Notes (Signed)
CRITICAL VALUE ALERT  Critical value received:  Cat++ 6.3  Date of notification:  11/03/2013   Time of notification:  2120  Critical value read back:Yes.    Nurse who received alert:  Everette RankJennifer Kura Bethards, RN  MD notified (1st page):  Dr. Craige CottaSood  Time of first page:  2124  MD notified (2nd page):  Time of second page:  Responding MD:  Dr. Craige CottaSood  Time MD responded:  2124  MD aware.

## 2013-11-03 NOTE — Progress Notes (Signed)
PT Cancellation Note  Patient Details Name: [name] MilchDavid C [name] MRN: [id number] DOB: [birth year]   Cancelled Treatment:    Reason Eval/Treat Not Completed: Medical issues which prohibited therapy. Continues to be on vent.   Rada HayHill, Haider Hornaday Elizabeth 11/03/2013, 7:12 AM Blanchard KelchKaren Jesaiah Fabiano PT [phone]

## 2013-11-03 NOTE — Progress Notes (Addendum)
CRITICAL VALUE ALERT  Critical value received:  Calcium 6.4  Date of notification:  11/03/2013  Time of notification:  1113 am  Critical value read back:  yes  Nurse who received alert:  Roney JaffeAngela Magnolia Mattila, RN  MD notified (1st page):  Dr. Kendrick FriesMcQuaid  Time of first page:  1235  MD notified (2nd page):  Time of second page:  Responding MD:  Dr. Kendrick FriesMcQuaid  Time MD responded:  [phone]

## 2013-11-03 NOTE — Progress Notes (Signed)
PULMONARY / CRITICAL CARE MEDICINE   Name: [name] MRN: [id number] DOB: Aug 29, 1955    ADMISSION DATE:  10/30/2013 CONSULTATION DATE: 11/01/13  REFERRING MD : Dr. Blake Divine  PRIMARY SERVICE: TRH  CHIEF COMPLAINT: ETOH Withdrawal    BRIEF PATIENT DESCRIPTION: 58 y.o. male with prior h/o hypertension, DM, alcohol abuse, who presented [facility] 6/27 for ETOH detox.  Found to have hyponatremia & metabolic acidosis. Admitted to the medical floor and decompensated with tachycardia, AMS, respiratory failure.  Tx to ICU 6/29 requiring intubation / TLC, & precedex.     SIGNIFICANT EVENTS:  6/27 - admitted for ETOH detox 6/29 - tachycardia (160 bpm) and irregular respiratory pattern and increased work of breathing, PCCM to see.  6/30 - Hematuria, continues to need pressors 7/01 - Off pressors, more alert/oriented, heart rate elevated   STUDIES:  6/29 - Abd U/S:  Hepatic steatosis consistent with hepatic cirrhosis, portal vein with hepatopetal flow 6/30 - TTE > LVEF 60%, mild concentric LVH, normal valves and RV  LINES / TUBES:  OETT 6/29 >>> R IJ 6/29 >>> R radial arterial line >>>  CULTURES:  6/27 Staph aureus PCR >> positive (neg MRSA)  6/29 Blood cultures >>> 6/30 Stool culture >>> 7/01 C-diff >>>   ANTIBIOTICS:  Vancomycin 6/29 >>>  Zosyn 6/29 >>>    SUBJECTIVE/OVERNIGHT: Overnight the hematuria resolved and he spiked a low-grade fever (100.47f). Patient is off pressors and sedation this AM on the ventilator. Patient seems calm and alert/oriented, oxygenating well on minimal settings, however heart rate is 125 bpm. Tentative plan to extubate this morning.   VITAL SIGNS: Temp:  [98.2 F (36.8 C)-100.6 F (38.1 C)] 98.2 F (36.8 C) (07/01 0400) Pulse Rate:  [85-119] 113 (07/01 0500) Resp:  [0-24] 20 (07/01 0500) BP: (94-102)/(51-61) 96/56 mmHg (07/01 0346) SpO2:  [96 %-98 %] 96 % (07/01 0500) FiO2 (%):  [30 %] 30 % (07/01 0346)   VENTILATOR SETTINGS: Vent Mode:  [-] PRVC FiO2  (%):  [30 %] 30 % Set Rate:  [20 bmp] 20 bmp Vt Set:  [600 mL] 600 mL PEEP:  [5 cmH20] 5 cmH20 Pressure Support:  [5 cmH20] 5 cmH20 Plateau Pressure:  [19 cmH20-22 cmH20] 20 cmH20  INTAKE / OUTPUT: Intake/Output     06/30 0701 - 07/01 0700 07/01 0701 - 07/02 0700   I.V. (mL/kg) 6228.5 (57.2)    Other 345    NG/GT 1140    IV Piggyback 1562    Total Intake(mL/kg) 9275.5 (85.2)    Urine (mL/kg/hr) 345 (0.1)    Total Output 345     Net +8930.5           PHYSICAL EXAMINATION: General: chronically ill appearing male in NAD Neuro: Intubated, alert, follows simple commands, nods appropriately to some questions, calm demeanor.  HEENT: PERRL, R IJ in place, OETT Cardiovascular: sinus tachycardia, no murmurs Lungs: CTA  Abdomen: soft, non-distended, non-tender GI/GU: amber colored urine, mild amount of diarrhea - flexi-seal  Musculoskeletal: MAEs  Skin: LE edema  LABS:  CBC  Recent Labs Lab 11/01/13 0415 11/02/13 0400 11/03/13 0340  WBC 10.4 8.1 6.4  HGB 10.7* 9.9* 9.5*  HCT 31.0* 28.9* 27.8*  PLT 107* 107* 116*   Coag's No results found for this basename: APTT, INR,  in the last 168 hours BMET  Recent Labs Lab 11/02/13 0400 11/02/13 1400 11/02/13 2055 11/03/13 0340  NA 126*  --  136* 136*  K 2.8* 2.9* 2.9* 2.8*  CL 84*  --  94* 94*  CO2 13*  --  22 25  BUN 16  --  16 15  CREATININE 1.31  --  1.21 1.13  GLUCOSE 170*  --  183* 166*   Electrolytes  Recent Labs Lab 11/01/13 0642  11/02/13 0400 11/02/13 1400 11/02/13 2055 11/03/13 0340  CALCIUM  --   < > 6.9*  --  6.4* 6.6*  MG 1.6  --  2.0  --   --  1.6  PHOS  --   < > 1.0* 1.4* 1.7* 1.7*  < > = values in this interval not displayed. Sepsis Markers  Recent Labs Lab 11/01/13 0642 11/01/13 1115  LATICACIDVEN 2.0 1.7   ABG  Recent Labs Lab 11/02/13 0355 11/02/13 1018 11/03/13 0340  PHART 7.382 7.379 7.427  PCO2ART 22.2* 30.2* 40.3  PO2ART 129.0* 111.0* 108.0*   Liver Enzymes  Recent  Labs Lab 11/01/13 1115 11/02/13 0400 11/03/13 0340  AST 177* 111* 127*  ALT 215* 157* 133*  ALKPHOS 152* 144* 149*  BILITOT 4.5* 4.1* 4.0*  ALBUMIN 2.7* 2.3* 2.1*   Cardiac Enzymes  Recent Labs Lab 11/01/13 1321  TROPONINI <0.30   Glucose  Recent Labs Lab 11/02/13 0752 11/02/13 1154 11/02/13 1543 11/02/13 1925 11/02/13 2320 11/03/13 0320  GLUCAP 154* 176* 176* 212* 171* 166*    Imaging Dg Abd 1 View  11/01/2013   CLINICAL DATA:  Orogastric tube placement.  EXAM: ABDOMEN - 1 VIEW  COMPARISON:  None.  FINDINGS: Orogastric tube extends into the esophagus with the tip located in the chest at the juncture of the mid and distal esophagus. Vague soft tissue density is seen in the abdomen with no evidence of bowel obstruction or significant ileus. There may be underlying ascites.  IMPRESSION: Orogastric tube tip lies in the esophagus at the juncture of the mid and distal thirds. Possible ascites.   Electronically Signed   By: Irish LackGlenn  Yamagata M.D.   On: 11/01/2013 17:23   [place] Abdomen Complete  11/01/2013   CLINICAL DATA:  Critically ill intubated patient in the ICU with elevated LFTs. Prior history of alcoholic hepatitis.  EXAM: ULTRASOUND ABDOMEN COMPLETE  COMPARISON:  03/02/2008.  FINDINGS: Gallbladder:  No shadowing gallstones or echogenic sludge. No gallbladder wall thickening or pericholecystic fluid. Negative sonographic Murphy sign according to the ultrasound technologist.  Common bile duct:  Diameter: Approximately 8 mm centrally.  Liver:  Diffusely increased and coarsened echotexture without focal hepatic parenchymal abnormality. Relatively enlarged left lobe relative to the right lobe, with subtle irregularity in the hepatic contour. Patent portal vein with hepatopetal flow.  IVC:  Patent in its intrahepatic portion. Not visualized outside of the liver.  Pancreas:  Normal-appearing head and body. Tail obscured by overlying bowel gas.  Spleen:  Normal size and echotexture without  focal parenchymal abnormality.  Right Kidney:  Length: Approximately 11.0 cm. No hydronephrosis. Well-preserved cortex. No shadowing calculi. Normal parenchymal echotexture without focal abnormalities.  Left Kidney:  Length: Approximately 11.0 cm. No hydronephrosis. Well-preserved cortex. No shadowing calculi. Normal parenchymal echotexture without focal abnormalities.  Abdominal aorta:  Normal in caliber throughout its visualized course in the abdomen with evidence of atherosclerosis. Obscured distally by overlying bowel gas. Maximum diameter 2.3 cm.  Other findings:  None.  IMPRESSION: 1. Diffuse hepatic steatosis without focal hepatic parenchymal abnormality. Sonographic findings consistent with hepatic cirrhosis. 2. Patent portal vein with hepatopetal flow. 3. Otherwise normal examination with a caveat that the extrahepatic IVC, the pancreatic tail, and the distal abdominal aorta were  obscured by bowel gas or therefore not evaluated.   Electronically Signed   By: Hulan Saashomas  Lawrence M.D.   On: 11/01/2013 18:28   Dg Chest Port 1 View  11/02/2013   CLINICAL DATA:  Endotracheal tube placement  EXAM: PORTABLE CHEST - 1 VIEW  COMPARISON:  11/01/2013  FINDINGS: Endotracheal tube is in good position. Central venous catheter tip at the lower SVC. NG tube enters the stomach.  Bibasilar atelectasis unchanged.  Negative for edema or effusion.  IMPRESSION: Support lines in good position. Mild bibasilar atelectasis is unchanged.   Electronically Signed   By: Marlan Palauharles  Clark M.D.   On: 11/02/2013 07:18   Dg Chest Port 1 View  11/01/2013   CLINICAL DATA:  Line placement and endotracheal tube placement  EXAM: PORTABLE CHEST - 1 VIEW  COMPARISON:  October 31, 2013  FINDINGS: The heart size and mediastinal contours are stable. Endotracheal tube is identified with distal tip 2.1 cm from carina. Retraction by 2 cm is recommended. A right jugular central venous line is identified with distal tip in the superior vena cava. There is  mild central pulmonary vascular congestion. There is no pneumothorax. There is no focal pneumonia. The visualized skeletal structures are stable.  IMPRESSION: Endotracheal tube identified with distal tip 2.1 cm from carina. Retraction by 2 cm is recommended. There is no pneumothorax. Right jugular central venous line in good position.   Electronically Signed   By: Sherian ReinWei-Chen  Lin M.D.   On: 11/01/2013 15:24    ASSESSMENT / PLAN:  NEUROLOGIC  A:  Acute encephalopathy related to ETOH Withdrawal / Liver disease Depression - father passed away 2 mo pta, drinking has increased since.   P:  Change fentanyl to PRN Hold wellbutrin, prozac, ambien until taking PO Ct Ativan PRN  Monitor for hepatic encephalopathy, but for now continue prn benzo given high EtOH use  GASTROINTESTINAL  A:  Elevated Liver enzymes - h/o etoh abuse - trending down 7/1 Cirrhosis - noted on abd U/S 6/29. Ammonia 6/30:  39 Diarrhea - chronic > etoh/cirrhosis related?, acute> refeeding? worse overnight, sent cultures 6/30 P:  Stop Bicarb gtt  Continue Tube feeds Check c.diff  HEMATOLOGIC  A:  Anemia - related to GI losses Thrombocytopenia - improving P:  Transfuse <7  Ct Lovenox  PULMONARY  A:  Acute Respiratory Failure - secondary to ETOH withdrawal (inability to protect airway)  P:  Wean to extubate Daily SBT / WUA F/u ABG this afternoon and in AM CXR PRN  CARDIOVASCULAR  A:  Tachycardia - related to decreased sedation Hypotension - resolved, off levophed Troponin neg  H/o RBBB  Sinus tach - due to etoh or b-blocker withdrawal? P: Scheduled metoprolol Goal MAP >65  RENAL  A:  Metabolic Acidosis with co-comitant Metabolic Alkalosis - elevated anion gap (22) secondary to excessive GI losses and starvation > improved Hypokalemia Hyponatremia SIADH like syndrome > improving Hematuria - resolved, concern for catheter related trauma in setting of thrombocytopenia  Urine Os 6/30: 402 Urine Na 6/30:  <20 Urine Creat 6/30: 185.4 P:  Decrease IVF rate Replace electrolytes as indicated Discontinue Bicarb gtt F/u daily BMPs at 1100  F/u CMP, Mag, and phos in AM  INFECTIOUS  A:  R/O infectious etiology of decompensation Low grade fevers 6/30 (100.2855f) > due to EtOH withdrawal P:  Started empiric Vanc/Zosyn 6/29 - change to cipro/flagyl d/t possible GI source?  F/u Blood cultures  Assess stool culture and c-diff   ENDOCRINE  A:  Hyperglycemia -  related to Tube feeds and D5 TSH 6/29: 1.290  Cortisol 6/29: 23.6  Hgb A1C 6/28: 5.8  P:  CBG monitoring   Summary: Patient off levophed and sedation this AM, with alert and calm demeanor. Doing well on pressure support, will plan for extubation today. Heart rate increased this morning and febrile overnight - plan to continue antibiotics, possibly change to cipro/flagyl for GI source - WBC remain stable. May give low dose metoprolol for tachycardia. Bicarb gtt discontinued, improved metabolic acidosis with decreasing anion gap, monitor delta gap as diarrhea source of non-anion gap metabolic acidosis.   Elsie Amis, ACNP Student    Attending:  I have seen and examined the patient with nurse practitioner/resident and agree with and have edited the note above.   Hopeful extubation today  CC time today 35 minutes  Heber Fairview, MD [place] [facility] Pager: [phone] Cell: [phone] If no response, call [phone]     11/03/2013, 7:29 AM

## 2013-11-03 NOTE — Evaluation (Signed)
Clinical/Bedside Swallow Evaluation Patient Details  Name: [name] MilchDavid C Hodgkins MRN: [id number] Date of Birth: Oct 01, 1955  Today's Date: 11/03/2013 Time: [id number] SLP Time Calculation (min): 18 min  Past Medical History:  Past Medical History  Diagnosis Date  . Hypertension   . Alcohol abuse     Drinks 1-2 large bottle wine per day  . Depression    Past Surgical History:  Past Surgical History  Procedure Laterality Date  . Back surgery    . Knnes time 2    . Minor nailbed repair Right 11/03/2012    Procedure: NAIL DEBRIDEMENT AND BIOPSY RIGHT THUMB NAIL;  Surgeon: Wyn Forsterobert V Sypher Jr., MD;  Location: [facility];  Service: Orthopedics;  Laterality: Right;   HPI:  58 [facility] and he had respiratory distress, metabolic acidosis and hyponatremia requiring intubation 6/29-11/03/13.  Swallow evaluation ordered today, extubated at 1115 today with good tolerance.    Assessment / Plan / Recommendation Clinical Impression  Pt reported he was hungry and desired to participate in swallow evaluation.  No focal CN deficits apparent, pt is hoarse and had decreased phonatory strength.  He was able to follow commands with repetition and delays.    SLP provided pt with ice chips, tsp thin and 2 small bites of applesauce as pt did not attempt to help hold cup, spoon.  Delayed oral transit noted with multiple swallows and delayed throat clearing after swallow.  SLP ?s if pt may be some residual pharyngeal edema from intubation negatively impacting airway protection with po intake.    Pt became sleepy during testing and required maximum verbal/tactile stimulation to maintain LOA for longer than 5 seconds.  Due his mental status and concern for possible pharyngeal/laryngeal edema, recommend NPO and reevaluate pt next date.  Hopeful for quick resolution of dysphagia as possible pharyngeal edema subsides.  Pt and RN informed.      Aspiration Risk  Severe    Diet Recommendation NPO    Medication Administration: Via alternative means    Other  Recommendations Oral Care Recommendations: Oral care Q4 per protocol   Follow Up Recommendations   (tbd)    Frequency and Duration min 2x/week  1 week   Pertinent Vitals/Pain Low grade temp, decreased, weak cough     Swallow Study Prior Functional Status   pt did not answer question re: possible premorbid dysphagia    General Date of Onset: 11/03/13 HPI: 58 [facility] male [facility] and he had respiratory distress, metabolic acidosis and hyponatremia requiring intubation 6/29-11/03/13.  Swallow evaluation ordered today, extubated at 1115 today with good tolerance.  Type of Study: Bedside swallow evaluation Diet Prior to this Study: NPO Temperature Spikes Noted: Yes (low grade fever 101.5) Respiratory Status: Nasal cannula History of Recent Intubation: Yes Length of Intubations (days): 3 days Date extubated: 11/03/13 Behavior/Cognition: Lethargic;Decreased sustained attention;Doesn't follow directions Oral Cavity - Dentition: Adequate natural dentition Self-Feeding Abilities: Able to feed self Patient Positioning: Upright in bed Baseline Vocal Quality: Hoarse;Low vocal intensity Volitional Cough: Weak Volitional Swallow: Able to elicit    Oral/Motor/Sensory Function Overall Oral Motor/Sensory Function:  (no focal deficits, gross weakness noted)   Ice Chips Ice chips: Impaired Presentation: Spoon Oral Phase Impairments: Reduced lingual movement/coordination;Impaired anterior to posterior transit Oral Phase Functional Implications: Prolonged oral transit Pharyngeal Phase Impairments: Throat Clearing - Immediate   Thin Liquid Thin Liquid: Impaired Presentation: Spoon Oral Phase Impairments: Reduced lingual movement/coordination;Impaired anterior to posterior transit Oral Phase  Functional Implications: Prolonged oral transit Pharyngeal  Phase Impairments: Multiple swallows    Nectar Thick Nectar Thick Liquid: Not  tested   Honey Thick Honey Thick Liquid: Not tested   Puree Puree: Impaired Presentation: Spoon Oral Phase Impairments: Reduced lingual movement/coordination;Impaired anterior to posterior transit Oral Phase Functional Implications: Prolonged oral transit Pharyngeal Phase Impairments: Suspected delayed Swallow;Multiple swallows   Solid   GO    Solid: Not tested       Donavan Burnetamara Nhia Heaphy, MS [facility] SLP [phone]

## 2013-11-03 NOTE — Plan of Care (Signed)
Problem: Phase II Progression Outcomes Goal: Date pt extubated/weaned off vent Outcome: Completed/Met Date Met:  11/03/13 11/03/2013 Goal: Time pt extubated/weaned off vent Outcome: Completed/Met Date Met:  11/03/13 1113 am Goal: Hemodynamically stable Outcome: Progressing Hr 125-135 Goal: Progress activities as ordered Outcome: Progressing Up in chair     

## 2013-11-03 NOTE — Progress Notes (Signed)
OT Cancellation Note  Patient Details Name: [name] MRN: [id number] DOB: 1956/03/03   Cancelled Treatment:    Reason Eval/Treat Not Completed: Other (comment)  Prt still on vent.  Will likely check back tomorrow  Kareen Jefferys 11/03/2013, 7:23 AM Marica OtterMaryellen Ajene Carchi, OTR/L [phone] 11/03/2013

## 2013-11-03 NOTE — Progress Notes (Signed)
ANTIBIOTIC CONSULT NOTE - FOLLOW UP  Pharmacy Consult for vancomycin / Zosyn Indication: sepsis  No Known Allergies  Patient Measurements: Height: 5\' 11"  (180.3 cm) Weight: 240 lb 1.3 oz (108.9 kg) IBW/kg (Calculated) : 75.3   Vital Signs: Temp: 101.5 F (38.6 C) (07/01 1200) Temp src: Oral (07/01 1200) BP: 99/52 mmHg (07/01 1200) Pulse Rate: 123 (07/01 1200) Intake/Output from previous day: 06/30 0701 - 07/01 0700 In: 9905.5 [I.V.:6678.5; NG/GT:1220; IV Piggyback:1662] Out: 380 [Urine:380] Intake/Output from this shift: Total I/O In: 150 [I.V.:150] Out: 60 [Urine:60]  Labs:  Recent Labs  11/01/13 0415 11/01/13 0643  11/02/13 0400 11/02/13 2055 11/03/13 0340 11/03/13 1100  WBC 10.4  --   --  8.1  --  6.4  --   HGB 10.7*  --   --  9.9*  --  9.5*  --   PLT 107*  --   --  107*  --  116*  --   LABCREA  --  185.4  --   --   --   --   --   CREATININE 1.78*  --   < > 1.31 1.21 1.13 1.21  < > = values in this interval not displayed. Estimated Creatinine Clearance: 84.5 ml/min (by C-G formula based on Cr of 1.21). No results found for this basename: VANCOTROUGH, Leodis BinetVANCOPEAK, VANCORANDOM, GENTTROUGH, GENTPEAK, GENTRANDOM, TOBRATROUGH, TOBRAPEAK, TOBRARND, AMIKACINPEAK, AMIKACINTROU, AMIKACIN,  in the last 72 hours   Microbiology: Recent Results (from the past 720 hour(s))  MRSA PCR SCREENING     Status: None   Collection Time    11/01/13  8:31 AM      Result Value Ref Range Status   MRSA by PCR NEGATIVE  NEGATIVE Final   Comment:            The GeneXpert MRSA Assay (FDA     approved for NASAL specimens     only), is one component of a     comprehensive MRSA colonization     surveillance program. It is not     intended to diagnose MRSA     infection nor to guide or     monitor treatment for     MRSA infections.  CULTURE, BLOOD (ROUTINE X 2)     Status: None   Collection Time    11/01/13  1:49 PM      Result Value Ref Range Status   Specimen Description BLOOD  CENTRAL LINE   Final   Special Requests BOTTLES DRAWN AEROBIC AND ANAEROBIC [id number]   Final   Culture  Setup Time     Final   Value: 11/01/2013 22:54     Performed at [facility]   Culture     Final   Value:        BLOOD CULTURE RECEIVED NO GROWTH TO DATE CULTURE WILL BE HELD FOR 5 DAYS BEFORE ISSUING A FINAL NEGATIVE REPORT     Performed at [facility]   Report Status PENDING   Incomplete  CULTURE, BLOOD (ROUTINE X 2)     Status: None   Collection Time    11/01/13  4:34 PM      Result Value Ref Range Status   Specimen Description BLOOD RIGHT ARM   Final   Special Requests BOTTLES DRAWN AEROBIC AND ANAEROBIC 10CC   Final   Culture  Setup Time     Final   Value: 11/01/2013 22:51     [facility] at [facility]   Culture  Final   Value:        BLOOD CULTURE RECEIVED NO GROWTH TO DATE CULTURE WILL BE HELD FOR 5 DAYS BEFORE ISSUING A FINAL NEGATIVE REPORT     Performed at [facility]   Report Status PENDING   Incomplete  STOOL CULTUAdvanced Micro DevicesE     Status: None   Collection Time    11/02/13  8:52 PM      Result Value Ref Range Status   Specimen Description STOOL   Final   Special Requests NONE   Final   Culture     Final   Value: Culture reincubated for better growth     Performed at [facility]   Report Status PENDING   Incomplete  CLOSTRIDIUM DIFFICILE BY PCR     Status: None   Collection Time    11/03/13  3:53 AM      Result Value Ref Range Status   C difficile by pcr NEGATIVE  NEGATIVE Final   Comment: [facility]    Anti-infectives   Start     Dose/Rate Route Frequency Ordered Stop   11/02/13 1600  vancomycin (VANCOCIN) 1,500 mg in sodium chloride 0.9 % 500 mL IVPB  Status:  Discontinued     1,500 mg 250 mL/hr over 120 Minutes Intravenous Every 24 hours 11/01/13 1349 11/02/13 0542   11/02/13 1000  vancomycin (VANCOCIN) IVPB 1000 mg/200 mL premix  Status:  Discontinued     1,000 mg 200 mL/hr over 60 Minutes Intravenous  Every 12 hours 11/02/13 0542 11/02/13 0851   11/02/13 1000  vancomycin (VANCOCIN) 1,000 mg in sodium chloride 0.9 % 250 mL IVPB     1,000 mg 250 mL/hr over 60 Minutes Intravenous Every 12 hours 11/02/13 0851     11/01/13 1500  vancomycin (VANCOCIN) 2,000 mg in sodium chloride 0.9 % 500 mL IVPB     2,000 mg 250 mL/hr over 120 Minutes Intravenous  Once 11/01/13 1348 11/01/13 1810   11/01/13 1400  piperacillin-tazobactam (ZOSYN) IVPB 3.375 g     3.375 g 12.5 mL/hr over 240 Minutes Intravenous 3 times per day 11/01/13 1343        Assessment: 58 y/o M admitted for EtOH detoxification and decompensated with hypotension tachycardia, respiratory failure, leukocytosis; lactate was WNL.  Empiric vancomycin and Zosyn started for r/o sepsis, pharmacy dosing assistance requested.  AKI present with elevated SCr.  7/1:  D#3 vancomycin 2 grams x 1, then 1 gram q12h / Zosyn 3.375 grams IV q8h (extended-infusion) for r/o sepsis  Azotemia resolved  Leukocytosis resolved  Blood cultures no growth to date  Interim development of diarrhea noted; stool negative for C.diff, stool culture pending.    Goal of Therapy:  Appropriate antibiotic dosing for renal function; eradication of infection. Vancomycin trough 15-20   Plan:  1. Continue Zosyn 3.375 grams IV q8h (extended-infusion, each dose over 4 hours), 2. Continue vancomycin 1 gram IV q12h, 3. Check vancomycin trough prior to tonight's 10 pm dose, adjust dosage as necessary. 4. Follow serum creatinine, cultures, clinical course.  Elie Goodyandy Aubree Doody, PharmD, BCPS Pager: [phone] 11/03/2013  1:08 PM

## 2013-11-03 NOTE — Progress Notes (Signed)
eLink Physician-Brief Progress Note Patient Name: [name] MilchDavid C [name] DOB: [birth year] MRN: [id number]  Date of Service  11/03/2013   HPI/Events of Note   oliguria  eICU Interventions  Will give fluid bolus   Intervention Category Major Interventions: Other:  Joey Hudock 11/03/2013, 6:41 PM

## 2013-11-03 NOTE — Progress Notes (Signed)
NUTRITION FOLLOW UP  Intervention:   - Diet advancement per MD/SLP - Recommend continued monitoring and replacement of potassium and phopshorus - RD to monitor plan of care   Nutrition Dx:   Inadequate oral intake related to inability to eat as evidenced by NPO - ongoing   Goal:   Enteral nutrition to provide 60-70% of estimated calorie needs (22-25 kcals/kg ideal body weight) and 100% of estimated protein needs, based on ASPEN guidelines for permissive underfeeding in critically ill obese individuals - not met, TF d/c  New goal: Advance diet as tolerated to regular diet   Monitor:   Weights, labs, diet advancement   Assessment:   Pt with prior h/o hypertension, dm, alcohol abuse, came in to the hospital for alcohol detox. Patient appears confused and most of the history was available from the patients wife. As per the wife, pt has a h/o alcohol abuse for more than 30 years, pt's father died 2 months ago and since then patient has been depressed and drinking more.   6/29: Experienced increased tachypnea, lethargy, with elevated heart rate this afternoon with pt being as responsive as previously per RN notes. Intubated today for airway protection and maintenance. Alone in room on vent. Started TF via OGT of Vital High Protein at 36m/hr increase by 15mevery 12 hours to goal of 6088mr with Prostat 26m65mD. Goal rate will provide 1640 calories, 156g protein, and 1204ml63me water that will meet 72% estimated calorie needs, 100% estimated protein needs from verbal order from MD for RD to manage TF. Advanced slowly due to refeeding risk   6/30: Remains on vent, alone in room. Per RN, pt without any TF residuals. TF increased to 26ml 81m morning of Vital High Protein. Potassium and phosphorus low and are being replaced.  7/1: Extubated and TF d/c today. Alone in room. Slow to respond to questions but did report eating well PTA with stable weight. Potassium and phosphorus low but  improving, getting replaced.    Potassium  Date/Time Value Ref Range Status  11/03/2013 11:00 AM 3.6* 3.7 - 5.3 mEq/L Final     REPEATED TO VERIFY     DELTA CHECK NOTED     NO VISIBLE HEMOLYSIS  11/03/2013  3:40 AM 2.8* 3.7 - 5.3 mEq/L Final     CRITICAL RESULT CALLED TO, READ BACK BY AND VERIFIED WITH:     BANKS,T RN [id number] 07.01.15 BY TIBBITTS,K  11/02/2013  8:55 PM 2.9* 3.7 - 5.3 mEq/L Final     DELTA CHECK NOTED     NO VISIBLE HEMOLYSIS     CRITICAL RESULT CALLED TO, READ BACK BY AND VERIFIED WITH:     T BANKS RN 2209 11/02/13 A NAVARRO    Phosphorus  Date/Time Value Ref Range Status  11/03/2013  3:40 AM 1.7* 2.3 - 4.6 mg/dL Final  11/02/2013  8:55 PM 1.7* 2.3 - 4.6 mg/dL Final  11/02/2013  2:00 PM 1.4* 2.3 - 4.6 mg/dL Final    Magnesium  Date/Time Value Ref Range Status  11/03/2013  3:40 AM 1.6  1.5 - 2.5 mg/dL Final  11/02/2013  4:00 AM 2.0  1.5 - 2.5 mg/dL Final  11/01/2013  6:42 AM 1.6  1.5 - 2.5 mg/dL Final        Height: Ht Readings from Last 1 Encounters:  11/01/13 _0  (1.803 m)    Weight Status:   Wt Readings from Last 1 Encounters:  10/31/13 240 lb 1.3 oz (108.9 kg)  Re-estimated needs:  Kcal: 1950-2150 Protein: 95-115g Fluid: 1.9-2.1L/day   Skin: Intact   Diet Order: NPO   Intake/Output Summary (Last 24 hours) at 11/03/13 1340 Last data filed at 11/03/13 1300  Gross per 24 hour  Intake   8027 ml  Output    440 ml  Net   7587 ml    Last BM: 7/1   Labs:   Recent Labs Lab 11/01/13 0642  11/02/13 0400 11/02/13 1400 11/02/13 2055 11/03/13 0340 11/03/13 1100  NA  --   < > 126*  --  136* 136* 136*  K  --   < > 2.8* 2.9* 2.9* 2.8* 3.6*  CL  --   < > 84*  --  94* 94* 95*  CO2  --   < > 13*  --  _0 BUN  --   < > 16  --  _1 CREATININE  --   < > 1.31  --  1.21 1.13 1.21  CALCIUM  --   < > 6.9*  --  6.4* 6.6* 6.4*  MG 1.6  --  2.0  --   --  1.6  --   PHOS  --   < > 1.0* 1.4* 1.7* 1.7*  --   GLUCOSE  --   < > 170*  --  183*  166* 111*  < > = values in this interval not displayed.  CBG (last 3)   Recent Labs  11/03/13 0320 11/03/13 0750 11/03/13 1141  GLUCAP 166* 165* 114*    Scheduled Meds: . antiseptic oral rinse  15 mL Mouth Rinse QID  . chlorhexidine  15 mL Mouth Rinse BID  . enoxaparin (LOVENOX) injection  40 mg Subcutaneous Q1200  . insulin aspart  0-9 Units Subcutaneous 6 times per day  . metoprolol  2.5 mg Intravenous 4 times per day  . pantoprazole sodium  40 mg Per Tube Daily  . piperacillin-tazobactam (ZOSYN)  IV  3.375 g Intravenous 3 times per day  . thiamine  100 mg Oral Daily   Or  . thiamine  100 mg Intravenous Daily  . vancomycin (VANCOCIN) 1000 mg IVPB  1,000 mg Intravenous Q12H    Continuous Infusions: . sodium chloride 75 mL/hr (11/03/13 1029)  . norepinephrine (LEVOPHED) Adult infusion Stopped (11/02/13 1900)     Carlis Stable MS, RD, LDN [phone] Pager [phone] Weekend/After Hours Pager

## 2013-11-04 ENCOUNTER — Inpatient Hospital Stay (HOSPITAL_COMMUNITY): Payer: Medicare Other

## 2013-11-04 LAB — COMPREHENSIVE METABOLIC PANEL
ALT: 178 U/L — ABNORMAL HIGH (ref 0–53)
AST: 377 U/L — AB (ref 0–37)
Albumin: 1.9 g/dL — ABNORMAL LOW (ref 3.5–5.2)
Alkaline Phosphatase: 162 U/L — ABNORMAL HIGH (ref 39–117)
Anion gap: 17 — ABNORMAL HIGH (ref 5–15)
BILIRUBIN TOTAL: 4.4 mg/dL — AB (ref 0.3–1.2)
BUN: 17 mg/dL (ref 6–23)
CHLORIDE: 98 meq/L (ref 96–112)
CO2: 24 mEq/L (ref 19–32)
Calcium: 6.3 mg/dL — CL (ref 8.4–10.5)
Creatinine, Ser: 1.62 mg/dL — ABNORMAL HIGH (ref 0.50–1.35)
GFR calc Af Amer: 53 mL/min — ABNORMAL LOW (ref >90)
GFR calc non Af Amer: 46 mL/min — ABNORMAL LOW (ref >90)
Glucose, Bld: 92 mg/dL (ref 70–99)
Potassium: 3.6 mEq/L — ABNORMAL LOW (ref 3.7–5.3)
Sodium: 139 mEq/L (ref 137–147)
TOTAL PROTEIN: 4.9 g/dL — AB (ref 6.0–8.3)

## 2013-11-04 LAB — URINALYSIS, ROUTINE W REFLEX MICROSCOPIC
Glucose, UA: NEGATIVE mg/dL
Ketones, ur: 15 mg/dL — AB
NITRITE: POSITIVE — AB
PH: 5.5 (ref 5.0–8.0)
PROTEIN: 100 mg/dL — AB
Specific Gravity, Urine: 1.025 (ref 1.005–1.030)
Urobilinogen, UA: 1 mg/dL (ref 0.0–1.0)

## 2013-11-04 LAB — GLUCOSE, CAPILLARY
Glucose-Capillary: 69 mg/dL — ABNORMAL LOW (ref 70–99)
Glucose-Capillary: 77 mg/dL (ref 70–99)
Glucose-Capillary: 77 mg/dL (ref 70–99)
Glucose-Capillary: 89 mg/dL (ref 70–99)
Glucose-Capillary: 91 mg/dL (ref 70–99)
Glucose-Capillary: 93 mg/dL (ref 70–99)
Glucose-Capillary: 98 mg/dL (ref 70–99)

## 2013-11-04 LAB — URINE MICROSCOPIC-ADD ON

## 2013-11-04 LAB — MAGNESIUM: Magnesium: 1.8 mg/dL (ref 1.5–2.5)

## 2013-11-04 LAB — VANCOMYCIN, RANDOM: Vancomycin Rm: 18.2 ug/mL

## 2013-11-04 LAB — PHOSPHORUS: PHOSPHORUS: 2.1 mg/dL — AB (ref 2.3–4.6)

## 2013-11-04 MED ORDER — INSULIN ASPART 100 UNIT/ML ~~LOC~~ SOLN
0.0000 [IU] | Freq: Three times a day (TID) | SUBCUTANEOUS | Status: DC
  Administered 2013-11-07 – 2013-11-08 (×2): 1 [IU] via SUBCUTANEOUS
  Administered 2013-11-09: 2 [IU] via SUBCUTANEOUS
  Administered 2013-11-10: 1 [IU] via SUBCUTANEOUS

## 2013-11-04 MED ORDER — SODIUM CHLORIDE 0.9 % IV BOLUS (SEPSIS)
500.0000 mL | Freq: Once | INTRAVENOUS | Status: AC
  Administered 2013-11-04: 500 mL via INTRAVENOUS

## 2013-11-04 MED ORDER — FLUOXETINE HCL 20 MG PO CAPS
40.0000 mg | ORAL_CAPSULE | Freq: Every day | ORAL | Status: DC
  Administered 2013-11-04 – 2013-11-10 (×7): 40 mg via ORAL
  Filled 2013-11-04 (×7): qty 2

## 2013-11-04 MED ORDER — DEXTROSE-NACL 5-0.9 % IV SOLN: INTRAVENOUS | Status: DC

## 2013-11-04 MED ORDER — METOPROLOL TARTRATE 1 MG/ML IV SOLN: 2.5000 mg | Freq: Four times a day (QID) | INTRAVENOUS | Status: DC | PRN

## 2013-11-04 MED ORDER — BIOTENE DRY MOUTH MT LIQD
15.0000 mL | Freq: Two times a day (BID) | OROMUCOSAL | Status: DC
  Administered 2013-11-04 – 2013-11-05 (×3): 15 mL via OROMUCOSAL

## 2013-11-04 MED ORDER — VANCOMYCIN HCL 1000 MG IV SOLR: 1000.0000 mg | Freq: Two times a day (BID) | INTRAVENOUS | Status: DC

## 2013-11-04 MED ORDER — BUPROPION HCL ER (SR) 100 MG PO TB12
100.0000 mg | ORAL_TABLET | Freq: Every day | ORAL | Status: DC
  Administered 2013-11-04 – 2013-11-10 (×7): 100 mg via ORAL
  Filled 2013-11-04 (×7): qty 1

## 2013-11-04 MED ORDER — METOPROLOL TARTRATE 12.5 MG HALF TABLET
12.5000 mg | ORAL_TABLET | Freq: Two times a day (BID) | ORAL | Status: DC
  Administered 2013-11-04 (×2): 12.5 mg via ORAL
  Filled 2013-11-04 (×3): qty 1

## 2013-11-04 MED ORDER — SODIUM CHLORIDE 0.9 % IV SOLN
1.0000 g | Freq: Once | INTRAVENOUS | Status: AC
  Administered 2013-11-04: 1 g via INTRAVENOUS
  Filled 2013-11-04: qty 10

## 2013-11-04 MED ORDER — POTASSIUM PHOSPHATES 15 MMOLE/5ML IV SOLN
20.0000 meq | Freq: Once | INTRAVENOUS | Status: AC
  Administered 2013-11-04: 20 meq via INTRAVENOUS
  Filled 2013-11-04: qty 4.55

## 2013-11-04 MED ORDER — BUPROPION HCL 100 MG PO TABS: 100.0000 mg | ORAL_TABLET | Freq: Every day | ORAL | Status: DC

## 2013-11-04 MED ORDER — VANCOMYCIN HCL 10 G IV SOLR
1250.0000 mg | INTRAVENOUS | Status: DC
  Administered 2013-11-04 – 2013-11-06 (×3): 1250 mg via INTRAVENOUS
  Filled 2013-11-04 (×4): qty 1250

## 2013-11-04 NOTE — Progress Notes (Signed)
CRITICAL VALUE ALERT  Critical value received:  Calcium 6.3  Date of notification:  11/04/2013  Time of notification:  0520  Critical value read back:Yes.    Nurse who received alert:  Everette RankJennifer Amiri Riechers, RN  MD notified (1st page):  Pola CornELink  Time of first page:  0527  MD notified (2nd page):  Time of second page:  Responding MD:  Message left with [facility] RN Vinnie Langton(Gretchen)  Time MD responded:  [phone]  MD aware.

## 2013-11-04 NOTE — Progress Notes (Signed)
PULMONARY / CRITICAL CARE MEDICINE   Name: [name] MRN: [id number] DOB: March 26, 1956    ADMISSION DATE:  10/30/2013 CONSULTATION DATE: 11/01/13  REFERRING MD : Dr. Blake Divine  PRIMARY SERVICE: TRH  CHIEF COMPLAINT: ETOH Withdrawal    BRIEF PATIENT DESCRIPTION: 58 y.o. male with prior h/o hypertension, DM, alcohol abuse, who presented [facility] 6/27 for ETOH detox.  Found to have hyponatremia & metabolic acidosis. Admitted to the medical floor and decompensated with tachycardia, AMS, respiratory failure.  Tx to ICU 6/29 requiring intubation / TLC, & precedex.     SIGNIFICANT EVENTS:  6/27 - admitted for ETOH detox 6/29 - tachycardia (160 bpm) and irregular respiratory pattern and increased work of breathing, PCCM to see.  6/30 - Hematuria, continues to need pressors 7/01 - Extubated, off pressors, heart rate elevated 7/02 - On 1L [place], more alert/oriented   STUDIES:  6/29 - Abd U/S:  Hepatic steatosis consistent with hepatic cirrhosis, portal vein with hepatopetal flow 6/30 - TTE > LVEF 60%, mild concentric LVH, normal valves and RV  LINES / TUBES:  OETT 6/29 >>> 7/01 R IJ 6/29 >>> R radial arterial line >>> 7/01  CULTURES:  6/27 Staph aureus PCR >> positive (neg MRSA)  6/29 Blood cultures >>> 6/30 Stool culture >>> 7/01 C-diff >>> negative  ANTIBIOTICS:  Vancomycin 6/29 >>>  Zosyn 6/29 >>>    SUBJECTIVE/OVERNIGHT:  Febrile overnight Oliguric even with large positive balance Remains tachycardic Oxygenating well on 1L [place] Continues to have diarrhea Failed Swallow eval after extubation 7/01 More alert/oriented this am  VITAL SIGNS: Temp:  [98.5 F (36.9 C)-102.6 F (39.2 C)] 99.4 F (37.4 C) (07/02 0400) Pulse Rate:  [88-132] 111 (07/02 0700) Resp:  [9-27] 11 (07/02 0700) BP: (82-121)/(43-87) 93/43 mmHg (07/02 0700) SpO2:  [92 %-100 %] 94 % (07/02 0700) FiO2 (%):  [30 %] 30 % (07/01 0800) Weight:  [129 kg (284 lb 6.3 oz)] 129 kg (284 lb 6.3 oz) (07/02 0610) CVP:  [9  mmHg] 9 mmHg VENTILATOR SETTINGS: Vent Mode:  [-]  FiO2 (%):  [30 %] 30 %  INTAKE / OUTPUT: Intake/Output     07/01 0701 - 07/02 0700 07/02 0701 - 07/03 0700   I.V. (mL/kg) 1350 (10.5)    Other 30    NG/GT 0    IV Piggyback 2418.5    Total Intake(mL/kg) 3798.5 (29.4)    Urine (mL/kg/hr) 475 (0.2)    Stool 300 (0.1)    Total Output 775     Net +3023.5           PHYSICAL EXAMINATION: General: chronically ill appearing male in NAD Neuro: alert, follows simple commands, EOM intact, calm demeanor.  HEENT: PERRL, R IJ in place Cardiovascular: sinus tachycardia, no murmurs Lungs: CTA  Abdomen: soft, non-distended, non-tender GI/GU: amber colored urine, mild amount of diarrhea - flexi-seal  Musculoskeletal: MAEs  Skin: 2-3+ pitting, LE edema  LABS:  CBC  Recent Labs Lab 11/01/13 0415 11/02/13 0400 11/03/13 0340  WBC 10.4 8.1 6.4  HGB 10.7* 9.9* 9.5*  HCT 31.0* 28.9* 27.8*  PLT 107* 107* 116*   Coag's No results found for this basename: APTT, INR,  in the last 168 hours BMET  Recent Labs Lab 11/03/13 1100 11/03/13 2020 11/04/13 0415  NA 136* 134* 139  K 3.6* 3.6* 3.6*  CL 95* 93* 98  CO2 25 24 24   BUN 15 17 17   CREATININE 1.21 1.56* 1.62*  GLUCOSE 111* 69* 92   Electrolytes  Recent  Labs Lab 11/02/13 0400  11/02/13 2055 11/03/13 0340 11/03/13 1100 11/03/13 2020 11/04/13 0415  CALCIUM 6.9*  --  6.4* 6.6* 6.4* 6.3* 6.3*  MG 2.0  --   --  1.6  --   --  1.8  PHOS 1.0*  < > 1.7* 1.7*  --   --  2.1*  < > = values in this interval not displayed. Sepsis Markers  Recent Labs Lab 11/01/13 0642 11/01/13 1115  LATICACIDVEN 2.0 1.7   ABG  Recent Labs Lab 11/02/13 0355 11/02/13 1018 11/03/13 0340  PHART 7.382 7.379 7.427  PCO2ART 22.2* 30.2* 40.3  PO2ART 129.0* 111.0* 108.0*   Liver Enzymes  Recent Labs Lab 11/02/13 0400 11/03/13 0340 11/04/13 0415  AST 111* 127* 377*  ALT 157* 133* 178*  ALKPHOS 144* 149* 162*  BILITOT 4.1* 4.0* 4.4*   ALBUMIN 2.3* 2.1* 1.9*   Cardiac Enzymes  Recent Labs Lab 11/01/13 1321  TROPONINI <0.30   Glucose  Recent Labs Lab 11/03/13 1141 11/03/13 1633 11/03/13 1930 11/03/13 2343 11/04/13 0014 11/04/13 0412  GLUCAP 114* 89 73 69* 91 89    Imaging No results found.  ASSESSMENT / PLAN:  NEUROLOGIC  A:  Acute encephalopathy related to ETOH Withdrawal / Liver disease - improving Depression - father passed away 2 mo pta, drinking has increased since.   Has not required ativan in 2 days No complaints of pain Up in the chair once 7/01 P:  PT/OT consult  Change fentanyl to PRN Restart Prozac and wellbutrin Hold Ambien until more alert/oriented Ct Ativan PRN  Monitor for hepatic encephalopathy, but for now continue prn benzo given high EtOH use Hold scheduled benzos considering clinical improvement   GASTROINTESTINAL  A:  Elevated Liver enzymes - h/o etoh abuse - worsening 7/01 Cirrhosis - noted on abd U/S 6/29. Ammonia 7/01: 62 Diarrhea - chronic > etoh/cirrhosis related?, acute> refeeding?, C-diff negative, continues to need flexi-seal Passed Swallow eval 7/02 P:  F/u ammonia, though lab value not correlating well with clinical status Monitor LFTs Start regular diet Consider Lactulose when diarrhea stops Hold NSAID  HEMATOLOGIC  A:  Anemia - related to GI losses - stable Thrombocytopenia - improving P:  F/u CBC in AM Transfuse <7  Ct Lovenox  PULMONARY  A:  Acute Respiratory Failure - secondary to ETOH withdrawal (inability to protect airway) > resolved Doing well on 1L [place] P:  O2 as needed F/u CXR > pneumonia?  CARDIOVASCULAR  A:  H/o RBBB  Sinus tach - due to etoh or b-blocker withdrawal?  P: Scheduled IV metoprolol Restarted home metoprolol on lower dose  RENAL  A:  Metabolic Acidosis with co-comitant Metabolic Alkalosis - elevated anion gap (22) secondary to excessive GI losses and starvation >  stable Hypokalemia Hypophos Hypocalcium Hyponatremia SIADH like syndrome > improving Oliguria and serum creatinine trending up - despite fluid resuscitation, monitor for hepatorenal syndrome or SBP Positive fluid balance   P:  Decreased IVF rate given anasarca Replace electrolytes as indicated F/u CMP, Mag, and phos in AM  INFECTIOUS  A:  R/O infectious etiology of decompensation C-diff negative Febrile overnight > unclear etiology of infection P:  CXR today, U/A today to look for source Cooling blanket if needed continue empiric Vanc/Zosyn while looking for source CT abdomen if fever continues or if looks septic F/u Blood cultures   ENDOCRINE  A:  Hypoglycemia - while off tube feeds TSH 6/29: 1.290  Cortisol 6/29: 23.6  Hgb A1C 6/28: 5.8  P:  CBG monitoring   Summary: Overall patient is doing a lot better this morning. He appears to be more alert and oriented and passed his swallow eval this AM! Continue to monitor fevers and signs and symptoms of infection. PT/OT to work with patient and plan to transfer to SDU status today.   Elsie AmisJessica Koch, ACNP Student  Attending:  I have seen and examined the patient with nurse practitioner/resident and agree with the note above.   Overall looks improved but I'm not clear why he is febrile Plan CXR, U/A, continue antibiotics CT abdomen if no clear source?  [facility]  Heber CarolinaBrent McQuaid, MD [place] [facility] Pager: [phone] Cell: [phone] If no response, call [phone]     11/04/2013, 7:49 AM

## 2013-11-04 NOTE — Evaluation (Signed)
Physical Therapy Evaluation Patient Details Name: Daralene MilchDavid C Carrell MRN: [id number] DOB: [birth year] Today's Date: 11/04/2013   History of Present Illness  58 yo male with H/O ETOH, depression, hyponatremia, metabolic acidosis,VDRF.  Clinical Impression  Pt was able to stand and pivot to recliner with 2 person assist and much difficulty. Pt will benefit from PT to address problems listed in note below.    Follow Up Recommendations SNF;Supervision/Assistance - 24 hour    Equipment Recommendations  None recommended by PT    Recommendations for Other Services       Precautions / Restrictions Precautions Precautions: Fall      Mobility  Bed Mobility Overal bed mobility: Needs Assistance Bed Mobility: Supine to Sit     Supine to sit: Mod assist     General bed mobility comments: assist for trunk  Transfers Overall transfer level: Needs assistance Equipment used: Rolling walker (2 wheeled) Transfers: Sit to/from Stand Sit to Stand: Mod assist;+2 physical assistance Stand pivot transfers: Max assist;+2 physical assistance;+2 safety/equipment       General transfer comment: assist to rise and steady  Ambulation/Gait                Stairs            Wheelchair Mobility    Modified Rankin (Stroke Patients Only)       Balance Overall balance assessment: Needs assistance Sitting-balance support: Bilateral upper extremity supported;Feet supported   Sitting balance - Comments: able to sit at midline                                     Pertinent Vitals/Pain HR 120, sats on 2 l low 90's    Home Living Family/patient expects to be discharged to:: Skilled nursing facility Living Arrangements: Spouse/significant other               Additional Comments: pt reports he has a stool in the shower.  reports using a RW recently    Prior Function Level of Independence: Needs assistance         Comments: intermittent assist with adls;  used RW to walk     Hand Dominance        Extremity/Trunk Assessment   Upper Extremity Assessment: Generalized weakness           Lower Extremity Assessment: Generalized weakness      Cervical / Trunk Assessment: Normal  Communication   Communication: No difficulties (delayed processing; ? HOH)  Cognition Arousal/Alertness: Awake/alert Behavior During Therapy: WFL for tasks assessed/performed Overall Cognitive Status: No family/caregiver present to determine baseline cognitive functioning Area of Impairment: Orientation;Following commands;Safety/judgement Orientation Level: Time;Situation     Following Commands: Follows one step commands consistently       General Comments: delay in processing.    General Comments      Exercises        Assessment/Plan    PT Assessment Patient needs continued PT services  PT Diagnosis Difficulty walking;Generalized weakness;Altered mental status   PT Problem List Decreased strength;Decreased activity tolerance;Decreased mobility;Decreased knowledge of precautions;Decreased safety awareness;Decreased knowledge of use of DME;Decreased cognition  PT Treatment Interventions DME instruction;Gait training;Functional mobility training;Therapeutic activities;Therapeutic exercise;Patient/family education   PT Goals (Current goals can be found in the Care Plan section) Acute Rehab PT Goals Patient Stated Goal: get back to normal; pt likes to water ski PT Goal Formulation: With patient Time For Goal Achievement:  11/18/13 Potential to Achieve Goals: Good    Frequency Min 3X/week   Barriers to discharge        Co-evaluation PT/OT/SLP Co-Evaluation/Treatment: Yes Reason for Co-Treatment: Complexity of the patient's impairments (multi-system involvement) PT goals addressed during session: Mobility/safety with mobility OT goals addressed during session: ADL's and self-care       End of Session Equipment Utilized During  Treatment: Gait belt Activity Tolerance: Patient tolerated treatment well Patient left: in chair;with call bell/phone within reach Nurse Communication: Mobility status;Need for lift equipment         Time: 1208-1235 PT Time Calculation (min): 27 min   Charges:   PT Evaluation $Initial PT Evaluation Tier I: 1 Procedure PT Treatments $Therapeutic Activity: 8-22 mins   PT G Codes:          Rada HayHill, Karrine Kluttz Elizabeth 11/04/2013, 1:33 PM Blanchard KelchKaren Richey Doolittle PT [phone]

## 2013-11-04 NOTE — Progress Notes (Signed)
PHARMACY BRIEF NOTE: VANCOMYCIN  D#4 Vancomycin 1 gram IV q12h / Zosyn 3.375 grams IV q8h for r/o sepsis.  SCr 1.62  A:   SCr continues to rise  P:   1. Repeat vancomycin trough this AM.    2. Hold next vancomycin dose until level reviewed.  Elie Goodyandy Lajoyce Tamura, PharmD, BCPS Pager: [phone] 11/04/2013  5:47 AM

## 2013-11-04 NOTE — Evaluation (Signed)
Occupational Therapy Evaluation Patient Details Name: [name] MilchDavid C [name] MRN: [id number] DOB: 08/10/1955 Today's Date: 11/04/2013    History of Present Illness 58 yo male with H/O ETOH, depression, hyponatremia, metabolic acidosis,VDRF.   Clinical Impression   Pt was admitted for the above.  He will benefit from skilled OT to increase independence with adls.  PLOF is unknown, but pt reports he had help at times and recently used RW.  Goals are set for overall min to mod A.  He currently needs A x 2 and has difficulty weight shifting and stepping.      Follow Up Recommendations  SNF    Equipment Recommendations  3 in 1 bedside comode    Recommendations for Other Services       Precautions / Restrictions Precautions Precautions: Fall      Mobility Bed Mobility Overal bed mobility: Needs Assistance Bed Mobility: Supine to Sit     Supine to sit: Mod assist     General bed mobility comments: assist for trunk  Transfers Overall transfer level: Needs assistance Equipment used: Rolling walker (2 wheeled) Transfers: Sit to/from Stand Sit to Stand: Mod assist;+2 physical assistance Stand pivot transfers: Max assist;+2 physical assistance;+2 safety/equipment       General transfer comment: assist to rise and steady    Balance Overall balance assessment: Needs assistance Sitting-balance support: Bilateral upper extremity supported;Feet supported   Sitting balance - Comments: able to sit at midline                                    ADL Overall ADL's : Needs assistance/impaired     Grooming: Set up;Sitting   Upper Body Bathing: Sitting;Min guard   Lower Body Bathing: Maximal assistance;+2 for physical assistance;Sit to/from stand   Upper Body Dressing : Minimal assistance;Sitting (lines)   Lower Body Dressing: Maximal assistance;+2 for physical assistance;Sit to/from stand   Toilet Transfer: Maximal assistance;+2 for physical assistance   Toileting-  Clothing Manipulation and Hygiene: Maximal assistance;+2 for physical assistance;Sit to/from stand         General ADL Comments: Pt transferred to chair:  he was able to take small steps but had difficulty shifting/moving feet over to L.  Need to guard and help with multiple lines.  Pt with flexiseal today     Vision                     Perception     Praxis      Pertinent Vitals/Pain No c/o pain.  VSS     Hand Dominance     Extremity/Trunk Assessment Upper Extremity Assessment Upper Extremity Assessment: Generalized weakness   Lower Extremity Assessment Lower Extremity Assessment: Generalized weakness   Cervical / Trunk Assessment Cervical / Trunk Assessment: Normal   Communication Communication Communication: No difficulties (delayed processing; ? HOH)   Cognition Arousal/Alertness: Awake/alert Behavior During Therapy: WFL for tasks assessed/performed Overall Cognitive Status: No family/caregiver present to determine baseline cognitive functioning Area of Impairment: Orientation;Following commands;Safety/judgement Orientation Level: Time;Situation     Following Commands: Follows one step commands consistently       General Comments: delay in processing.   General Comments       Exercises       Shoulder Instructions      Home Living Family/patient expects to be discharged to:: Skilled nursing facility Living Arrangements: Spouse/significant other  Additional Comments: pt reports he has a stool in the shower.  reports using a RW recently      Prior Functioning/Environment Level of Independence: Needs assistance        Comments: intermittent assist with adls; used RW to walk    OT Diagnosis: Generalized weakness   OT Problem List: Decreased strength;Decreased activity tolerance;Impaired balance (sitting and/or standing);Decreased knowledge of use of DME or AE;Impaired UE functional use   OT  Treatment/Interventions: Self-care/ADL training;Balance training;Patient/family education;Cognitive remediation/compensation;Therapeutic activities;DME and/or AE instruction    OT Goals(Current goals can be found in the care plan section) Acute Rehab OT Goals Patient Stated Goal: get back to normal; pt likes to water ski OT Goal Formulation: With patient Time For Goal Achievement: 11/18/13 Potential to Achieve Goals: Good ADL Goals Pt Will Perform Grooming: with min assist;standing Pt Will Perform Lower Body Dressing: with min assist;sit to/from stand;with adaptive equipment Pt Will Transfer to Toilet: with min assist;stand pivot transfer;bedside commode Pt/caregiver will Perform Home Exercise Program: Increased strength;Right Upper extremity;Left upper extremity;With theraband;With written HEP provided;With Supervision (level 1, mod cues) Additional ADL Goal #1: Pt will follow cues and answer questions with no more than 5 second delay  OT Frequency: Min 2X/week   Barriers to D/C:            Co-evaluation PT/OT/SLP Co-Evaluation/Treatment: Yes Reason for Co-Treatment: Complexity of the patient's impairments (multi-system involvement) PT goals addressed during session: Mobility/safety with mobility OT goals addressed during session: ADL's and self-care      End of Session Nurse Communication: Need for lift equipment;Mobility status  Activity Tolerance: Patient limited by fatigue Patient left: in chair;with call bell/phone within reach   Time: 1208-1232 OT Time Calculation (min): 24 min Charges:  OT General Charges $OT Visit: 1 Procedure OT Evaluation $Initial OT Evaluation Tier I: 1 Procedure OT Treatments $Therapeutic Activity: 8-22 mins G-Codes:    Avie Checo 11/04/2013, 1:31 PM  Marica OtterMaryellen Burr Soffer, OTR/L [phone] 11/04/2013

## 2013-11-04 NOTE — Progress Notes (Signed)
CARE MANAGEMENT NOTE 11/04/2013  Patient:  [name],[name]   Account Number:  [id number]  Date Initiated:  11/01/2013  Documentation initiated by:  DAVIS,RHONDA  Subjective/Objective Assessment:   etoh abuse, a.fib,metabolic acicdosis/     Action/Plan:   patient remains in sdu [id number] due to new onset of a.fib, and increased agitiation pt now on iv cardizem and precedex.   Anticipated DC Date:  11/07/2013   Anticipated DC Plan:  HOME/SELF CARE  In-house referral  Clinical Social Worker      DC Planning Services  NA      [facility]  [facility]   Choice offered to / List presented to:  NA   DME arranged  NA      DME agency  NA     HH arranged  NA      HH agency  NA   Status of service:  In process, will continue to follow Medicare Important Message given?  NA - LOS <3 / Initial given by admissions (If response is "NO", the following Medicare IM given date fields will be blank) Date Medicare IM given:   Medicare IM given by:   Date Additional Medicare IM given:   Additional Medicare IM given by:    Discharge Disposition:    Per UR Regulation:  Reviewed for med. necessity/level of care/duration of stay  If discussed at Long Length of Stay Meetings, dates discussed:    Comments:  [id number]/Rhonda Stark JockDavis, RN, BSN, [place] [phone] Chart Reviewed for discharge and hospital needs. Discharge needs at time of review: None present will follow for needs. Review of patient progress due on [id number].   [id number]/Rhonda Earlene PlaterDavis, RN, BSN, [place] [phone] Chart Reviewed for discharge and hospital needs. Discharge needs at time of review: None present will follow for needs. IM UPDATE NEEDED ON [id number] IF REMAINS INPATIENT Review of patient progress due on [id number].

## 2013-11-04 NOTE — Progress Notes (Signed)
ANTIBIOTIC CONSULT NOTE - FOLLOW UP  Pharmacy Consult for vancomycin / Zosyn Indication: sepsis  No Known Allergies  Patient Measurements: Height: 5\' 11"  (180.3 cm) Weight: 284 lb 6.3 oz (129 kg) IBW/kg (Calculated) : 75.3   Vital Signs: Temp: 99.9 F (37.7 C) (07/02 1200) Temp src: Oral (07/02 1200) BP: 124/56 mmHg (07/02 0900) Pulse Rate: 116 (07/02 0900) Intake/Output from previous day: 07/01 0701 - 07/02 0700 In: 3798.5 [I.V.:1350; IV Piggyback:2418.5] Out: 775 [Urine:475; Stool:300] Intake/Output from this shift: Total I/O In: 150 [I.V.:150] Out: -   Labs:  Recent Labs  11/02/13 0400  11/03/13 0340 11/03/13 1100 11/03/13 2020 11/04/13 0415  WBC 8.1  --  6.4  --   --   --   HGB 9.9*  --  9.5*  --   --   --   PLT 107*  --  116*  --   --   --   CREATININE 1.31  < > 1.13 1.21 1.56* 1.62*  < > = values in this interval not displayed. Estimated Creatinine Clearance: 68.9 ml/min (by C-G formula based on Cr of 1.62).  Recent Labs  11/03/13 2020 11/04/13 0915  VANCOTROUGH 13.4  --   VANCORANDOM  --  18.2     Microbiology: Recent Results (from the past 720 hour(s))  MRSA PCR SCREENING     Status: None   Collection Time    11/01/13  8:31 AM      Result Value Ref Range Status   MRSA by PCR NEGATIVE  NEGATIVE Final   Comment:            The GeneXpert MRSA Assay (FDA     approved for NASAL specimens     only), is one component of a     comprehensive MRSA colonization     surveillance program. It is not     intended to diagnose MRSA     infection nor to guide or     monitor treatment for     MRSA infections.  CULTURE, BLOOD (ROUTINE X 2)     Status: None   Collection Time    11/01/13  1:49 PM      Result Value Ref Range Status   Specimen Description BLOOD CENTRAL LINE   Final   Special Requests BOTTLES DRAWN AEROBIC AND ANAEROBIC [id number]   Final   Culture  Setup Time     Final   Value: 11/01/2013 22:54     Performed at [facility]   Culture      Final   Value:        BLOOD CULTURE RECEIVED NO GROWTH TO DATE CULTURE WILL BE HELD FOR 5 DAYS BEFORE ISSUING A FINAL NEGATIVE REPORT     Performed at [facility]   Report Status PENDING   Incomplete  CULTURE, BLOOD (ROUTINE X 2)     Status: None   Collection Time    11/01/13  4:34 PM      Result Value Ref Range Status   Specimen Description BLOOD RIGHT ARM   Final   Special Requests BOTTLES DRAWN AEROBIC AND ANAEROBIC 10CC   Final   Culture  Setup Time     Final   Value: 11/01/2013 22:51     Performed at [facility]   Culture     Final   Value:        BLOOD CULTURE RECEIVED NO GROWTH TO DATE CULTURE WILL BE HELD FOR 5 DAYS BEFORE ISSUING A  FINAL NEGATIVE REPORT     Performed at [facility]   Report Status PENDING   Incomplete  STOOL CULTURE     Status: None   Collection Time    11/02/13  8:52 PM      Result Value Ref Range Status   Specimen Description STOOL   Final   Special Requests NONE   Final   Culture     Final   Value: NO SUSPICIOUS COLONIES, CONTINUING TO HOLD     Performed at [facility]   Report Status PENDING   Incomplete  CLOSTRIDIUM DIFFICILE BY PCR     Status: None   Collection Time    11/03/13  3:53 AM      Result Value Ref Range Status   C difficile by pcr NEGATIVE  NEGATIVE Final   Comment: [facility]    Anti-infectives   Start     Dose/Rate Route Frequency Ordered Stop   11/04/13 2200  vancomycin (VANCOCIN) 1,000 mg in sodium chloride 0.9 % 250 mL IVPB     1,000 mg 250 mL/hr over 60 Minutes Intravenous Every 12 hours 11/04/13 0544     11/02/13 1600  vancomycin (VANCOCIN) 1,500 mg in sodium chloride 0.9 % 500 mL IVPB  Status:  Discontinued     1,500 mg 250 mL/hr over 120 Minutes Intravenous Every 24 hours 11/01/13 1349 11/02/13 0542   11/02/13 1000  vancomycin (VANCOCIN) IVPB 1000 mg/200 mL premix  Status:  Discontinued     1,000 mg 200 mL/hr over 60 Minutes Intravenous Every 12 hours 11/02/13  0542 11/02/13 0851   11/02/13 1000  vancomycin (VANCOCIN) 1,000 mg in sodium chloride 0.9 % 250 mL IVPB  Status:  Discontinued     1,000 mg 250 mL/hr over 60 Minutes Intravenous Every 12 hours 11/02/13 0851 11/04/13 0544   11/01/13 1500  vancomycin (VANCOCIN) 2,000 mg in sodium chloride 0.9 % 500 mL IVPB     2,000 mg 250 mL/hr over 120 Minutes Intravenous  Once 11/01/13 1348 11/01/13 1810   11/01/13 1400  piperacillin-tazobactam (ZOSYN) IVPB 3.375 g     3.375 g 12.5 mL/hr over 240 Minutes Intravenous 3 times per day 11/01/13 1343        Assessment: 58 y/o M admitted for EtOH detoxification and decompensated with hypotension tachycardia, respiratory failure, leukocytosis; lactate was WNL.  Empiric vancomycin and Zosyn started for r/o sepsis, pharmacy dosing assistance requested.  AKI present with elevated SCr.  7/2:  D#4 vancomycin 2 grams x 1, then 1 gram q12h / Zosyn 3.375 grams IV q8h (extended-infusion) for r/o sepsis  Tm 102.6  SCr slowly rising  Blood cultures no growth to date  U/A from today (+)  Recent diarrhea; stool negative for C.diff  Vancomycin level (ordered as random but corresponds to trough) therapeutic but rising.    Goal of Therapy:  Appropriate antibiotic dosing for renal function; eradication of infection. Vancomycin trough 15-20   Plan:  1. Reduce vancomycin to 1250 mg IV q24h, next dose 10 pm 2. Continue Zosyn 3.375 grams IV q8h (extended-infusion, each dose over 4 hours), 3. Follow serum creatinine closely; recheck vanco level when appropriate. 4. Follow cultures, clinical course.  Elie Goodyandy Aanika Defoor, PharmD, BCPS Pager: [phone] 11/04/2013  12:50 PM

## 2013-11-04 NOTE — Progress Notes (Signed)
Speech Language Pathology Treatment: Dysphagia  Patient Details Name: [name] MRN: [id number] DOB: 08-17-1955 Today's Date: 11/04/2013 Time: 0298-4730 SLP Time Calculation (min): 12 min  Assessment / Plan / Recommendation Clinical Impression  F/u diagnostic treatment  Complete. Patient alert and cooperative, requiring min SLP cueing to maintain sustained attention to task but with much improved swallowing function. Patient able to consume diagnostic po trials without overt indication of aspiration, functional oral transit time, and clear vocal quality. Independent with slow rate of intake and small bites. Educated patient regarding risk of aspiration due to intubation and need to adhere to safe swallowing precautions while recovering. No skilled SLP needs indicated at this time. Signing off. Please reconsult as needed.      HPI HPI: 58 yo male adm to [facility] to detox and he had respiratory distress, metabolic acidosis and hyponatremia requiring intubation 6/29-11/03/13.  Swallow evaluation ordered today, extubated at 28 today with good tolerance.       SLP Plan  All goals met    Recommendations Diet recommendations: Regular;Thin liquid Liquids provided via: Cup;Straw Medication Administration: Whole meds with liquid Supervision: Patient able to self feed Compensations: Slow rate;Small sips/bites Postural Changes and/or Swallow Maneuvers: Seated upright 90 degrees              Oral Care Recommendations: Oral care BID Follow up Recommendations: None Plan: All goals met    [place] MA, CCC-SLP [phone]   Marnesha Gagen Meryl 11/04/2013, 9:15 AM

## 2013-11-05 ENCOUNTER — Inpatient Hospital Stay (HOSPITAL_COMMUNITY): Payer: Medicare Other

## 2013-11-05 ENCOUNTER — Encounter (HOSPITAL_COMMUNITY): Payer: Self-pay | Admitting: Radiology

## 2013-11-05 LAB — URINALYSIS, ROUTINE W REFLEX MICROSCOPIC
GLUCOSE, UA: NEGATIVE mg/dL
Ketones, ur: 15 mg/dL — AB
Nitrite: NEGATIVE
PH: 5.5 (ref 5.0–8.0)
Protein, ur: 30 mg/dL — AB
Specific Gravity, Urine: 1.026 (ref 1.005–1.030)
Urobilinogen, UA: 1 mg/dL (ref 0.0–1.0)

## 2013-11-05 LAB — CBC
HCT: 30.6 % — ABNORMAL LOW (ref 39.0–52.0)
Hemoglobin: 10.1 g/dL — ABNORMAL LOW (ref 13.0–17.0)
MCH: 34.2 pg — ABNORMAL HIGH (ref 26.0–34.0)
MCHC: 33 g/dL (ref 30.0–36.0)
MCV: 103.7 fL — ABNORMAL HIGH (ref 78.0–100.0)
Platelets: 157 K/uL (ref 150–400)
RBC: 2.95 MIL/uL — ABNORMAL LOW (ref 4.22–5.81)
RDW: 16.5 % — ABNORMAL HIGH (ref 11.5–15.5)
WBC: 7.1 K/uL (ref 4.0–10.5)

## 2013-11-05 LAB — GLUCOSE, CAPILLARY
GLUCOSE-CAPILLARY: 65 mg/dL — AB (ref 70–99)
Glucose-Capillary: 73 mg/dL (ref 70–99)
Glucose-Capillary: 55 mg/dL — ABNORMAL LOW (ref 70–99)
Glucose-Capillary: 63 mg/dL — ABNORMAL LOW (ref 70–99)
Glucose-Capillary: 70 mg/dL (ref 70–99)
Glucose-Capillary: 70 mg/dL (ref 70–99)
Glucose-Capillary: 108 mg/dL — ABNORMAL HIGH (ref 70–99)
Glucose-Capillary: 70 mg/dL (ref 70–99)

## 2013-11-05 LAB — PROTIME-INR
INR: 1.29 (ref 0.00–1.49)
PROTHROMBIN TIME: 16.1 s — AB (ref 11.6–15.2)

## 2013-11-05 LAB — COMPREHENSIVE METABOLIC PANEL WITH GFR
ALT: 177 U/L — ABNORMAL HIGH (ref 0–53)
AST: 300 U/L — ABNORMAL HIGH (ref 0–37)
Albumin: 1.9 g/dL — ABNORMAL LOW (ref 3.5–5.2)
Alkaline Phosphatase: 198 U/L — ABNORMAL HIGH (ref 39–117)
Anion gap: 18 — ABNORMAL HIGH (ref 5–15)
BUN: 20 mg/dL (ref 6–23)
CO2: 24 meq/L (ref 19–32)
Calcium: 7.3 mg/dL — ABNORMAL LOW (ref 8.4–10.5)
Chloride: 97 meq/L (ref 96–112)
Creatinine, Ser: 1.61 mg/dL — ABNORMAL HIGH (ref 0.50–1.35)
GFR calc Af Amer: 53 mL/min — ABNORMAL LOW
GFR calc non Af Amer: 46 mL/min — ABNORMAL LOW
Glucose, Bld: 60 mg/dL — ABNORMAL LOW (ref 70–99)
Potassium: 3.7 meq/L (ref 3.7–5.3)
Sodium: 139 meq/L (ref 137–147)
Total Bilirubin: 4.7 mg/dL — ABNORMAL HIGH (ref 0.3–1.2)
Total Protein: 4.9 g/dL — ABNORMAL LOW (ref 6.0–8.3)

## 2013-11-05 LAB — URINE MICROSCOPIC-ADD ON

## 2013-11-05 LAB — MAGNESIUM: Magnesium: 1.7 mg/dL (ref 1.5–2.5)

## 2013-11-05 LAB — SODIUM, URINE, RANDOM: Sodium, Ur: <20 mEq/L

## 2013-11-05 LAB — AMMONIA: Ammonia: 52 umol/L (ref 11–60)

## 2013-11-05 LAB — PHOSPHORUS: PHOSPHORUS: 2.2 mg/dL — AB (ref 2.3–4.6)

## 2013-11-05 LAB — CREATININE, URINE, RANDOM: Creatinine, Urine: 207.63 mg/dL

## 2013-11-05 MED ORDER — NOREPINEPHRINE BITARTRATE 1 MG/ML IV SOLN
2.0000 ug/min | INTRAVENOUS | Status: DC
  Administered 2013-11-05 – 2013-11-07 (×2): 2 ug/min via INTRAVENOUS
  Filled 2013-11-05 (×2): qty 4

## 2013-11-05 MED ORDER — IOHEXOL 300 MG/ML  SOLN: 25.0000 mL | Freq: Once | INTRAMUSCULAR | Status: AC | PRN

## 2013-11-05 MED ORDER — HEPARIN SODIUM (PORCINE) 5000 UNIT/ML IJ SOLN
5000.0000 [IU] | Freq: Three times a day (TID) | INTRAMUSCULAR | Status: DC
  Administered 2013-11-05 – 2013-11-10 (×16): 5000 [IU] via SUBCUTANEOUS
  Filled 2013-11-05 (×18): qty 1

## 2013-11-05 MED ORDER — ENSURE COMPLETE PO LIQD
237.0000 mL | Freq: Two times a day (BID) | ORAL | Status: DC
  Administered 2013-11-05 – 2013-11-10 (×11): 237 mL via ORAL

## 2013-11-05 MED ORDER — ALBUMIN HUMAN 25 % IV SOLN
50.0000 g | Freq: Two times a day (BID) | INTRAVENOUS | Status: AC
  Administered 2013-11-05 (×4): 12.5 g via INTRAVENOUS
  Filled 2013-11-05 (×4): qty 50

## 2013-11-05 MED ORDER — RIFAXIMIN 550 MG PO TABS
550.0000 mg | ORAL_TABLET | Freq: Two times a day (BID) | ORAL | Status: DC
  Administered 2013-11-05 – 2013-11-10 (×11): 550 mg via ORAL
  Filled 2013-11-05 (×12): qty 1

## 2013-11-05 NOTE — Progress Notes (Signed)
NUTRITION FOLLOW UP  Intervention:   - Ensure Complete BID - Changed meals to be sent automatically as pt sometimes forgets to order/sleeps and doesn't order meals  - Recommend continued monitoring and replacement of phopshorus - RD to monitor plan of care   Nutrition Dx:   Inadequate oral intake related to inability to eat as evidenced by NPO - no longer appropriate, diet advanced  New nutrition dx: Altered GI function related to alcohol abuse as evidenced by MD notes.   Goal:   Advance diet as tolerated to regular diet -  Met  New goal: Pt to consume >90% of meals/supplements  Monitor:   Weights, labs, intake  Assessment:   Pt with prior h/o hypertension, dm, alcohol abuse, came in to the hospital for alcohol detox. Patient appears confused and most of the history was available from the patients wife. As per the wife, pt has a h/o alcohol abuse for more than 30 years, pt's father died 2 months ago and since then patient has been depressed and drinking more.   6/29: Experienced increased tachypnea, lethargy, with elevated heart rate this afternoon with pt being as responsive as previously per RN notes. Intubated today for airway protection and maintenance. Alone in room on vent. Started TF via OGT of Vital High Protein at 29m/hr increase by 130mevery 12 hours to goal of 6086mr with Prostat 30m73mD. Goal rate will provide 1640 calories, 156g protein, and 1204ml6me water that will meet 72% estimated calorie needs, 100% estimated protein needs from verbal order from MD for RD to manage TF. Advanced slowly due to refeeding risk   6/30: Remains on vent, alone in room. Per RN, pt without any TF residuals. TF increased to 30ml 35m morning of Vital High Protein. Potassium and phosphorus low and are being replaced.  7/1: Extubated and TF d/c today. Alone in room. Slow to respond to questions but did report eating well PTA with stable weight. Potassium and phosphorus low but  improving, getting replaced.   7/3: Diet advanced to regular yesterday after SLP evaluation. Weight up 51 pounds since admission, +22L per I/Os. Met with pt who reports he hadn't eaten anything today because no one brought him a menu. Assisted pt with ordering lunch. Phosphorus remains low but improving, getting IV replacement. Pt reported he had "messed himself" in the bed, notified nursing.    Potassium  Date/Time Value Ref Range Status  11/05/2013  5:30 AM 3.7  3.7 - 5.3 mEq/L Final  11/04/2013  4:15 AM 3.6* 3.7 - 5.3 mEq/L Final  11/03/2013  8:20 PM 3.6* 3.7 - 5.3 mEq/L Final    Phosphorus  Date/Time Value Ref Range Status  11/05/2013  5:30 AM 2.2* 2.3 - 4.6 mg/dL Final  11/04/2013  4:15 AM 2.1* 2.3 - 4.6 mg/dL Final  11/03/2013  3:40 AM 1.7* 2.3 - 4.6 mg/dL Final    Magnesium  Date/Time Value Ref Range Status  11/05/2013  5:30 AM 1.7  1.5 - 2.5 mg/dL Final  11/04/2013  4:15 AM 1.8  1.5 - 2.5 mg/dL Final  11/03/2013  3:40 AM 1.6  1.5 - 2.5 mg/dL Final        Height: Ht Readings from Last 1 Encounters:  11/01/13 5' 11"  (1.803 m)    Weight Status:   Wt Readings from Last 1 Encounters:  11/05/13 291 lb 0.1 oz (132 kg)  Admit wt         240 lbs 1.3 oz (108.9 kg) Net I/Os: +22L  Re-estimated needs:  Kcal: 1950-2150 Protein: 95-115g Fluid: 1.9-2.1L/day   Skin: Intact   Diet Order: General   Intake/Output Summary (Last 24 hours) at 11/05/13 1433 Last data filed at 11/05/13 0600  Gross per 24 hour  Intake    510 ml  Output    500 ml  Net     10 ml    Last BM: 7/2   Labs:   Recent Labs Lab 11/03/13 0340  11/03/13 2020 11/04/13 0415 11/05/13 0530  NA 136*  < > 134* 139 139  K 2.8*  < > 3.6* 3.6* 3.7  CL 94*  < > 93* 98 97  CO2 25  < > 24 24 24   BUN 15  < > 17 17 20   CREATININE 1.13  < > 1.56* 1.62* 1.61*  CALCIUM 6.6*  < > 6.3* 6.3* 7.3*  MG 1.6  --   --  1.8 1.7  PHOS 1.7*  --   --  2.1* 2.2*  GLUCOSE 166*  < > 69* 92 60*  < > = values in this interval not  displayed.  CBG (last 3)   Recent Labs  11/05/13 0729 11/05/13 0823 11/05/13 1145  GLUCAP 55* 70 70    Scheduled Meds: . antiseptic oral rinse  15 mL Mouth Rinse BID  . buPROPion  100 mg Oral Daily  . enoxaparin (LOVENOX) injection  40 mg Subcutaneous Q1200  . FLUoxetine  40 mg Oral Daily  . insulin aspart  0-9 Units Subcutaneous TID WC  . metoprolol tartrate  12.5 mg Oral BID  . piperacillin-tazobactam (ZOSYN)  IV  3.375 g Intravenous 3 times per day  . rifaximin  550 mg Oral BID  . thiamine  100 mg Oral Daily   Or  . thiamine  100 mg Intravenous Daily  . vancomycin  1,250 mg Intravenous Q24H    Continuous Infusions: . dextrose 5 % and 0.9% NaCl 10 mL/hr at 11/04/13 [place], RD, [place] Pager [phone] Weekend/After Hours Pager

## 2013-11-05 NOTE — Progress Notes (Signed)
Pt had a CBG of 65, given orange juice and CBG rechecked, will continue to monitor.

## 2013-11-05 NOTE — Progress Notes (Addendum)
PROGRESS NOTE  Daralene MilchDavid C Angert YQM:[id number] DOB: 05/13/1955 DOA: 10/30/2013 PCP: Darnelle BosSBORNE,JAMES CHARLES, MD  HPI: 58 y.o. male with prior h/o hypertension, DM, alcohol abuse, who presented [facility] 6/27 for ETOH detox. Found to have hyponatremia & metabolic acidosis. Admitted to the medical floor and decompensated with tachycardia, AMS, respiratory failure. Tx to ICU 6/29 requiring intubation / TLC, & precedex.   Assessment/Plan: Acute pancreatitis, -  no lipase on admission, not clear whether he had symptoms c/w with it.  - monitor for now, no epigastric pain today, no nausea/vomiting Fever without clear infectious source, may be due to pancreatitis, cultures negative, fever curve looks better today  - monitor and consider d/c antibiotics if cultures remain negative Anasarca - patient up 22 liters in the past 4 days, oliguric and with worsening renal function - asked renal assistance, probably developing hepatorenal syndrome, .  Acute renal failure - likely HRS, obtain urinary sodium - nephrology consulted, appreciate input. 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 Diarrhea - C diff negative, monitor.  Acute pancreatis - per CT [place] Acute encephalopathy - due to liver disease, ICU stay, with asterixis this morning, started rifaximin  Anemia - due to chronic ETOH, malnutrition, critical illness, liver disease Thrombocytopenia - due to chronic ETOH, malnutrition, critical illness, liver disease Acute hypoxic respiratory failure - due to withdrawal (inability to protect airway) requiring vent support, stable now and extubated.  Depression - will consider psych evaluation, patient currently confused.  ETOH withdrawal - stable now  SIGNIFICANT EVENTS:  6/27 - admitted for ETOH detox  6/29 - tachycardia (160 bpm) and irregular respiratory pattern and increased work of  breathing, PCCM to see.  6/30 - Hematuria, continues to need pressors  7/01 - Extubated, off pressors, heart rate elevated  7/02 - On 1L , more alert/oriented  7/3 - TRH transfer  STUDIES:  6/29 - Abd U/S: Hepatic steatosis consistent with hepatic cirrhosis, portal vein with hepatopetal flow  6/30 - TTE > LVEF 60%, mild concentric LVH, normal valves and RV   LINES / TUBES:  OETT 6/29 >>> 7/01  R IJ 6/29 >>>  R radial arterial line >>> 7/01   CULTURES:  6/27 Staph aureus PCR >> positive (neg MRSA)  6/29 Blood cultures >>>  6/30 Stool culture >>>  7/01 C-diff >>> negative   ANTIBIOTICS:  Vancomycin 6/29 >>>  Zosyn 6/29 >>>    Diet: regular  Fluids: none  DVT Prophylaxis: heparin   Code Status: Full Family Communication: d/w wife at bedside and sister Corrie DandyMary over the phone ([phone], [place] [phone]) Disposition Plan: inpatient  Consultants:  PCCM  Nephrology    HPI/Subjective: - he is feeling well, no chest pain, no breathing difficulties.   Objective: Filed Vitals:   11/05/13 0302 11/05/13 0400 11/05/13 0500 11/05/13 0600  BP: 92/48 88/45 106/55 94/52  Pulse: 97 98 101 101  Temp:  99.1 F (37.3 C)    TempSrc:  Oral    Resp: 11 14 13 19   Height:      Weight:  132 kg (291 lb 0.1 oz)    SpO2: 94% 97% 97% 93%    Intake/Output Summary (Last 24 hours) at 11/05/13 0733 Last data filed at 11/05/13 0600  Gross per 24 hour  Intake 1497.5 ml  Output    625 ml  Net  872.5  ml   Filed Weights   10/31/13 1213 11/04/13 0610 11/05/13 0400  Weight: 108.9 kg (240 lb 1.3 oz) 129 kg (284 lb 6.3 oz) 132 kg (291 lb 0.1 oz)    Exam:   General:  NAD, morbidly obese male  Cardiovascular: regular rate and rhythm, without MRG  Respiratory: good air movement, clear to auscultation throughout, no wheezing, ronchi or rales  Abdomen: obese, soft, non tender, clinically without ascites  MSK: 2-3+ pitting edema, anasarca  Neuro: non focal  Data Reviewed: Basic  Metabolic Panel:  Recent Labs Lab 11/01/13 0642  11/02/13 0400 11/02/13 1400 11/02/13 2055 11/03/13 0340 11/03/13 1100 11/03/13 2020 11/04/13 0415 11/05/13 0530  NA  --   < > 126*  --  136* 136* 136* 134* 139 139  K  --   < > 2.8* 2.9* 2.9* 2.8* 3.6* 3.6* 3.6* 3.7  CL  --   < > 84*  --  94* 94* 95* 93* 98 97  CO2  --   < > 13*  --  22 25 25 24 24 24   GLUCOSE  --   < > 170*  --  183* 166* 111* 69* 92 60*  BUN  --   < > 16  --  16 15 15 17 17 20   CREATININE  --   < > 1.31  --  1.21 1.13 1.21 1.56* 1.62* 1.61*  CALCIUM  --   < > 6.9*  --  6.4* 6.6* 6.4* 6.3* 6.3* 7.3*  MG 1.6  --  2.0  --   --  1.6  --   --  1.8 1.7  PHOS  --   < > 1.0* 1.4* 1.7* 1.7*  --   --  2.1* 2.2*  < > = values in this interval not displayed. Liver Function Tests:  Recent Labs Lab 11/01/13 1115 11/02/13 0400 11/03/13 0340 11/04/13 0415 11/05/13 0530  AST 177* 111* 127* 377* 300*  ALT 215* 157* 133* 178* 177*  ALKPHOS 152* 144* 149* 162* 198*  BILITOT 4.5* 4.1* 4.0* 4.4* 4.7*  PROT 5.8* 5.0* 4.8* 4.9* 4.9*  ALBUMIN 2.7* 2.3* 2.1* 1.9* 1.9*    Recent Labs Lab 10/31/13 1655 11/02/13 0400 11/03/13 1654 11/05/13 0530  AMMONIA 28 39 62* 52   CBC:  Recent Labs Lab 10/30/13 1527 11/01/13 0415 11/02/13 0400 11/03/13 0340 11/05/13 0530  WBC 12.8* 10.4 8.1 6.4 7.1  HGB 13.5 10.7* 9.9* 9.5* 10.1*  HCT 37.8* 31.0* 28.9* 27.8* 30.6*  MCV 95.9 98.4 99.3 99.6 103.7*  PLT 145* 107* 107* 116* 157   Cardiac Enzymes:  Recent Labs Lab 11/01/13 1321  TROPONINI <0.30   CBG:  Recent Labs Lab 11/04/13 1136 11/04/13 1609 11/04/13 1952 11/05/13 0014 11/05/13 0100  GLUCAP 93 98 77 65* 73    Recent Results (from the past 240 hour(s))  MRSA PCR SCREENING     Status: None   Collection Time    11/01/13  8:31 AM      Result Value Ref Range Status   MRSA by PCR NEGATIVE  NEGATIVE Final   Comment:            The GeneXpert MRSA Assay (FDA     approved for NASAL specimens     only), is one  component of a     comprehensive MRSA colonization     surveillance program. It is not     intended to diagnose MRSA     infection nor to guide or  monitor treatment for     MRSA infections.  CULTURE, BLOOD (ROUTINE X 2)     Status: None   Collection Time    11/01/13  1:49 PM      Result Value Ref Range Status   Specimen Description BLOOD CENTRAL LINE   Final   Special Requests BOTTLES DRAWN AEROBIC AND ANAEROBIC [facility]   Final   Culture  Setup Time     Final   Value: 11/01/2013 22:54     Performed at [facility]   Culture     Final   Value:        BLOOD CULTURE RECEIVED NO GROWTH TO DATE CULTURE WILL BE HELD FOR 5 DAYS BEFORE ISSUING A FINAL NEGATIVE REPORT     Performed at [facility]   Report Status PENDING   Incomplete  CULTURE, BLOOD (ROUTINE X 2)     Status: None   Collection Time    11/01/13  4:34 PM      Result Value Ref Range Status   Specimen Description BLOOD RIGHT ARM   Final   Special Requests BOTTLES DRAWN AEROBIC AND ANAEROBIC 10CC   Final   Culture  Setup Time     Final   Value: 11/01/2013 22:51     Performed at [facility]   Culture     Final   Value:        BLOOD CULTURE RECEIVED NO GROWTH TO DATE CULTURE WILL BE HELD FOR 5 DAYS BEFORE ISSUING A FINAL NEGATIVE REPORT     Performed at [facility]   Report Status PENDING   Incomplete  STOOL CULTURE     Status: None   Collection Time    11/02/13  8:52 PM      Result Value Ref Range Status   Specimen Description STOOL   Final   Special Requests NONE   Final   Culture     Final   Value: NO SUSPICIOUS COLONIES, CONTINUING TO HOLD     Performed at [facility]   Report Status PENDING   Incomplete  CLOSTRIDIUM DIFFICILE BY PCR     Status: None   Collection Time    11/03/13  3:53 AM      Result Value Ref Range Status   C difficile by pcr NEGATIVE  NEGATIVE Final   Comment: Performed at [facility]     Studies: Dg Chest Port 1 View  11/04/2013   CLINICAL  DATA:  Fever  EXAM: PORTABLE CHEST - 1 VIEW  COMPARISON:  November 02, 2013  FINDINGS: Endotracheal tube and nasogastric tube have been removed. Central catheter tip is in the superior vena cava. No pneumothorax. There is atelectatic change in both lung bases, more on the left than on the right. Elsewhere lungs are clear. Heart size and pulmonary vascularity are normal. No adenopathy.  IMPRESSION: Bilateral lower lobe atelectatic change, slightly more on the left than on the right. No pneumothorax. No edema or consolidation.   Electronically Signed   By: Bretta Bang M.D.   On: 11/04/2013 10:57    Scheduled Meds: . antiseptic oral rinse  15 mL Mouth Rinse BID  . buPROPion  100 mg Oral Daily  . enoxaparin (LOVENOX) injection  40 mg Subcutaneous Q1200  . FLUoxetine  40 mg Oral Daily  . insulin aspart  0-9 Units Subcutaneous TID WC  . metoprolol tartrate  12.5 mg Oral BID  . piperacillin-tazobactam (ZOSYN)  IV  3.375 g  Intravenous 3 times per day  . thiamine  100 mg Oral Daily   Or  . thiamine  100 mg Intravenous Daily  . vancomycin  1,250 mg Intravenous Q24H   Continuous Infusions: . dextrose 5 % and 0.9% NaCl 10 mL/hr at 11/04/13 1015    Active Problems:   DIABETES MELLITUS   Morbid obesity   DEPRESSIVE DISORDER NOT ELSEWHERE CLASSIFIED   HYPERTENSION   Acute alcoholic hepatitis   ALCOHOL ABUSE, HX OF   Alcohol withdrawal   Hyponatremia   Elevated liver function tests   Metabolic acidosis   Acute renal failure   Acute respiratory failure   Hypovolemic shock  Time spent: 65 minutes total for chart review, exam, family discussion, consults  This note has been created with Education officer, environmentalDragon speech recognition software and smart phrase technology. Any transcriptional errors are unintentional.   Pamella Pertostin Delon Revelo, MD [facility] Pager [phone]. If 7 PM - 7 AM, please contact night-coverage at [email or web address], password [email or web address] 11/05/2013, 7:33 AM  LOS: 6 days

## 2013-11-05 NOTE — Progress Notes (Signed)
CSW received referral for Current Substance Abuse and pt requesting detox and rehab. Pt is sleeping at this time. CSW return to offer SA resources.   Cori RazorJamie Menelik Mcfarren LCSW [phone]

## 2013-11-05 NOTE — Consult Note (Addendum)
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  Patient presented to ED on 6/27 requesting detox from Etoh.  Labs showed ^LFT's, presumed etoh hepatitis, jaundice, hyponatremia and acute renal failure. Also had fevers and diarrhea. He rec'd IVF"s and pressors for hypotension. He required intubation for airway protection.  He is now off the vent and pressors but renal function has slightly worsensed and UOP is poor at about 400 cc per day. Creat was 1.88 on admission, peaked at 2.07, improved to 1.13 and is up to 1.61 today.  BP's are in the 80's - 120's systolic.  No IV contrast or acei/arb have been given. He has rec'd two dose of NSAids, one was IV and one was po. Urine Na's have repeatedly been <20.  Weight is up from 109kg on admission to 132 kg today.    Patient is w/o complaints.  No cp, sob, n/v/d, no abd pain, no jt pain, no confusion  Abx received Vanc IV 6/29 >> current Zosyn 6/29 >> current  Other meds Wellbutrin, Mg, Ca, lovenox, prozac, heparin , ibuprofen, insulin, ativan, metoprolol IV, PPI, KCL, Kphos, fentanyl    Chart review: Nov 2009-- alcoholic hepatitis, adult FTT, AKI improving, liver cirrhosis Child stage III, EColi UTI, gastric ulcers, etoh TME improved, gait disturbance, malnutrition >> d/c'd to SNF Jan 2010-- Headaches, hx encephalopathy, baseline ataxia Feb 2010-- Left hip fracture, w ORIF.  Etoh abuse, ataxis chronic, HTN, low K, low Mg Oct 2010-- voluntary admit for depression, etoh abuse, obesity w hx GB   Past Medical History  Past Medical History  Diagnosis Date  . Hypertension   . Alcohol abuse     Drinks 1-2 large bottle wine per day  .  Depression    Past Surgical History  Past Surgical History  Procedure Laterality Date  . Back surgery    . Knnes time 2    . Minor nailbed repair Right 11/03/2012    Procedure: NAIL DEBRIDEMENT AND BIOPSY RIGHT THUMB NAIL;  Surgeon: Wyn Forsterobert V Sypher Jr., MD;  Location: [facility];  Service: Orthopedics;  Laterality: Right;   Family History History reviewed. No pertinent family history. Social History  reports that he has never smoked. He has never used smokeless tobacco. He reports that he drinks about 2.4 ounces of alcohol per week. He reports that he does not use illicit drugs. Allergies No Known Allergies Home medications Prior to Admission medications   Medication Sig Start Date End Date Taking? Authorizing Provider  buPROPion (WELLBUTRIN SR) 100 MG 12 hr tablet Take 100 mg by mouth daily.    Yes Historical Provider, MD  FLUoxetine (PROZAC) 40 MG capsule Take 40 mg by mouth daily.   Yes Historical Provider, MD  metoprolol succinate (TOPROL-XL) 50 MG 24 hr tablet Take 50 mg by mouth daily. Take with or immediately following a meal.   Yes Historical Provider, MD  OVER THE COUNTER MEDICATION Take 2 tablets by mouth daily. Anti liver pills   Yes Historical Provider, MD  zolpidem (AMBIEN CR) 12.5 MG CR tablet Take 12.5 mg by mouth at bedtime as needed for sleep.   Yes Historical Provider, MD   Liver Function Tests  Recent  Labs Lab 11/03/13 0340 11/04/13 0415 11/05/13 0530  AST 127* 377* 300*  ALT 133* 178* 177*  ALKPHOS 149* 162* 198*  BILITOT 4.0* 4.4* 4.7*  PROT 4.8* 4.9* 4.9*  ALBUMIN 2.1* 1.9* 1.9*   No results found for this basename: LIPASE, AMYLASE,  in the last 168 hours CBC  Recent Labs Lab 11/02/13 0400 11/03/13 0340 11/05/13 0530  WBC 8.1 6.4 7.1  HGB 9.9* 9.5* 10.1*  HCT 28.9* 27.8* 30.6*  MCV 99.3 99.6 103.7*  PLT 107* 116* 157   Basic Metabolic Panel  Recent Labs Lab 11/02/13 0400 11/02/13 1400 11/02/13 2055 11/03/13 0340  11/03/13 1100 11/03/13 2020 11/04/13 0415 11/05/13 0530  NA 126*  --  136* 136* 136* 134* 139 139  K 2.8* 2.9* 2.9* 2.8* 3.6* 3.6* 3.6* 3.7  CL 84*  --  94* 94* 95* 93* 98 97  CO2 13*  --  22 25 25 24 24 24   GLUCOSE 170*  --  183* 166* 111* 69* 92 60*  BUN 16  --  16 15 15 17 17 20   CREATININE 1.31  --  1.21 1.13 1.21 1.56* 1.62* 1.61*  CALCIUM 6.9*  --  6.4* 6.6* 6.4* 6.3* 6.3* 7.3*  PHOS 1.0* 1.4* 1.7* 1.7*  --   --  2.1* 2.2*    Filed Vitals:   11/05/13 1200 11/05/13 1300 11/05/13 1400 11/05/13 1500  BP: 86/48 114/73 93/49 107/67  Pulse: 104 108 104 103  Temp:      TempSrc:      Resp: 13 11 13 16   Height:      Weight:      SpO2: 92% 95% 88% 92%   Exam Obese slow to respond, no distress, alert No rash, cyanosis or gangrene Sclera anicteric, throat clear No JVD Chest is clear bilat RRR no MRG Abd markedly obese, NTND, no palpable ascites 2-3+ bilat LE pitting edema, symmetric Trace UE edema bilat Abd wall edema on R side, mild GU foley draining clear orange colored urine Neuro is gen weak, slowing of thought, speech, motions, no asterixis  UA- large bili, prot 100, +nitrite, small LE, large Hgb, 3-6 wbc, 7-10 rbc per hpf, few bact, few epis UOsm 443 UNa <20 Abd US >> bilat normal kidneys, 11 cm, no hydro; cirrhotic liver by US   Assessment/Rec: 1 Acute renal failure- pt is oliguric with low urine Na in setting of gross volume overload and advanced long-term alcoholic cirrhosis with jaundice and ^LFT's.  Clinically this is consistent with hepatorenal syndrome.  Prognosis is guarded. Sometimes urine flow and renal function can be improved by use of midodrine/octretide/albumin, or levophed/albumin if in the ICU.  Would recommend resuming pressors, keep SBP over 115-120, give albumin IV x 48hrs, stop beta-blockers.  He is not a good candidate for dialysis due to severe comorbidities.  This is a serious condition, I have d/w patient.  Would consider involving palliative  care.  2 Cirrhosis 3 Etoh abuse 4 Obesity 5 Hypotension 6 Fevers- on admission, resolved. Cx's were negative.   Vinson Moselleob Kiyoko Mcguirt MD (pgr) [phone]    ([phone] 11/05/2013, 4:01 PM

## 2013-11-06 LAB — CBC
HEMATOCRIT: 30.2 % — AB (ref 39.0–52.0)
Hemoglobin: 9.8 g/dL — ABNORMAL LOW (ref 13.0–17.0)
MCH: 33.8 pg (ref 26.0–34.0)
MCHC: 32.5 g/dL (ref 30.0–36.0)
MCV: 104.1 fL — AB (ref 78.0–100.0)
PLATELETS: 148 10*3/uL — AB (ref 150–400)
RBC: 2.9 MIL/uL — AB (ref 4.22–5.81)
RDW: 16.4 % — AB (ref 11.5–15.5)
WBC: 5.8 10*3/uL (ref 4.0–10.5)

## 2013-11-06 LAB — COMPREHENSIVE METABOLIC PANEL
ALT: 133 U/L — ABNORMAL HIGH (ref 0–53)
ANION GAP: 20 — AB (ref 5–15)
AST: 182 U/L — AB (ref 0–37)
Albumin: 2.2 g/dL — ABNORMAL LOW (ref 3.5–5.2)
Alkaline Phosphatase: 201 U/L — ABNORMAL HIGH (ref 39–117)
BUN: 17 mg/dL (ref 6–23)
CALCIUM: 7.6 mg/dL — AB (ref 8.4–10.5)
CO2: 22 mEq/L (ref 19–32)
Chloride: 95 mEq/L — ABNORMAL LOW (ref 96–112)
Creatinine, Ser: 1.21 mg/dL (ref 0.50–1.35)
GFR calc Af Amer: 75 mL/min — ABNORMAL LOW (ref >90)
GFR, EST NON AFRICAN AMERICAN: 65 mL/min — AB (ref >90)
Glucose, Bld: 61 mg/dL — ABNORMAL LOW (ref 70–99)
Potassium: 3.5 mEq/L — ABNORMAL LOW (ref 3.7–5.3)
Sodium: 137 mEq/L (ref 137–147)
Total Bilirubin: 5.1 mg/dL — ABNORMAL HIGH (ref 0.3–1.2)
Total Protein: 4.8 g/dL — ABNORMAL LOW (ref 6.0–8.3)

## 2013-11-06 LAB — MAGNESIUM: Magnesium: 1.5 mg/dL (ref 1.5–2.5)

## 2013-11-06 LAB — PROTIME-INR
INR: 1.38 (ref 0.00–1.49)
PROTHROMBIN TIME: 17 s — AB (ref 11.6–15.2)

## 2013-11-06 LAB — STOOL CULTURE

## 2013-11-06 LAB — GLUCOSE, CAPILLARY
GLUCOSE-CAPILLARY: 57 mg/dL — AB (ref 70–99)
GLUCOSE-CAPILLARY: 78 mg/dL (ref 70–99)
GLUCOSE-CAPILLARY: 114 mg/dL — AB (ref 70–99)
GLUCOSE-CAPILLARY: 126 mg/dL — AB (ref 70–99)
Glucose-Capillary: 112 mg/dL — ABNORMAL HIGH (ref 70–99)

## 2013-11-06 LAB — URINE CULTURE
Colony Count: NO GROWTH
Culture: NO GROWTH

## 2013-11-06 LAB — PHOSPHORUS: Phosphorus: 1.7 mg/dL — ABNORMAL LOW (ref 2.3–4.6)

## 2013-11-06 MED ORDER — MAGNESIUM SULFATE 40 MG/ML IJ SOLN
2.0000 g | Freq: Once | INTRAMUSCULAR | Status: AC
  Administered 2013-11-06: 2 g via INTRAVENOUS
  Filled 2013-11-06: qty 50

## 2013-11-06 MED ORDER — POTASSIUM & SODIUM PHOSPHATES 280-160-250 MG PO PACK
2.0000 | PACK | Freq: Three times a day (TID) | ORAL | Status: AC
  Administered 2013-11-06 (×2): 2 via ORAL
  Filled 2013-11-06 (×2): qty 2

## 2013-11-06 MED ORDER — DEXTROSE 10 % IV SOLN
INTRAVENOUS | Status: DC
  Administered 2013-11-06: 08:00:00 via INTRAVENOUS

## 2013-11-06 NOTE — Progress Notes (Signed)
[facility] Progress Note   Subjective: No new complaints  Filed Vitals:   11/06/13 1400 11/06/13 1500 11/06/13 1600 11/06/13 1700  BP: 116/70 113/59 131/76 123/67  Pulse: 109 107 105 115  Temp:   98.8 F (37.1 C)   TempSrc:   Oral   Resp: 19 18 16 23   Height:      Weight:      SpO2: 95% 94% 96% 98%   Exam: Obese, alert, no distress Jaundiced No JVD  Chest is clear bilat  RRR no MRG  Abd markedly obese, NTND, no palpable ascites  2+ bilat LE pitting edema throughout the legs, symmetric  Trace UE edema bilat  Abd wall edema on R side, mild  GU foley draining clear orange colored urine  Neuro is gen weak, slowing of thought, speech, motions, no asterixis   UA- large bili, prot 100, +nitrite, small LE, large Hgb, 3-6 wbc, 7-10 rbc per hpf, few bact, few epis  UOsm 443  UNa <20  Abd US >> bilat normal kidneys, 11 cm, no hydro; cirrhotic liver by US   Assessment/Rec:  1 Acute renal failure- in setting of long-term advanced alcoholic cirrhosis, with acute hepatitis, jaundice, ascites and volume overload from IVF"s in the setting of low UOP.  Low urine Na.  Up 20kg from a week ago. Suspect hepatorenal syndrome. He has made more urine on pressors/ IV albumin, and creatinine is down today.  Will increase goal BP to 130 for now. This is a serious illness and a sign of advanced and possibly end-stage liver disease. Prognosis overall remains poor.  He is not a dialysis candidate due to severe comorbidities.  Palliative care should be consulted.  2 Cirrhosis  3 Etoh abuse  4 Obesity  5 Hypotension  6 Fevers- on admission, resolved. Cx's were negative.   Plan- increase levo gtt to keep SBP over 130, cont IV albumin, check labs in am    Vinson Moselleob Terea Neubauer MD  pager [phone]    cell [phone]  11/06/2013, 5:47 PM     Recent Labs Lab 11/04/13 0415 11/05/13 0530 11/06/13 0525  NA 139 139 137  K 3.6* 3.7 3.5*  CL 98 97 95*  CO2 24 24 22   GLUCOSE 92 60* 61*  BUN  17 20 17   CREATININE 1.62* 1.61* 1.21  CALCIUM 6.3* 7.3* 7.6*  PHOS 2.1* 2.2* 1.7*    Recent Labs Lab 11/04/13 0415 11/05/13 0530 11/06/13 0525  AST 377* 300* 182*  ALT 178* 177* 133*  ALKPHOS 162* 198* 201*  BILITOT 4.4* 4.7* 5.1*  PROT 4.9* 4.9* 4.8*  ALBUMIN 1.9* 1.9* 2.2*    Recent Labs Lab 11/03/13 0340 11/05/13 0530 11/06/13 0525  WBC 6.4 7.1 5.8  HGB 9.5* 10.1* 9.8*  HCT 27.8* 30.6* 30.2*  MCV 99.6 103.7* 104.1*  PLT 116* 157 148*   . buPROPion  100 mg Oral Daily  . feeding supplement (ENSURE COMPLETE)  237 mL Oral BID BM  . FLUoxetine  40 mg Oral Daily  . heparin subcutaneous  5,000 Units Subcutaneous 3 times per day  . insulin aspart  0-9 Units Subcutaneous TID WC  . piperacillin-tazobactam (ZOSYN)  IV  3.375 g Intravenous 3 times per day  . rifaximin  550 mg Oral BID  . thiamine  100 mg Oral Daily   Or  . thiamine  100 mg Intravenous Daily  . vancomycin  1,250 mg Intravenous Q24H   . dextrose 20 mL/hr at 11/06/13 0751  . norepinephrine (  LEVOPHED) Adult infusion 1 mcg/min (11/06/13 0654)   fentaNYL, LORazepam, ondansetron

## 2013-11-06 NOTE — Progress Notes (Signed)
PROGRESS NOTE  [name]:[id number] DOB: 1955/11/10 DOA: 10/30/2013 PCP: Darnelle Bos, MD  HPI: 58 y.o. male with prior h/o hypertension, DM, alcohol abuse, who presented [facility] 6/27 for ETOH detox. Found to have hyponatremia & metabolic acidosis. Admitted to the medical floor and decompensated with tachycardia, AMS, respiratory failure. Tx to ICU 6/29 requiring intubation / TLC, & precedex.   Assessment/Plan: Acute renal failure - due to hepatorenal syndrome, appreciate nephrology assistance, started on Levophed 7/3, albumin, UOP better overnight and Cr improving.  - 2D echo with good EF Hypoglycemia - due to poor po intake, advanced liver disease - persistently hypoglycemic, AxOx4 but somewhat slow this morning, start D10 at 20 cc/h, hopefully will not impact too much his fluid balance.  Acute pancreatitis, -  no lipase on admission, not clear whether he had symptoms c/w with it.  - monitor for now, no epigastric pain today, no nausea/vomiting Fever without clear infectious source, may be due to pancreatitis, cultures negative, fever curve looks better today  - monitor and consider d/c antibiotics if cultures remain negative Anasarca - patient up 22 liters in the past 4 days, oliguric and with worsening renal function Liver cirrhosis- due to ETOH Diarrhea - C diff negative, monitor.  Acute encephalopathy - due to liver disease, ICU stay, with asterixis this morning, started rifaximin  Anemia - due to chronic ETOH, malnutrition, critical illness, liver disease Thrombocytopenia - due to chronic ETOH, malnutrition, critical illness, liver disease Acute hypoxic respiratory failure - due to withdrawal (inability to protect airway) requiring vent support, stable now and extubated.  Depression - will consider psych evaluation once clinical status improves.  ETOH withdrawal - stable now  SIGNIFICANT EVENTS:  6/27 - admitted for ETOH detox  6/29 - tachycardia (160 bpm) and  irregular respiratory pattern and increased work of breathing, PCCM to see.  6/30 - Hematuria, continues to need pressors  7/01 - Extubated, off pressors, heart rate elevated  7/02 - On 1L [place], more alert/oriented  7/3 - TRH transfer  STUDIES:  6/29 - Abd U/S: Hepatic steatosis consistent with hepatic cirrhosis, portal vein with hepatopetal flow  6/30 - TTE > LVEF 60%, mild concentric LVH, normal valves and RV   LINES / TUBES:  OETT 6/29 >>> 7/01  R IJ 6/29 >>>  R radial arterial line >>> 7/01   CULTURES:  6/27 Staph aureus PCR >> positive (neg MRSA)  6/29 Blood cultures >>>  6/30 Stool culture >>>  7/01 C-diff >>> negative   ANTIBIOTICS:  Vancomycin 6/29 >>>  Zosyn 6/29 >>>    Diet: regular  Fluids: none  DVT Prophylaxis: heparin   Code Status: Full Family Communication: d/w wife at bedside and sister Corrie Dandy over the phone (C [phone], [place] [phone]) Disposition Plan: inpatient  Consultants:  PCCM  Nephrology    HPI/Subjective: - feeling better overall  Objective: Filed Vitals:   11/06/13 0300 11/06/13 0400 11/06/13 0500 11/06/13 0600  BP: 111/56 108/53 129/72 115/66  Pulse: 99 97 102 103  Temp:  98.3 F (36.8 C)    TempSrc:  Oral    Resp: 15 14 19 14   Height:      Weight:      SpO2: 95% 95% 98% 96%    Intake/Output Summary (Last 24 hours) at 11/06/13 0716 Last data filed at 11/06/13 0600  Gross per 24 hour  Intake 1212.13 ml  Output      1 ml  Net 1211.13 ml   [employer]  10/31/13 1213 11/04/13 0610 11/05/13 0400  Weight: 108.9 kg (240 lb 1.3 oz) 129 kg (284 lb 6.3 oz) 132 kg (291 lb 0.1 oz)   Exam:  General:  NAD, morbidly obese male  Cardiovascular: regular rate and rhythm, without MRG  Respiratory: good air movement, clear to auscultation throughout, no wheezing, ronchi or rales  Abdomen: obese, soft, non tender, clinically without ascites  MSK: 2-3+ pitting edema, anasarca  Neuro: non focal  Data Reviewed: Basic Metabolic  Panel:  Recent Labs Lab 11/02/13 0400  11/02/13 2055 11/03/13 0340 11/03/13 1100 11/03/13 2020 11/04/13 0415 11/05/13 0530 11/06/13 0525  NA 126*  --  136* 136* 136* 134* 139 139 137  K 2.8*  < > 2.9* 2.8* 3.6* 3.6* 3.6* 3.7 3.5*  CL 84*  --  94* 94* 95* 93* 98 97 95*  CO2 13*  --  22 25 25 24 24 24 22   GLUCOSE 170*  --  183* 166* 111* 69* 92 60* 61*  BUN 16  --  16 15 15 17 17 20 17   CREATININE 1.31  --  1.21 1.13 1.21 1.56* 1.62* 1.61* 1.21  CALCIUM 6.9*  --  6.4* 6.6* 6.4* 6.3* 6.3* 7.3* 7.6*  MG 2.0  --   --  1.6  --   --  1.8 1.7 1.5  PHOS 1.0*  < > 1.7* 1.7*  --   --  2.1* 2.2* 1.7*  < > = values in this interval not displayed. Liver Function Tests:  Recent Labs Lab 11/02/13 0400 11/03/13 0340 11/04/13 0415 11/05/13 0530 11/06/13 0525  AST 111* 127* 377* 300* 182*  ALT 157* 133* 178* 177* 133*  ALKPHOS 144* 149* 162* 198* 201*  BILITOT 4.1* 4.0* 4.4* 4.7* 5.1*  PROT 5.0* 4.8* 4.9* 4.9* 4.8*  ALBUMIN 2.3* 2.1* 1.9* 1.9* 2.2*    Recent Labs Lab 10/31/13 1655 11/02/13 0400 11/03/13 1654 11/05/13 0530  AMMONIA 28 39 62* 52   CBC:  Recent Labs Lab 11/01/13 0415 11/02/13 0400 11/03/13 0340 11/05/13 0530 11/06/13 0525  WBC 10.4 8.1 6.4 7.1 5.8  HGB 10.7* 9.9* 9.5* 10.1* 9.8*  HCT 31.0* 28.9* 27.8* 30.6* 30.2*  MCV 98.4 99.3 99.6 103.7* 104.1*  PLT 107* 107* 116* 157 148*   Cardiac Enzymes:  Recent Labs Lab 11/01/13 1321  TROPONINI <0.30   CBG:  Recent Labs Lab 11/05/13 0757 11/05/13 0823 11/05/13 1145 11/05/13 1649 11/05/13 2139  GLUCAP 63* 70 70 108* 70    Recent Results (from the past 240 hour(s))  MRSA PCR SCREENING     Status: None   Collection Time    11/01/13  8:31 AM      Result Value Ref Range Status   MRSA by PCR NEGATIVE  NEGATIVE Final   Comment:            The GeneXpert MRSA Assay (FDA     approved for NASAL specimens     only), is one component of a     comprehensive MRSA colonization     surveillance program. It  is not     intended to diagnose MRSA     infection nor to guide or     monitor treatment for     MRSA infections.  CULTURE, BLOOD (ROUTINE X 2)     Status: None   Collection Time    11/01/13  1:49 PM      Result Value Ref Range Status   Specimen Description BLOOD CENTRAL LINE   Final  Special Requests BOTTLES DRAWN AEROBIC AND ANAEROBIC [id number]   Final   Culture  Setup Time     Final   Value: 11/01/2013 22:54     Performed at [facility]   Culture     Final   Value:        BLOOD CULTURE RECEIVED NO GROWTH TO DATE CULTURE WILL BE HELD FOR 5 DAYS BEFORE ISSUING A FINAL NEGATIVE REPORT     Performed at [facility]   Report Status PENDING   Incomplete  CULTURE, BLOOD (ROUTINE X 2)     Status: None   Collection Time    11/01/13  4:34 PM      Result Value Ref Range Status   Specimen Description BLOOD RIGHT ARM   Final   Special Requests BOTTLES DRAWN AEROBIC AND ANAEROBIC 10CC   Final   Culture  Setup Time     Final   Value: 11/01/2013 22:51     Performed at [facility]   Culture     Final   Value:        BLOOD CULTURE RECEIVED NO GROWTH TO DATE CULTURE WILL BE HELD FOR 5 DAYS BEFORE ISSUING A FINAL NEGATIVE REPORT     Performed at [facility]   Report Status PENDING   Incomplete  STOOL CULTURE     Status: None   Collection Time    11/02/13  8:52 PM      Result Value Ref Range Status   Specimen Description STOOL   Final   Special Requests NONE   Final   Culture     Final   Value: NO SUSPICIOUS COLONIES, CONTINUING TO HOLD     Performed at [facility]   Report Status PENDING   Incomplete  CLOSTRIDIUM DIFFICILE BY PCR     Status: None   Collection Time    11/03/13  3:53 AM      Result Value Ref Range Status   C difficile by pcr NEGATIVE  NEGATIVE Final   Comment: [facility]     Studies: Ct Abdomen Pelvis Wo Contrast  11/05/2013   CLINICAL DATA:  persistent fever, renal failure  EXAM: CT ABDOMEN AND PELVIS  WITHOUT CONTRAST  TECHNIQUE: Multidetector CT imaging of the abdomen and pelvis was performed following the standard protocol without IV contrast.  COMPARISON:  None.  FINDINGS: Mild areas of linear density within the lung bases. Trace bilateral effusions right greater than left.  Diffuse low attenuation throughout the liver.  The spleen, adrenals, kidneys are unremarkable.  There is mild inflammatory change within the peripancreatic fat, loss of the normal undulating border of pancreas and mild pancreatic edema. No associated peripancreatic loculated fluid collections. Trace amount of peripancreatic free fluid. Dense bile within the gallbladder. Gallbladder fossa otherwise unremarkable.  Postsurgical changes appreciated involving the stomach which appear to represent prior gastric bypass surgery. Noncontrast evaluation of the bowel otherwise negative. There no aggressive appearing osseous lesions. Areas of spondylosis within the lower lumbar spine.  There is no abdominal aortic aneurysm. Within the limitations of a noncontrast CT no abdominal or pelvic masses or adenopathy.  No abdominal wall hernia.  Left inguinal hernia containing fat and fluid, small  IMPRESSION: CT consistent with pancreatitis. Correlation with pancreatic function tests recommended no evidence of a pancreatic pseudocyst.  Hepatic steatosis.   Electronically Signed   By: Salome HolmesHector  Cooper M.D.   On: 11/05/2013 11:14   [address]  11/04/2013   CLINICAL DATA:  Fever  EXAM: PORTABLE CHEST - 1 VIEW  COMPARISON:  November 02, 2013  FINDINGS: Endotracheal tube and nasogastric tube have been removed. Central catheter tip is in the superior vena cava. No pneumothorax. There is atelectatic change in both lung bases, more on the left than on the right. Elsewhere lungs are clear. Heart size and pulmonary vascularity are normal. No adenopathy.  IMPRESSION: Bilateral lower lobe atelectatic change, slightly more on the left than on the right. No  pneumothorax. No edema or consolidation.   Electronically Signed   By: Bretta Bang M.D.   On: 11/04/2013 10:57    Scheduled Meds: . buPROPion  100 mg Oral Daily  . feeding supplement (ENSURE COMPLETE)  237 mL Oral BID BM  . FLUoxetine  40 mg Oral Daily  . heparin subcutaneous  5,000 Units Subcutaneous 3 times per day  . insulin aspart  0-9 Units Subcutaneous TID WC  . piperacillin-tazobactam (ZOSYN)  IV  3.375 g Intravenous 3 times per day  . rifaximin  550 mg Oral BID  . thiamine  100 mg Oral Daily   Or  . thiamine  100 mg Intravenous Daily  . vancomycin  1,250 mg Intravenous Q24H   Continuous Infusions: . dextrose 5 % and 0.9% NaCl 10 mL/hr at 11/04/13 1015  . norepinephrine (LEVOPHED) Adult infusion 1 mcg/min (11/06/13 0654)    Active Problems:   DIABETES MELLITUS   Morbid obesity   DEPRESSIVE DISORDER NOT ELSEWHERE CLASSIFIED   HYPERTENSION   Acute alcoholic hepatitis   ALCOHOL ABUSE, HX OF   Alcohol withdrawal   Hyponatremia   Elevated liver function tests   Metabolic acidosis   Acute renal failure   Acute respiratory failure   Hypovolemic shock  Time spent: 35  This note has been created with Education officer, environmental. Any transcriptional errors are unintentional.   Pamella Pert, MD [facility] Pager [phone]. If 7 PM - 7 AM, please contact night-coverage at [email or web address], password [facility] 11/06/2013, 7:16 AM  LOS: 7 days

## 2013-11-07 LAB — MAGNESIUM: Magnesium: 1.8 mg/dL (ref 1.5–2.5)

## 2013-11-07 LAB — COMPREHENSIVE METABOLIC PANEL
ALT: 122 U/L — ABNORMAL HIGH (ref 0–53)
AST: 152 U/L — ABNORMAL HIGH (ref 0–37)
Albumin: 2.1 g/dL — ABNORMAL LOW (ref 3.5–5.2)
Alkaline Phosphatase: 237 U/L — ABNORMAL HIGH (ref 39–117)
Anion gap: 20 — ABNORMAL HIGH (ref 5–15)
BUN: 14 mg/dL (ref 6–23)
CO2: 22 meq/L (ref 19–32)
CREATININE: 1.05 mg/dL (ref 0.50–1.35)
Calcium: 7.9 mg/dL — ABNORMAL LOW (ref 8.4–10.5)
Chloride: 95 mEq/L — ABNORMAL LOW (ref 96–112)
GFR, EST AFRICAN AMERICAN: 89 mL/min — AB (ref >90)
GFR, EST NON AFRICAN AMERICAN: 77 mL/min — AB (ref >90)
GLUCOSE: 88 mg/dL (ref 70–99)
Potassium: 3.3 mEq/L — ABNORMAL LOW (ref 3.7–5.3)
SODIUM: 137 meq/L (ref 137–147)
TOTAL PROTEIN: 4.6 g/dL — AB (ref 6.0–8.3)
Total Bilirubin: 5.6 mg/dL — ABNORMAL HIGH (ref 0.3–1.2)

## 2013-11-07 LAB — CULTURE, BLOOD (ROUTINE X 2)
Culture: NO GROWTH
Culture: NO GROWTH

## 2013-11-07 LAB — CBC
HCT: 31.7 % — ABNORMAL LOW (ref 39.0–52.0)
Hemoglobin: 10.4 g/dL — ABNORMAL LOW (ref 13.0–17.0)
MCH: 33.7 pg (ref 26.0–34.0)
MCHC: 32.8 g/dL (ref 30.0–36.0)
MCV: 102.6 fL — ABNORMAL HIGH (ref 78.0–100.0)
PLATELETS: 155 10*3/uL (ref 150–400)
RBC: 3.09 MIL/uL — ABNORMAL LOW (ref 4.22–5.81)
RDW: 16.6 % — ABNORMAL HIGH (ref 11.5–15.5)
WBC: 6.7 10*3/uL (ref 4.0–10.5)

## 2013-11-07 LAB — GLUCOSE, CAPILLARY
Glucose-Capillary: 77 mg/dL (ref 70–99)
Glucose-Capillary: 106 mg/dL — ABNORMAL HIGH (ref 70–99)
Glucose-Capillary: 132 mg/dL — ABNORMAL HIGH (ref 70–99)
Glucose-Capillary: 160 mg/dL — ABNORMAL HIGH (ref 70–99)

## 2013-11-07 LAB — PHOSPHORUS: Phosphorus: 1.7 mg/dL — ABNORMAL LOW (ref 2.3–4.6)

## 2013-11-07 LAB — VANCOMYCIN, TROUGH: Vancomycin Tr: 11.4 ug/mL (ref 10.0–20.0)

## 2013-11-07 MED ORDER — POTASSIUM CHLORIDE CRYS ER 20 MEQ PO TBCR
40.0000 meq | EXTENDED_RELEASE_TABLET | Freq: Once | ORAL | Status: AC
  Administered 2013-11-07: 40 meq via ORAL
  Filled 2013-11-07: qty 2

## 2013-11-07 MED ORDER — MIDODRINE HCL 5 MG PO TABS
10.0000 mg | ORAL_TABLET | Freq: Three times a day (TID) | ORAL | Status: DC
  Administered 2013-11-07 – 2013-11-10 (×10): 10 mg via ORAL
  Filled 2013-11-07 (×14): qty 2

## 2013-11-07 MED ORDER — VANCOMYCIN HCL 1000 MG IV SOLR
1000.0000 mg | Freq: Two times a day (BID) | INTRAVENOUS | Status: DC
  Administered 2013-11-07 – 2013-11-08 (×2): 1000 mg via INTRAVENOUS
  Filled 2013-11-07 (×2): qty 1000

## 2013-11-07 MED ORDER — VANCOMYCIN HCL IN DEXTROSE 1-5 GM/200ML-% IV SOLN: 1000.0000 mg | Freq: Two times a day (BID) | INTRAVENOUS | Status: DC

## 2013-11-07 MED ORDER — FUROSEMIDE 10 MG/ML IJ SOLN
20.0000 mg | Freq: Three times a day (TID) | INTRAMUSCULAR | Status: DC
  Administered 2013-11-07 – 2013-11-08 (×3): 20 mg via INTRAVENOUS
  Filled 2013-11-07 (×3): qty 2

## 2013-11-07 MED ORDER — POTASSIUM CHLORIDE CRYS ER 20 MEQ PO TBCR
30.0000 meq | EXTENDED_RELEASE_TABLET | Freq: Two times a day (BID) | ORAL | Status: DC
  Administered 2013-11-07 – 2013-11-08 (×3): 30 meq via ORAL
  Filled 2013-11-07 (×6): qty 1

## 2013-11-07 NOTE — Progress Notes (Signed)
PROGRESS NOTE  Daralene MilchDavid C Finger UJW:[id number]:[id number] DOB: November 30, 1955 DOA: 10/30/2013 PCP: Darnelle BosSBORNE,JAMES CHARLES, MD  HPI: 58 y.o. male with prior h/o hypertension, DM, alcohol abuse, who presented [facility] 6/27 for ETOH detox. Found to have hyponatremia & metabolic acidosis. Admitted to the medical floor and decompensated with tachycardia, AMS, respiratory failure. Tx to ICU 6/29 requiring intubation / TLC, & precedex.   Assessment/Plan: Acute renal failure - due to hepatorenal syndrome, appreciate nephrology assistance, started on Levophed 7/3, albumin, UOP better overnight and Cr improving.  - 2D echo with good EF - add fluid restriction 1500 cc today since patient with 3 cans of coke, cup of coffee and pitcher of water to the bedside this morning - weight continues to go up to 295.  - ?need for Lasix today, defer to nephrology  - will approach family today about palliative care consult tomorrow.  Hypoglycemia - due to poor po intake, advanced liver disease - persistently hypoglycemic, AxOx4 but somewhat slow this morning, start D10 at 20 cc/h, hopefully will not impact too much his fluid balance.  Acute pancreatitis - monitor for now, no epigastric pain today, no nausea/vomiting Fever without clear infectious source, may be due to pancreatitis, cultures negative, fever curve looks better today  - monitor and consider d/c antibiotics Monday if cultures remain negative Anasarca - trying to diurese Liver cirrhosis- due to ETOH Diarrhea - C diff negative, monitor.  Acute encephalopathy - due to liver disease, ICU stay, with asterixis this morning, started rifaximin  Anemia - due to chronic ETOH, malnutrition, critical illness, liver disease Thrombocytopenia - due to chronic ETOH, malnutrition, critical illness, liver disease Acute hypoxic respiratory failure - due to withdrawal (inability to protect airway) requiring vent support, stable now and extubated.  Depression - will consider psych evaluation  once clinical status improves.  ETOH withdrawal - stable now  SIGNIFICANT EVENTS:  6/27 - admitted for ETOH detox  6/29 - tachycardia (160 bpm) and irregular respiratory pattern and increased work of breathing, PCCM to see.  6/30 - Hematuria, continues to need pressors  7/01 - Extubated, off pressors, heart rate elevated  7/02 - On 1L [place], more alert/oriented  7/3 - [facility] transfer  STUDIES:  6/29 - Abd U/S: Hepatic steatosis consistent with hepatic cirrhosis, portal vein with hepatopetal flow  6/30 - TTE > LVEF 60%, mild concentric LVH, normal valves and RV   LINES / TUBES:  OETT 6/29 >>> 7/01  R IJ 6/29 >>>  R radial arterial line >>> 7/01   CULTURES:  6/27 Staph aureus PCR >> positive (neg MRSA)  6/29 Blood cultures >>>  6/30 Stool culture >>>  7/01 C-diff >>> negative   ANTIBIOTICS:  Vancomycin 6/29 >>>  Zosyn 6/29 >>>    Diet: regular  Fluids: none  DVT Prophylaxis: heparin   Code Status: Full Family Communication: d/w wife at bedside and sister Corrie DandyMary over the phone (C [phone], Rexene EdisonH [phone]) Disposition Plan: inpatient  Consultants:  PCCM  Nephrology    HPI/Subjective: - feels better, no complaints, no chest pain, breathing comfortable  Objective: Filed Vitals:   11/07/13 0400 11/07/13 0500 11/07/13 0600 11/07/13 0700  BP: 138/79 126/69 135/75 132/77  Pulse: 104 103 100 105  Temp: 98.5 F (36.9 C)     TempSrc: Oral     Resp: 13 17 8 11   Height:      Weight: 134.2 kg (295 lb 13.7 oz)     SpO2: 97% 95% 96% 97%    Intake/Output Summary (Last  24 hours) at 11/07/13 0741 Last data filed at 11/07/13 0700  Gross per 24 hour  Intake 2107.93 ml  Output   1155 ml  Net 952.93 ml   Filed Weights   11/04/13 0610 11/05/13 0400 11/07/13 0400  Weight: 129 kg (284 lb 6.3 oz) 132 kg (291 lb 0.1 oz) 134.2 kg (295 lb 13.7 oz)   Exam:  General:  NAD, morbidly obese male  Cardiovascular: regular rate and rhythm, without MRG  Respiratory: good air movement,  clear to auscultation throughout, no wheezing, ronchi or rales  Abdomen: obese, soft, non tender, clinically without ascites  MSK: 2-3+ pitting edema, anasarca  Neuro: non focal  Data Reviewed: Basic Metabolic Panel:  Recent Labs Lab 11/03/13 0340  11/03/13 2020 11/04/13 0415 11/05/13 0530 11/06/13 0525 11/07/13 0444  NA 136*  < > 134* 139 139 137 137  K 2.8*  < > 3.6* 3.6* 3.7 3.5* 3.3*  CL 94*  < > 93* 98 97 95* 95*  CO2 25  < > 24 24 24 22 22   GLUCOSE 166*  < > 69* 92 60* 61* 88  BUN 15  < > 17 17 20 17 14   CREATININE 1.13  < > 1.56* 1.62* 1.61* 1.21 1.05  CALCIUM 6.6*  < > 6.3* 6.3* 7.3* 7.6* 7.9*  MG 1.6  --   --  1.8 1.7 1.5 1.8  PHOS 1.7*  --   --  2.1* 2.2* 1.7* 1.7*  < > = values in this interval not displayed. Liver Function Tests:  Recent Labs Lab 11/03/13 0340 11/04/13 0415 11/05/13 0530 11/06/13 0525 11/07/13 0444  AST 127* 377* 300* 182* 152*  ALT 133* 178* 177* 133* 122*  ALKPHOS 149* 162* 198* 201* 237*  BILITOT 4.0* 4.4* 4.7* 5.1* 5.6*  PROT 4.8* 4.9* 4.9* 4.8* 4.6*  ALBUMIN 2.1* 1.9* 1.9* 2.2* 2.1*    Recent Labs Lab 10/31/13 1655 11/02/13 0400 11/03/13 1654 11/05/13 0530  AMMONIA 28 39 62* 52   CBC:  Recent Labs Lab 11/02/13 0400 11/03/13 0340 11/05/13 0530 11/06/13 0525 11/07/13 0444  WBC 8.1 6.4 7.1 5.8 6.7  HGB 9.9* 9.5* 10.1* 9.8* 10.4*  HCT 28.9* 27.8* 30.6* 30.2* 31.7*  MCV 99.3 99.6 103.7* 104.1* 102.6*  PLT 107* 116* 157 148* 155   Cardiac Enzymes:  Recent Labs Lab 11/01/13 1321  TROPONINI <0.30   CBG:  Recent Labs Lab 11/06/13 0728 11/06/13 0804 11/06/13 1212 11/06/13 1628 11/06/13 2051  GLUCAP 57* 78 114* 112* 126*    Recent Results (from the past 240 hour(s))  MRSA PCR SCREENING     Status: None   Collection Time    11/01/13  8:31 AM      Result Value Ref Range Status   MRSA by PCR NEGATIVE  NEGATIVE Final   Comment:            The GeneXpert MRSA Assay (FDA     approved for NASAL specimens       only), is one component of a     comprehensive MRSA colonization     surveillance program. It is not     intended to diagnose MRSA     infection nor to guide or     monitor treatment for     MRSA infections.  CULTURE, BLOOD (ROUTINE X 2)     Status: None   Collection Time    11/01/13  1:49 PM      Result Value Ref Range Status   Specimen  Description BLOOD CENTRAL LINE   Final   Special Requests BOTTLES DRAWN AEROBIC AND ANAEROBIC [facility]   Final   Culture  Setup Time     Final   Value: 11/01/2013 22:54     Performed at [facility]   Culture     Final   Value:        BLOOD CULTURE RECEIVED NO GROWTH TO DATE CULTURE WILL BE HELD FOR 5 DAYS BEFORE ISSUING A FINAL NEGATIVE REPORT     Performed at [facility]   Report Status PENDING   Incomplete  CULTURE, BLOOD (ROUTINE X 2)     Status: None   Collection Time    11/01/13  4:34 PM      Result Value Ref Range Status   Specimen Description BLOOD RIGHT ARM   Final   Special Requests BOTTLES DRAWN AEROBIC AND ANAEROBIC 10CC   Final   Culture  Setup Time     Final   Value: 11/01/2013 22:51     Performed at [facility]   Culture     Final   Value:        BLOOD CULTURE RECEIVED NO GROWTH TO DATE CULTURE WILL BE HELD FOR 5 DAYS BEFORE ISSUING A FINAL NEGATIVE REPORT     Performed at [facility]   Report Status PENDING   Incomplete  STOOL CULTURE     Status: None   Collection Time    11/02/13  8:52 PM      Result Value Ref Range Status   Specimen Description STOOL   Final   Special Requests NONE   Final   Culture     Final   Value: NO SALMONELLA, SHIGELLA, CAMPYLOBACTER, YERSINIA, OR E.COLI 0157:H7 ISOLATED     Performed at [facility]   Report Status 11/06/2013 FINAL   Final  CLOSTRIDIUM DIFFICILE BY PCR     Status: None   Collection Time    11/03/13  3:53 AM      Result Value Ref Range Status   C difficile by pcr NEGATIVE  NEGATIVE Final   Comment: Performed at [facility]   URINE CULTURE     Status: None   Collection Time    11/05/13  8:00 AM      Result Value Ref Range Status   Specimen Description URINE, RANDOM   Final   Special Requests NONE   Final   Culture  Setup Time     Final   Value: 11/05/2013 10:39     Performed at [facility]     Final   Value: NO GROWTH     Performed at [facility]   Culture     Final   Value: NO GROWTH     Performed at [facility]   Report Status 11/06/2013 FINAL   Final     Studies: Ct Abdomen Pelvis Wo Contrast  11/05/2013   CLINICAL DATA:  persistent fever, renal failure  EXAM: CT ABDOMEN AND PELVIS WITHOUT CONTRAST  TECHNIQUE: Multidetector CT imaging of the abdomen and pelvis was performed following the standard protocol without IV contrast.  COMPARISON:  None.  FINDINGS: Mild areas of linear density within the lung bases. Trace bilateral effusions right greater than left.  Diffuse low attenuation throughout the liver.  The spleen, adrenals, kidneys are unremarkable.  There is mild inflammatory change within the peripancreatic fat, loss of the normal undulating border of pancreas and  mild pancreatic edema. No associated peripancreatic loculated fluid collections. Trace amount of peripancreatic free fluid. Dense bile within the gallbladder. Gallbladder fossa otherwise unremarkable.  Postsurgical changes appreciated involving the stomach which appear to represent prior gastric bypass surgery. Noncontrast evaluation of the bowel otherwise negative. There no aggressive appearing osseous lesions. Areas of spondylosis within the lower lumbar spine.  There is no abdominal aortic aneurysm. Within the limitations of a noncontrast CT no abdominal or pelvic masses or adenopathy.  No abdominal wall hernia.  Left inguinal hernia containing fat and fluid, small  IMPRESSION: CT consistent with pancreatitis. Correlation with pancreatic function tests recommended no evidence of a pancreatic pseudocyst.   Hepatic steatosis.   Electronically Signed   By: Salome HolmesHector  Cooper M.D.   On: 11/05/2013 11:14    Scheduled Meds: . buPROPion  100 mg Oral Daily  . feeding supplement (ENSURE COMPLETE)  237 mL Oral BID BM  . FLUoxetine  40 mg Oral Daily  . heparin subcutaneous  5,000 Units Subcutaneous 3 times per day  . insulin aspart  0-9 Units Subcutaneous TID WC  . midodrine  10 mg Oral TID WC  . piperacillin-tazobactam (ZOSYN)  IV  3.375 g Intravenous 3 times per day  . rifaximin  550 mg Oral BID  . thiamine  100 mg Oral Daily   Or  . thiamine  100 mg Intravenous Daily  . vancomycin  1,250 mg Intravenous Q24H   Continuous Infusions: . dextrose 20 mL/hr at 11/06/13 0751  . norepinephrine (LEVOPHED) Adult infusion 2 mcg/min (11/07/13 0543)    Active Problems:   DIABETES MELLITUS   Morbid obesity   DEPRESSIVE DISORDER NOT ELSEWHERE CLASSIFIED   HYPERTENSION   Acute alcoholic hepatitis   ALCOHOL ABUSE, HX OF   Alcohol withdrawal   Hyponatremia   Elevated liver function tests   Metabolic acidosis   Acute renal failure   Acute respiratory failure   Hypovolemic shock  Time spent: 25  This note has been created with Education officer, environmentalDragon speech recognition software and smart phrase technology. Any transcriptional errors are unintentional.   Pamella Pertostin Gherghe, MD [facility] Pager [phone]. If 7 PM - 7 AM, please contact night-coverage at [email or web address], password [email or web address] 11/07/2013, 7:41 AM  LOS: 8 days

## 2013-11-07 NOTE — Progress Notes (Signed)
ANTIBIOTIC CONSULT NOTE - FOLLOW UP  Pharmacy Consult for Vancomycin / Zosyn Indication: sepsis  No Known Allergies  Patient Measurements: Height: 5\' 11"  (180.3 cm) Weight: 295 lb 13.7 oz (134.2 kg) IBW/kg (Calculated) : 75.3  Vital Signs: Temp: 98.9 F (37.2 C) (07/05 2000) Temp src: Oral (07/05 2000) BP: 122/87 mmHg (07/05 2100) Pulse Rate: 92 (07/05 2100) Intake/Output from previous day: 07/04 0701 - 07/05 0700 In: 2107.9 [P.O.:1080; I.V.:627.9; IV Piggyback:400] Out: 1155 [Urine:1155] Intake/Output from this shift: Total I/O In: 55 [I.V.:55] Out: 30 [Urine:30]  Labs:  Recent Labs  11/05/13 0530 11/05/13 1508 11/06/13 0525 11/07/13 0444  WBC 7.1  --  5.8 6.7  HGB 10.1*  --  9.8* 10.4*  PLT 157  --  148* 155  LABCREA  --  207.63  --   --   CREATININE 1.61*  --  1.21 1.05   Estimated Creatinine Clearance: 108.6 ml/min (by C-G formula based on Cr of 1.05).  Recent Labs  11/07/13 2003  VANCOTROUGH 11.4     Microbiology: Recent Results (from the past 720 hour(s))  MRSA PCR SCREENING     Status: None   Collection Time    11/01/13  8:31 AM      Result Value Ref Range Status   MRSA by PCR NEGATIVE  NEGATIVE Final   Comment:            The GeneXpert MRSA Assay (FDA     approved for NASAL specimens     only), is one component of a     comprehensive MRSA colonization     surveillance program. It is not     intended to diagnose MRSA     infection nor to guide or     monitor treatment for     MRSA infections.  CULTURE, BLOOD (ROUTINE X 2)     Status: None   Collection Time    11/01/13  1:49 PM      Result Value Ref Range Status   Specimen Description BLOOD CENTRAL LINE   Final   Special Requests BOTTLES DRAWN AEROBIC AND ANAEROBIC [facility]   Final   Culture  Setup Time     Final   Value: 11/01/2013 22:54     [facility]   Culture     Final   Value: NO GROWTH 5 DAYS     [facility]   Report Status 11/07/2013  FINAL   Final  CULTURE, BLOOD (ROUTINE X 2)     Status: None   Collection Time    11/01/13  4:34 PM      Result Value Ref Range Status   Specimen Description BLOOD RIGHT ARM   Final   Special Requests BOTTLES DRAWN AEROBIC AND ANAEROBIC 10CC   Final   Culture  Setup Time     Final   Value: 11/01/2013 22:51     [facility]   Culture     Final   Value: NO GROWTH 5 DAYS     [facility]   Report Status 11/07/2013 FINAL   Final  STOOL CULTURE     Status: None   Collection Time    11/02/13  8:52 PM      Result Value Ref Range Status   Specimen Description STOOL   Final   Special Requests NONE   Final   Culture     Final   Value: NO SALMONELLA, SHIGELLA, CAMPYLOBACTER,  YERSINIA, OR E.COLI 0157:H7 ISOLATED     [facility]   Report Status 11/06/2013 FINAL   Final  CLOSTRIDIUM DIFFICILE BY PCR     Status: None   Collection Time    11/03/13  3:53 AM      Result Value Ref Range Status   C difficile by pcr NEGATIVE  NEGATIVE Final   Comment: [facility]  URINE CULTURE     Status: None   Collection Time    11/05/13  8:00 AM      Result Value Ref Range Status   Specimen Description URINE, RANDOM   Final   Special Requests NONE   Final   Culture  Setup Time     Final   Value: 11/05/2013 10:39     [facility]formed at [facility]   Colony Count     Final   Value: NO GROWTH     [facility]formed at [facility]   Culture     Final   Value: NO GROWTH     [facility] at [facility]   Report Status 11/06/2013 FINAL   Final    Assessment: 58 y/o M admitted for EtOH detoxification and decompensated with hypotension tachycardia, respiratory failure, leukocytosis; lactate was WNL.  Empiric vancomycin and Zosyn started for r/o sepsis, pharmacy dosing assistance requested.  AKI present with elevated SCr.  Anti-infectives: Day #7 6/29 >> Zosyn >> 6/29 >> Vancomycin >>   Labs / Vitals: Tmax:  99 WBCs: improved to WNL Renal: AKI SCr improving daily, CrCl 108CG, 79N. UOP improving  Microbiology: 6/29 blood x2: NGF 6/29 MRSA PCR: neg 7/1 CDiff PCR: neg 7/1 Stool: NGF 7/3 Urine:NGF  Drug level / dose changes info: 6/30: Vanco increased to 1g q12h based upon improving SCr. 7/1: VT = 13.4 on 1g q12h after 2g load, no change since SCr rising 7/2: VT = 18.2 on 1g q12h - reduced to 1250 q24h since SCr rising. 7/5: VT at 2100 = 11.4 on 1250mg  q24h, Scr has been returning towards normal x 2 days (today Scr = 1.05)  Goal of Therapy:  Zosyn dose per renal function Vancomycin trough 15-20 mcg/ml  Plan:   Change to Vancomycin 1000mg  IV q12h  Continue Zosyn 3.375gm IV q8h (4hr extended infusions)  Follow up plans for dc antibiotics on 7/6  Terrilee FilesLeann Clarissa Laird, PharmD  11/07/2013  9:22 PM

## 2013-11-07 NOTE — Progress Notes (Signed)
ANTIBIOTIC CONSULT NOTE - FOLLOW UP  Pharmacy Consult for Vancomycin / Zosyn Indication: sepsis  No Known Allergies  Patient Measurements: Height: 5\' 11"  (180.3 cm) Weight: 295 lb 13.7 oz (134.2 kg) IBW/kg (Calculated) : 75.3  Vital Signs: Temp: 99 F (37.2 C) (07/05 0800) Temp src: Oral (07/05 0800) BP: 133/70 mmHg (07/05 1100) Pulse Rate: 109 (07/05 1100) Intake/Output from previous day: 07/04 0701 - 07/05 0700 In: 2107.9 [P.O.:1080; I.V.:627.9; IV Piggyback:400] Out: 1155 [Urine:1155] Intake/Output from this shift: Total I/O In: 460 [P.O.:350; I.V.:110] Out: 225 [Urine:225]  Labs:  Recent Labs  11/05/13 0530 11/05/13 1508 11/06/13 0525 11/07/13 0444  WBC 7.1  --  5.8 6.7  HGB 10.1*  --  9.8* 10.4*  PLT 157  --  148* 155  LABCREA  --  207.63  --   --   CREATININE 1.61*  --  1.21 1.05   Estimated Creatinine Clearance: 108.6 ml/min (by C-G formula based on Cr of 1.05). No results found for this basename: VANCOTROUGH, Leodis BinetVANCOPEAK, VANCORANDOM, GENTTROUGH, GENTPEAK, GENTRANDOM, TOBRATROUGH, TOBRAPEAK, TOBRARND, AMIKACINPEAK, AMIKACINTROU, AMIKACIN,  in the last 72 hours   Microbiology: Recent Results (from the past 720 hour(s))  MRSA PCR SCREENING     Status: None   Collection Time    11/01/13  8:31 AM      Result Value Ref Range Status   MRSA by PCR NEGATIVE  NEGATIVE Final   Comment:            The GeneXpert MRSA Assay (FDA     approved for NASAL specimens     only), is one component of a     comprehensive MRSA colonization     surveillance program. It is not     intended to diagnose MRSA     infection nor to guide or     monitor treatment for     MRSA infections.  CULTURE, BLOOD (ROUTINE X 2)     Status: None   Collection Time    11/01/13  1:49 PM      Result Value Ref Range Status   Specimen Description BLOOD CENTRAL LINE   Final   Special Requests BOTTLES DRAWN AEROBIC AND ANAEROBIC [facility]   Final   Culture  Setup Time     Final   Value: 11/01/2013  22:54     Performed at [facility]   Culture     Final   Value: NO GROWTH 5 DAYS     [facility]formed at [facility]   Report Status 11/07/2013 FINAL   Final  CULTURE, BLOOD (ROUTINE X 2)     Status: None   Collection Time    11/01/13  4:34 PM      Result Value Ref Range Status   Specimen Description BLOOD RIGHT ARM   Final   Special Requests BOTTLES DRAWN AEROBIC AND ANAEROBIC 10CC   Final   Culture  Setup Time     Final   Value: 11/01/2013 22:51     [facility]formed at [facility]   Culture     Final   Value: NO GROWTH 5 DAYS     Performed at [facility]   Report Status 11/07/2013 FINAL   Final  STOOL CULTURE     Status: None   Collection Time    11/02/13  8:52 PM      Result Value Ref Range Status   Specimen Description STOOL   Final   Special Requests NONE   Final  Culture     Final   Value: NO SALMONELLA, SHIGELLA, CAMPYLOBACTER, YERSINIA, OR E.COLI 0157:H7 ISOLATED     [facility]formed at [facility]   Report Status 11/06/2013 FINAL   Final  CLOSTRIDIUM DIFFICILE BY PCR     Status: None   Collection Time    11/03/13  3:53 AM      Result Value Ref Range Status   C difficile by pcr NEGATIVE  NEGATIVE Final   Comment: Performed [facility]  URINE CULTURE     Status: None   Collection Time    11/05/13  8:00 AM      Result Value Ref Range Status   Specimen Description URINE, RANDOM   Final   Special Requests NONE   Final   Culture  Setup Time     Final   Value: 11/05/2013 10:39     [facility]formed at [facility]   Colony Count     Final   Value: NO GROWTH     [facility]formed at [facility]   Culture     Final   Value: NO GROWTH     [facility]formed at [facility]   Report Status 11/06/2013 FINAL   Final    Assessment: 58 y/o M admitted for EtOH detoxification and decompensated with hypotension tachycardia, respiratory failure, leukocytosis; lactate was WNL.  Empiric vancomycin and Zosyn started for r/o sepsis, pharmacy  dosing assistance requested.  AKI present with elevated SCr.  Anti-infectives: Day #7 6/29 >> Zosyn >> 6/29 >> Vancomycin >>   Labs / Vitals: Tmax: 99 WBCs: improved to WNL Renal: AKI SCr improving daily, CrCl 100CG, 78N. UOP improving  Microbiology: 6/29 blood x2: NGF 6/29 MRSA PCR: neg 7/1 CDiff PCR: neg 7/1 Stool: NGF 7/3 Urine:NGF  Drug level / dose changes info: 6/30: Vanco increased to 1g q12h based upon improving SCr. 7/1: VT = 13.4 on 1g q12h after 2g load, no change since SCr rising 7/2: VT = 18.2 on 1g q12h - reduced to 1250 q24h since SCr rising. 7/5: VT at 2100 = ____ on 1250mg  q24h  Goal of Therapy:  Zosyn dose per renal function Vancomycin trough 15-20 mcg/ml  Plan:   Continue Vancomycin 1250mg  IV q24h  Check vancomycin trough tonight at 21:00  Continue Zosyn 3.375gm IV q8h (4hr extended infusions)  Follow up plans for dc antibiotics on 7/6  Loralee PacasErin Hae Ahlers, PharmD, BCPS Pager: [phone] 11/07/2013  11:30 AM

## 2013-11-07 NOTE — Progress Notes (Addendum)
  [facility] Progress Note   Subjective: No new complaints. 1155 UOP yesterday.   Filed Vitals:   11/07/13 1825 11/07/13 1850 11/07/13 1900 11/07/13 2000  BP: 148/78 139/70 147/79 129/76  Pulse: 94 93 95 95  Temp:    98.9 F (37.2 C)  TempSrc:    Oral  Resp: 13 21 16 21   Height:      Weight:      SpO2: 100% 98% 100% 100%   Exam: Obese, alert, no distress Jaundiced No JVD  Chest is clear bilat  RRR no MRG  Abd markedly obese, NTND, no palpable ascites  2+ bilat LE pitting edema bilat LE's Trace UE edema bilat  Abd wall edema on R side GU foley draining clear orange colored urine  Neuro is gen weak, slowing of thought, speech, motions, no asterixis   UA- 1.026, large bili, prot 30, 3-6 wbc, 3-6 rbc  UOsm 443  UNa <20  Abd US >> bilat normal kidneys, 11 cm, no hydro; cirrhotic liver by US   Assessment/Rec:  1 Acute renal failure- in setting of long-term etoh cirrhosis. HRS with low urine Na and oliguria.  Responded to levophed / IV albumin. Have started midodrine, wean him off the levophed. This should be given for 2-4 weeks if he continues to do well then can be stopped.  For anasarca will start IV lasix and po KCl for low K levels.   2 Cirrhosis  3 Etoh abuse  4 Obesity  5 Hypotension  6 Fevers- on admission, resolved. Cx's were negative.   Plan- as above    Vinson Moselleob Harpreet Signore MD  pager [phone]    cell [phone]  11/07/2013, 8:59 PM     Recent Labs Lab 11/05/13 0530 11/06/13 0525 11/07/13 0444  NA 139 137 137  K 3.7 3.5* 3.3*  CL 97 95* 95*  CO2 24 22 22   GLUCOSE 60* 61* 88  BUN 20 17 14   CREATININE 1.61* 1.21 1.05  CALCIUM 7.3* 7.6* 7.9*  PHOS 2.2* 1.7* 1.7*    Recent Labs Lab 11/05/13 0530 11/06/13 0525 11/07/13 0444  AST 300* 182* 152*  ALT 177* 133* 122*  ALKPHOS 198* 201* 237*  BILITOT 4.7* 5.1* 5.6*  PROT 4.9* 4.8* 4.6*  ALBUMIN 1.9* 2.2* 2.1*    Recent Labs Lab 11/05/13 0530 11/06/13 0525 11/07/13 0444  WBC 7.1 5.8  6.7  HGB 10.1* 9.8* 10.4*  HCT 30.6* 30.2* 31.7*  MCV 103.7* 104.1* 102.6*  PLT 157 148* 155   . buPROPion  100 mg Oral Daily  . feeding supplement (ENSURE COMPLETE)  237 mL Oral BID BM  . FLUoxetine  40 mg Oral Daily  . heparin subcutaneous  5,000 Units Subcutaneous 3 times per day  . insulin aspart  0-9 Units Subcutaneous TID WC  . midodrine  10 mg Oral TID WC  . piperacillin-tazobactam (ZOSYN)  IV  3.375 g Intravenous 3 times per day  . rifaximin  550 mg Oral BID  . thiamine  100 mg Oral Daily   Or  . thiamine  100 mg Intravenous Daily  . vancomycin  1,250 mg Intravenous Q24H   . dextrose 20 mL/hr at 11/06/13 0751  . norepinephrine (LEVOPHED) Adult infusion 2 mcg/min (11/07/13 1827)   fentaNYL, LORazepam, ondansetron

## 2013-11-08 LAB — BASIC METABOLIC PANEL
ANION GAP: 16 — AB (ref 5–15)
BUN: 13 mg/dL (ref 6–23)
CHLORIDE: 96 meq/L (ref 96–112)
CO2: 25 meq/L (ref 19–32)
CREATININE: 1.12 mg/dL (ref 0.50–1.35)
Calcium: 8.5 mg/dL (ref 8.4–10.5)
GFR calc Af Amer: 82 mL/min — ABNORMAL LOW (ref >90)
GFR calc non Af Amer: 71 mL/min — ABNORMAL LOW (ref >90)
Glucose, Bld: 103 mg/dL — ABNORMAL HIGH (ref 70–99)
Potassium: 3.5 mEq/L — ABNORMAL LOW (ref 3.7–5.3)
Sodium: 137 mEq/L (ref 137–147)

## 2013-11-08 LAB — GLUCOSE, CAPILLARY
GLUCOSE-CAPILLARY: 129 mg/dL — AB (ref 70–99)
Glucose-Capillary: 115 mg/dL — ABNORMAL HIGH (ref 70–99)
Glucose-Capillary: 118 mg/dL — ABNORMAL HIGH (ref 70–99)
Glucose-Capillary: 125 mg/dL — ABNORMAL HIGH (ref 70–99)

## 2013-11-08 LAB — HEPATIC FUNCTION PANEL
ALT: 111 U/L — AB (ref 0–53)
AST: 155 U/L — ABNORMAL HIGH (ref 0–37)
Albumin: 2 g/dL — ABNORMAL LOW (ref 3.5–5.2)
Alkaline Phosphatase: 257 U/L — ABNORMAL HIGH (ref 39–117)
BILIRUBIN DIRECT: 4.7 mg/dL — AB (ref 0.0–0.3)
BILIRUBIN INDIRECT: 1.1 mg/dL — AB (ref 0.3–0.9)
Total Bilirubin: 5.8 mg/dL — ABNORMAL HIGH (ref 0.3–1.2)
Total Protein: 4.6 g/dL — ABNORMAL LOW (ref 6.0–8.3)

## 2013-11-08 LAB — MAGNESIUM: Magnesium: 1.6 mg/dL (ref 1.5–2.5)

## 2013-11-08 LAB — PHOSPHORUS: Phosphorus: 1.6 mg/dL — ABNORMAL LOW (ref 2.3–4.6)

## 2013-11-08 MED ORDER — SPIRONOLACTONE 25 MG PO TABS
25.0000 mg | ORAL_TABLET | Freq: Two times a day (BID) | ORAL | Status: DC
  Administered 2013-11-08 – 2013-11-10 (×4): 25 mg via ORAL
  Filled 2013-11-08 (×6): qty 1

## 2013-11-08 MED ORDER — FUROSEMIDE 40 MG PO TABS
40.0000 mg | ORAL_TABLET | Freq: Two times a day (BID) | ORAL | Status: DC
Start: 2013-11-08 — End: 2013-11-10
  Administered 2013-11-08 – 2013-11-10 (×4): 40 mg via ORAL
  Filled 2013-11-08 (×6): qty 1

## 2013-11-08 NOTE — Care Management Note (Addendum)
    Page 1 of 2   11/10/2013     1:01:41 PM CARE MANAGEMENT NOTE 11/10/2013  Patient:  [name],[name]   Account Number:  [id number]  Date Initiated:  11/01/2013  Documentation initiated by:  DAVIS,RHONDA  Subjective/Objective Assessment:   etoh abuse, a.fib,metabolic acicdosis/     Action/Plan:   patient remains in sdu [id number] due to new onset of a.fib, and increased agitiation pt now on iv cardizem and precedex.   Anticipated DC Date:  11/10/2013   Anticipated DC Plan:  SKILLED NURSING FACILITY  In-house referral  Clinical Social Worker      DC Planning Services  CM consult      [facility]  [facility]   Choice offered to / List presented to:  NA   DME arranged  NA      DME agency  NA     HH arranged  NA      HH agency  NA   Status of service:  Completed, signed off Medicare Important Message given?  NA - LOS <3 / Initial given by admissions (If response is "NO", the following Medicare IM given date fields will be blank) Date Medicare IM given:  11/10/2013 Medicare IM given by:  [facility]Hisao Doo Date Additional Medicare IM given:   Additional Medicare IM given by:    Discharge Disposition:    Per UR Regulation:  Reviewed for med. necessity/level of care/duration of stay  If discussed at Long Length of Stay Meetings, dates discussed:   11/09/2013    Comments:  11/10/13 Djon Tith RN,BSN NCM [phone] D/[name] SNF.  11/09/13 Demir Titsworth RN,BSN NCM [phone] TRANSFERRED FROM SDU.D/[name] PLAN SNF.  11/08/13 Brylynn Hanssen RN,BSN NCM [phone] FOR PALLIATIVE CONS.D/[name] PLAN SNF.  [id number]/Rhonda Earlene Plateravis, RN, BSN, [place] [phone] Chart Reviewed for discharge and hospital needs. Discharge needs at time of review: None present will follow for needs. Review of patient progress due on [id number].   [id number]/Rhonda Earlene PlaterDavis, RN, BSN, [place] [phone] Chart Reviewed for discharge and hospital needs. Discharge needs at time of review: None present will follow for needs. IM UPDATE NEEDED ON  [id number] IF REMAINS INPATIENT Review of patient progress due on [id number].

## 2013-11-08 NOTE — Progress Notes (Signed)
PROGRESS NOTE  [name]:[id number] DOB: [birth year] DOA: 10/30/2013 PCP: Darnelle Bos, MD  HPI: 58 y.o. male with prior h/o hypertension, DM, alcohol abuse, who presented [facility] 6/27 for ETOH detox. Found to have hyponatremia & metabolic acidosis. Admitted to the medical floor and decompensated with tachycardia, AMS, respiratory failure. Tx to ICU 6/29 requiring intubation / TLC, & precedex.   Assessment/Plan: Acute renal failure - due to hepatorenal syndrome, appreciate nephrology assistance, started on Levophed 7/3, albumin, UOP better overnight and Cr improving.  - 2D echo with good EF - UOP better BP 120s systolic this morning on midodrine alone - diuresing well with low dose Lasix - appreciate Nephrology assistance Hypoglycemia - due to poor po intake, advanced liver disease - persistently hypoglycemic, AxOx4 but somewhat slow, continue D10 at 20 cc/h, hopefully will not impact too much his fluid balance.  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  Acute encephalopathy - due to liver disease, ICU stay, with asterixis this morning, started rifaximin  Anemia - due to chronic ETOH, malnutrition, critical illness, liver disease Thrombocytopenia - due to chronic ETOH, malnutrition, critical illness, liver disease Acute hypoxic respiratory failure - due to withdrawal (inability to protect airway) requiring vent support, stable now and extubated.  Depression - will consider psych evaluation once clinical status improves.  ETOH withdrawal - stable now  SIGNIFICANT EVENTS:  6/27 - admitted for ETOH detox  6/29 - tachycardia (160 bpm) and irregular respiratory pattern and  increased work of breathing, PCCM to see.  6/30 - Hematuria, continues to need pressors  7/01 - Extubated, off pressors, heart rate elevated  7/02 - On 1L [place], more alert/oriented  7/3 - TRH transfer  STUDIES:  6/29 - Abd U/S: Hepatic steatosis consistent with hepatic cirrhosis, portal vein with hepatopetal flow  6/30 - TTE > LVEF 60%, mild concentric LVH, normal valves and RV   LINES / TUBES:  OETT 6/29 >>> 7/01  R IJ 6/29 >>>  R radial arterial line >>> 7/01   CULTURES:  6/27 Staph aureus PCR >> positive (neg MRSA)  6/29 Blood cultures >>>  6/30 Stool culture >>>  7/01 C-diff >>> negative   ANTIBIOTICS:  Vancomycin 6/29 >>>  Zosyn 6/29 >>>    Diet: regular  Fluids: none  DVT Prophylaxis: heparin   Code Status: Full Family Communication: d/w wife at bedside and sister Corrie Dandy over the phone (C [phone], Rexene Edison [phone]) Disposition Plan: inpatient  Consultants:  PCCM  Nephrology    HPI/Subjective: - feels better, no complaints, no chest pain, breathing comfortable  Objective: Filed Vitals:   11/08/13 0600 11/08/13 0700 11/08/13 0800 11/08/13 0932  BP: 100/58 123/78 115/58   Pulse: 101 102 109 136  Temp:   98.1 F (36.7 C)   TempSrc:   Oral   Resp: 21 20 14    Height:      Weight:      SpO2: 96% 97% 98% 97%    Intake/Output Summary (Last 24 hours) at 11/08/13 1043 Last data filed at 11/08/13 0800  Gross per 24 hour  Intake 1796.85 ml  Output   1745 ml  Net  51.85 ml   Filed Weights   11/05/13 0400 11/07/13 0400 11/08/13 0400  Weight: 132 kg (291 lb 0.1 oz) 134.2 kg (295 lb 13.7 oz) 134.8 kg (297 lb 2.9 oz)   Exam:  General:  NAD, morbidly obese male  Cardiovascular: regular rate and rhythm, without MRG  Respiratory: good air movement, clear to auscultation throughout, no wheezing, ronchi or rales  Abdomen: obese, soft, non tender, clinically without ascites  MSK: 2-3+ pitting edema, anasarca  Neuro: non focal  Data Reviewed: Basic Metabolic  Panel:  Recent Labs Lab 11/04/13 0415 11/05/13 0530 11/06/13 0525 11/07/13 0444 11/08/13 0543  NA 139 139 137 137 137  K 3.6* 3.7 3.5* 3.3* 3.5*  CL 98 97 95* 95* 96  CO2 24 24 22 22 25   GLUCOSE 92 60* 61* 88 103*  BUN 17 20 17 14 13   CREATININE 1.62* 1.61* 1.21 1.05 1.12  CALCIUM 6.3* 7.3* 7.6* 7.9* 8.5  MG 1.8 1.7 1.5 1.8 1.6  PHOS 2.1* 2.2* 1.7* 1.7* 1.6*   Liver Function Tests:  Recent Labs Lab 11/03/13 0340 11/04/13 0415 11/05/13 0530 11/06/13 0525 11/07/13 0444  AST 127* 377* 300* 182* 152*  ALT 133* 178* 177* 133* 122*  ALKPHOS 149* 162* 198* 201* 237*  BILITOT 4.0* 4.4* 4.7* 5.1* 5.6*  PROT 4.8* 4.9* 4.9* 4.8* 4.6*  ALBUMIN 2.1* 1.9* 1.9* 2.2* 2.1*    Recent Labs Lab 11/02/13 0400 11/03/13 1654 11/05/13 0530  AMMONIA 39 62* 52   CBC:  Recent Labs Lab 11/02/13 0400 11/03/13 0340 11/05/13 0530 11/06/13 0525 11/07/13 0444  WBC 8.1 6.4 7.1 5.8 6.7  HGB 9.9* 9.5* 10.1* 9.8* 10.4*  HCT 28.9* 27.8* 30.6* 30.2* 31.7*  MCV 99.3 99.6 103.7* 104.1* 102.6*  PLT 107* 116* 157 148* 155   Cardiac Enzymes:  Recent Labs Lab 11/01/13 1321  TROPONINI <0.30   CBG:  Recent Labs Lab 11/07/13 0733 11/07/13 1134 11/07/13 1707 11/07/13 2313 11/08/13 0750  GLUCAP 77 106* 132* 160* 115*    Recent Results (from the past 240 hour(s))  MRSA PCR SCREENING     Status: None   Collection Time    11/01/13  8:31 AM      Result Value Ref Range Status   MRSA by PCR NEGATIVE  NEGATIVE Final   Comment:            The GeneXpert MRSA Assay (FDA     approved for NASAL specimens     only), is one component of a     comprehensive MRSA colonization     surveillance program. It is not     intended to diagnose MRSA     infection nor to guide or     monitor treatment for     MRSA infections.  CULTURE, BLOOD (ROUTINE X 2)     Status: None   Collection Time    11/01/13  1:49 PM      Result Value Ref Range Status   Specimen Description BLOOD CENTRAL LINE   Final     Special Requests BOTTLES DRAWN AEROBIC AND ANAEROBIC [facility]7CC   Final   Culture  Setup Time     Final   Value: 11/01/2013 22:54     Performed at [facility]   Culture     Final   Value: NO GROWTH 5 DAYS     Performed at [facility]   Report Status 11/07/2013 FINAL   Final  CULTURE, BLOOD (ROUTINE X 2)     Status: None   Collection Time    11/01/13  4:34 PM  Result Value Ref Range Status   Specimen Description BLOOD RIGHT ARM   Final   Special Requests BOTTLES DRAWN AEROBIC AND ANAEROBIC 10CC   Final   Culture  Setup Time     Final   Value: 11/01/2013 22:51     Performed at [facility]   Culture     Final   Value: NO GROWTH 5 DAYS     Performed at [facility]   Report Status 11/07/2013 FINAL   Final  STOOL CULTURE     Status: None   Collection Time    11/02/13  8:52 PM      Result Value Ref Range Status   Specimen Description STOOL   Final   Special Requests NONE   Final   Culture     Final   Value: NO SALMONELLA, SHIGELLA, CAMPYLOBACTER, YERSINIA, OR E.COLI 0157:H7 ISOLATED     Performed at [facility]   Report Status 11/06/2013 FINAL   Final  CLOSTRIDIUM DIFFICILE BY PCR     Status: None   Collection Time    11/03/13  3:53 AM      Result Value Ref Range Status   C difficile by pcr NEGATIVE  NEGATIVE Final   Comment: Performed at [facility]  URINE CULTURE     Status: None   Collection Time    11/05/13  8:00 AM      Result Value Ref Range Status   Specimen Description URINE, RANDOM   Final   Special Requests NONE   Final   Culture  Setup Time     Final   Value: 11/05/2013 10:39     Performed at [employer] [facility] Count     Final   Value: NO GROWTH     Performed at [facility]   Culture     Final   Value: NO GROWTH     Performed at [facility]   Report Status 11/06/2013 FINAL   Final     Studies: No results found.  Scheduled Meds: . buPROPion  100 mg Oral Daily  . feeding  supplement (ENSURE COMPLETE)  237 mL Oral BID BM  . FLUoxetine  40 mg Oral Daily  . furosemide  20 mg Intravenous Q8H  . heparin subcutaneous  5,000 Units Subcutaneous 3 times per day  . insulin aspart  0-9 Units Subcutaneous TID WC  . midodrine  10 mg Oral TID WC  . piperacillin-tazobactam (ZOSYN)  IV  3.375 g Intravenous 3 times per day  . potassium chloride  30 mEq Oral BID  . rifaximin  550 mg Oral BID  . thiamine  100 mg Oral Daily   Or  . thiamine  100 mg Intravenous Daily  . vancomycin (VANCOCIN) 1000 mg IVPB  1,000 mg Intravenous Q12H   Continuous Infusions: . dextrose Stopped (11/07/13 2200)  . norepinephrine (LEVOPHED) Adult infusion Stopped (11/07/13 2200)    Active Problems:   DIABETES MELLITUS   Morbid obesity   DEPRESSIVE DISORDER NOT ELSEWHERE CLASSIFIED   HYPERTENSION   Acute alcoholic hepatitis   ALCOHOL ABUSE, HX OF   Alcohol withdrawal   Hyponatremia   Elevated liver function tests   Metabolic acidosis   Acute renal failure   Acute respiratory failure   Hypovolemic shock  Time spent: 25  This note has been created with Education officer, environmental. Any transcriptional errors are unintentional.   Pamella Pert, MD [facility]  Pager [phone]. If 7 PM - 7 AM, please contact night-coverage at [email or web address], password [email or web address] 11/08/2013, 10:43 AM  LOS: 9 days

## 2013-11-08 NOTE — Progress Notes (Addendum)
Clinical Social Work Department CLINICAL SOCIAL WORK PLACEMENT NOTE 11/08/2013  Patient:  [name],[name] C  Account Number:  [id number] Admit date:  10/30/2013  Clinical Social Worker:  Cori RazorJAMIE HAIDINGER, LCSW  Date/time:  11/08/2013 03:29 PM  Clinical Social Work is seeking post-discharge placement for this patient at the following level of care:   SKILLED NURSING   (*CSW will update this form in Epic as items are completed)    11/08/2013  [facility]/family provided with [facility] Department of Clinical Social Work's list of facilities offering this level of care within the geographic area requested by the patient (or if unable, by the patient's family).  11/08/2013  Patient/family informed of their freedom to choose among providers that offer the needed level of care, that participate in Medicare, Medicaid or managed care program needed by the patient, have an available bed and are willing to accept the patient.   11/08/2013  Patient/family informed of [facility]' ownership interest in [facility], as well as of the fact that they are under no obligation to receive care at this facility.  PASARR submitted to EDS on 11/08/2013 PASARR number received on 11/08/2013  FL2 transmitted to all facilities in geographic area requested by pt/family on  11/08/2013 FL2 transmitted to all facilities within larger geographic area on   Patient informed that his/her managed care company has contracts with or will negotiate with  certain facilities, including the following:     Patient/family informed of bed offers received:  11/09/2013 [facility] Physician recommends and patient chooses bed at    [facility]  Patient to be transferred to facility by Family Patient and family notified of transfer on 11/10/13 Name of family member notified:  Sister and wife in room  The following physician request were entered in  Epic:   Additional Comments:  Cori RazorJamie Haidinger LCSW [phone]  Loletta SpecterSuzanna Kidd, MSW, LCSW Clinical Social Work [phone]

## 2013-11-08 NOTE — Progress Notes (Signed)
Clinical Social Work Department BRIEF PSYCHOSOCIAL ASSESSMENT 11/08/2013  Patient:  [name], [name]     Account Number:  [id number]     Admit date:  10/30/2013  Clinical Social Worker:  Lacie Scotts  Date/Time:  11/08/2013 03:13 PM  Referred by:  Physician  Date Referred:  11/08/2013 Referred for  SNF Placement   Other Referral:   Interview type:  Patient Other interview type:    PSYCHOSOCIAL DATA Living Status:  WIFE Admitted from facility:   Level of care:   Primary support name:  Vaughan Basta Primary support relationship to patient:  SPOUSE Degree of support available:   supportive    CURRENT CONCERNS Current Concerns  Post-Acute Placement  Substance Abuse   Other Concerns:    SOCIAL WORK ASSESSMENT / PLAN Pt is a 58 yr old gentleman living with spouse prior to hospitalization. CSW met with pt and spoke with spouse and sister to assist with d/c planning. PN reviewed. PT is recommending SNF placement following hospital d/c. Pt / family are in agreement with this plan. Pt has [employer] which requires prior approval . CSW will assist with this process. S/A was not addressed during this visit but will be addressed prior to d/c to SNF.   Assessment/plan status:  Psychosocial Support/Ongoing Assessment of Needs Other assessment/ plan:   Information/referral to community resources:   Insurance coverage for SNF was reviewed.    PATIENT'S/FAMILY'S RESPONSE TO PLAN OF CARE: Pt / family agree that SNF placement is needed at this time. Pt is weak and deconditioned due to medical issues. " I don't think I would do well at home right now. "  Pt / family appreciate assistance provided CSW.   Werner Lean LCSW [phone]

## 2013-11-08 NOTE — Progress Notes (Signed)
Physical Therapy Treatment Patient Details Name: [name] MilchDavid C Holycross MRN: [id number] DOB: 1955/12/01 Today's Date: 11/08/2013    History of Present Illness 58 yo male with H/O ETOH, depression, hyponatremia, metabolic acidosis,VDRF.    PT Comments    *Sit to stand x 2 trials with min assist, min assist to pivot to recliner. Pt stated he was too fatigued to attempt ambulation. HR 122 with activity, 105 at rest. **  Follow Up Recommendations  SNF;Supervision/Assistance - 24 hour     Equipment Recommendations  None recommended by PT    Recommendations for Other Services       Precautions / Restrictions Precautions Precautions: Fall Precaution Comments: monitor HR    Mobility  Bed Mobility Overal bed mobility: Needs Assistance Bed Mobility: Supine to Sit     Supine to sit: Mod assist     General bed mobility comments: assist for trunk to upright as he tended to lean posterioly.  Transfers Overall transfer level: Needs assistance Equipment used: Rolling walker (2 wheeled) Transfers: Sit to/from Stand Sit to Stand: Min assist Stand pivot transfers: Min assist       General transfer comment: min A for pivot to recliner; sit to stand x 2 trials  Ambulation/Gait             General Gait Details: NT-pt stated he was too fatiued to walk   Stairs            Wheelchair Mobility    Modified Rankin (Stroke Patients Only)       Balance   Sitting-balance support: Bilateral upper extremity supported;Feet supported Sitting balance-Leahy Scale: Fair Sitting balance - Comments: able to sit at midline                            Cognition Arousal/Alertness: Awake/alert Behavior During Therapy: WFL for tasks assessed/performed Overall Cognitive Status: Within Functional Limits for tasks assessed                      Exercises General Exercises - Lower Extremity Ankle Circles/Pumps: AROM;Both;10 reps;Supine    General Comments         Pertinent Vitals/Pain *HR 105 at rest, 122 with activity 0/10 pain SaO2 94% on RA**    Home Living                      Prior Function            PT Goals (current goals can now be found in the care plan section) Acute Rehab PT Goals Patient Stated Goal: get back to normal; pt likes to water ski PT Goal Formulation: With patient Time For Goal Achievement: 11/18/13 Potential to Achieve Goals: Good Progress towards PT goals: Progressing toward goals    Frequency  Min 3X/week    PT Plan Current plan remains appropriate    Co-evaluation             End of Session Equipment Utilized During Treatment: Gait belt Activity Tolerance: Patient limited by fatigue Patient left: in chair;with call bell/phone within reach;with family/visitor present     Time: [id number] PT Time Calculation (min): 18 min  Charges:  $Therapeutic Activity: 8-22 mins                    G Codes:      Tamala SerUhlenberg, Laetitia Schnepf Kistler 11/08/2013, 1:39 PM [phone]

## 2013-11-08 NOTE — Progress Notes (Signed)
Assessment/Rec:  1 Acute renal failure- in setting of long-term etoh cirrhosis. HRS with low urine Na and oliguria. Responded to levophed / IV albumin.  On Midodrine PO and Lasix IV.  Will change to PO Lasix & spironolactone; d/c foley in 1-2 days if UOP OK 2 Cirrhosis  3 Etoh abuse  4 Obesity  5 Hypotension, impropved  Subjective: Interval History: OOB to chair today  Objective: Vital signs in last 24 hours: Temp:  [98.1 F (36.7 C)-98.9 F (37.2 C)] 98.6 F (37 C) (07/06 1200) Pulse Rate:  [25-136] 103 (07/06 1400) Resp:  [12-27] 15 (07/06 1400) BP: (100-150)/(58-110) 130/83 mmHg (07/06 1400) SpO2:  [92 %-100 %] 93 % (07/06 1400) Weight:  [134.8 kg (297 lb 2.9 oz)] 134.8 kg (297 lb 2.9 oz) (07/06 0400) Weight change: 0.6 kg (1 lb 5.2 oz)  Intake/Output from previous day: 07/05 0701 - 07/06 0700 In: 2579.4 [P.O.:1750; I.V.:429.4; IV Piggyback:400] Out: 1495 [Urine:1495] Intake/Output this shift: Total I/O In: 200 [P.O.:200] Out: 460 [Urine:460]  General appearance: alert and cooperative Head: Normocephalic, without obvious abnormality, atraumatic Chest wall: no tenderness Cardio: regular rate and rhythm, S1, S2 normal, no murmur, click, rub or gallop GI: protuberant and obese Extremities: edema 3-4+ yellow urine  Lab Results:  Recent Labs  11/06/13 0525 11/07/13 0444  WBC 5.8 6.7  HGB 9.8* 10.4*  HCT 30.2* 31.7*  PLT 148* 155   BMET:  Recent Labs  11/07/13 0444 11/08/13 0543  NA 137 137  K 3.3* 3.5*  CL 95* 96  CO2 22 25  GLUCOSE 88 103*  BUN 14 13  CREATININE 1.05 1.12  CALCIUM 7.9* 8.5   No results found for this basename: PTH,  in the last 72 hours Iron Studies: No results found for this basename: IRON, TIBC, TRANSFERRIN, FERRITIN,  in the last 72 hours Studies/Results: No results found.  Scheduled: . buPROPion  100 mg Oral Daily  . feeding supplement (ENSURE COMPLETE)  237 mL Oral BID BM  . FLUoxetine  40 mg Oral Daily  . furosemide  20 mg  Intravenous Q8H  . heparin subcutaneous  5,000 Units Subcutaneous 3 times per day  . insulin aspart  0-9 Units Subcutaneous TID WC  . midodrine  10 mg Oral TID WC  . potassium chloride  30 mEq Oral BID  . rifaximin  550 mg Oral BID  . thiamine  100 mg Oral Daily   Or  . thiamine  100 mg Intravenous Daily      LOS: 9 days   Matt Delpizzo C 11/08/2013,2:45 PM

## 2013-11-08 NOTE — Progress Notes (Addendum)
Occupational Therapy Treatment Patient Details Name: [name] MilchDavid C Connon MRN: [id number] DOB: 1955/06/23 Today's Date: 11/08/2013    History of present illness 58 yo male with H/O ETOH, depression, hyponatremia, metabolic acidosis,VDRF.   OT comments  Pt up to chair and did well with stepping around with walker to recliner. HR up to 136 with activity but decreased with rest to 110s.. Nursing made aware. Wife present for session. Educated on theraband exercises and pt practiced X 5 reps each. Encouraged him to perform on his own also.   Follow Up Recommendations  SNF;Supervision/Assistance - 24 hour    Equipment Recommendations  3 in 1 bedside comode    Recommendations for Other Services      Precautions / Restrictions Precautions Precautions: Fall Precaution Comments: monitor HR       Mobility Bed Mobility Overal bed mobility: Needs Assistance Bed Mobility: Supine to Sit     Supine to sit: Min assist     General bed mobility comments: assist for trunk to upright as he tended to lean posteriorly.  Transfers Overall transfer level: Needs assistance Equipment used: Rolling walker (2 wheeled) Transfers: Sit to/from Stand Sit to Stand: Min guard         General transfer comment: pt did well with rising, min guard for safety.    Balance                                   ADL                           Toilet Transfer: Minimal assistance;Stand-pivot;+2 for safety/equipment;Requires wide/bariatric             General ADL Comments: Assisted up to chair with +2 for safety and lines. Pt did very well with standing today as he stood from bed with only min guard assist. Pt with noted BM upon sitting up so when pt stood, assisted with periarea hygiene.  Once in chair, instructed pt on theraband exercise to help with strengthening and activity tolerance. See exercise section. Wife present. Explained AE options for LB self care as wife has been assisting  with this for awhile. pt interested in practicing with AE.       Vision                     Perception     Praxis      Cognition                             Extremity/Trunk Assessment               Exercises Other Exercises Other Exercises: yellow theraband X 5 horizontal abduction, shoulder flexion. Pt fatigues quickly. Min verbal cues and min assist to initiate exercises correctly.   Shoulder Instructions       General Comments      Pertinent Vitals/ Pain       No complaint of pain; HR up to 136 with activity, down to 110s with rest. O2 stable throughout session in upper 90s. HR 117 during theraband exercises.    Home Living  Prior Functioning/Environment              Frequency Min 2X/week     Progress Toward Goals  OT Goals(current goals can now be found in the care plan section)  Progress towards OT goals: Progressing toward goals     Plan Discharge plan remains appropriate    Co-evaluation                 End of Session Equipment Utilized During Treatment: Rolling walker   Activity Tolerance Patient limited by fatigue   Patient Left in chair;with call bell/phone within reach;with family/visitor present   Nurse Communication          Time: [phone] OT Time Calculation (min): 26 min  Charges: OT General Charges $OT Visit: 1 Procedure OT Treatments $Therapeutic Activity: 8-22 mins $Therapeutic Exercise: 8-22 mins  Lennox LaityStone, Jaspreet Hollings [place] [phone] 11/08/2013, 9:46 AM

## 2013-11-09 LAB — COMPREHENSIVE METABOLIC PANEL
ALK PHOS: 288 U/L — AB (ref 39–117)
ALT: 111 U/L — ABNORMAL HIGH (ref 0–53)
ANION GAP: 18 — AB (ref 5–15)
AST: 213 U/L — ABNORMAL HIGH (ref 0–37)
Albumin: 1.8 g/dL — ABNORMAL LOW (ref 3.5–5.2)
BILIRUBIN TOTAL: 6.6 mg/dL — AB (ref 0.3–1.2)
BUN: 14 mg/dL (ref 6–23)
CHLORIDE: 95 meq/L — AB (ref 96–112)
CO2: 26 mEq/L (ref 19–32)
Calcium: 8.4 mg/dL (ref 8.4–10.5)
Creatinine, Ser: 1.18 mg/dL (ref 0.50–1.35)
GFR calc Af Amer: 77 mL/min — ABNORMAL LOW (ref >90)
GFR, EST NON AFRICAN AMERICAN: 67 mL/min — AB (ref >90)
Glucose, Bld: 87 mg/dL (ref 70–99)
Potassium: 3 mEq/L — ABNORMAL LOW (ref 3.7–5.3)
Sodium: 139 mEq/L (ref 137–147)
TOTAL PROTEIN: 4.6 g/dL — AB (ref 6.0–8.3)

## 2013-11-09 LAB — GLUCOSE, CAPILLARY
GLUCOSE-CAPILLARY: 103 mg/dL — AB (ref 70–99)
Glucose-Capillary: 90 mg/dL (ref 70–99)
Glucose-Capillary: 151 mg/dL — ABNORMAL HIGH (ref 70–99)
Glucose-Capillary: 101 mg/dL — ABNORMAL HIGH (ref 70–99)

## 2013-11-09 LAB — CBC
HCT: 32.1 % — ABNORMAL LOW (ref 39.0–52.0)
Hemoglobin: 10.7 g/dL — ABNORMAL LOW (ref 13.0–17.0)
MCH: 34.3 pg — ABNORMAL HIGH (ref 26.0–34.0)
MCHC: 33.3 g/dL (ref 30.0–36.0)
MCV: 102.9 fL — ABNORMAL HIGH (ref 78.0–100.0)
Platelets: 172 10*3/uL (ref 150–400)
RBC: 3.12 MIL/uL — ABNORMAL LOW (ref 4.22–5.81)
RDW: 16.8 % — AB (ref 11.5–15.5)
WBC: 10.5 10*3/uL (ref 4.0–10.5)

## 2013-11-09 LAB — PHOSPHORUS: Phosphorus: 1.8 mg/dL — ABNORMAL LOW (ref 2.3–4.6)

## 2013-11-09 LAB — MAGNESIUM: Magnesium: 1.1 mg/dL — ABNORMAL LOW (ref 1.5–2.5)

## 2013-11-09 MED ORDER — POTASSIUM CHLORIDE 20 MEQ/15ML (10%) PO LIQD: 40.0000 meq | Freq: Two times a day (BID) | ORAL | Status: DC

## 2013-11-09 MED ORDER — POTASSIUM CHLORIDE 20 MEQ/15ML (10%) PO LIQD
40.0000 meq | Freq: Two times a day (BID) | ORAL | Status: AC
  Administered 2013-11-09 (×2): 40 meq via ORAL
  Filled 2013-11-09 (×2): qty 30

## 2013-11-09 MED ORDER — MAGNESIUM SULFATE 4000MG/100ML IJ SOLN
4.0000 g | Freq: Once | INTRAMUSCULAR | Status: AC
  Administered 2013-11-09: 4 g via INTRAVENOUS
  Filled 2013-11-09: qty 100

## 2013-11-09 MED ORDER — POTASSIUM CHLORIDE CRYS ER 20 MEQ PO TBCR
30.0000 meq | EXTENDED_RELEASE_TABLET | Freq: Two times a day (BID) | ORAL | Status: DC
  Administered 2013-11-10: 30 meq via ORAL
  Filled 2013-11-09 (×2): qty 1

## 2013-11-09 NOTE — Progress Notes (Signed)
CSW continuing to follow for disposition planning.  CSW visited pt room to follow up regarding SNF bed offers.   Pt had visitors at this time, but visitors willing to step out of room in order for CSW to meet with pt and pt wife at bedside.   CSW met with pt and pt wife at bedside. CSW discussed SNF bed offers. Pt and pt wife choose [facility]. Pt requested CSW to communicate with pt sister, Lanetta Inch regarding disposition planning as well.  S/A was not addressed during this visit due to visitors awaiting to return to room but will be addressed prior to d/c to SNF.   CSW contacted pt sister, Lanetta Inch via telephone. CSW introduced self and explained role. CSW updated pt sister that pt and pt wife agreeable to [facility]. CSW expressed relief that [facility] was able to offer a bed as pt sister was familiar with facility.  Pt has [employer] which will require authorization prior to pt discharge. CSW to submit insurance authorization to [facility] once pt nearing clinical stability for discharge.  CSW contacted [facility] and notified facility that pt accepting of bed offer. [place] confirmed that facility will be able to accept pt when pt medically stable for discharge.  CSW to continue to follow to provide support and assist with pt discharge planning needs.   Alison Murray, MSW, [place] Work [phone]

## 2013-11-09 NOTE — Progress Notes (Signed)
Full report given to 4E RN. VSS at transfer.

## 2013-11-09 NOTE — Progress Notes (Signed)
Assessment/Rec:  1 Acute renal failure- in setting of long-term etoh cirrhosis. ?HRS with low urine Na and oliguria. Responded to levophed / IV albumin. On Midodrine PO. Will continue PO Lasix & spironolactone.  D/C Foley 2 Cirrhosis  3 Etoh abuse  4 Obesity  5 Hypotension, resolved 6 Hypokalemia K Replace  Subjective: Interval History: Good UOP overnight  Objective: Vital signs in last 24 hours: Temp:  [98.1 F (36.7 C)-98.8 F (37.1 C)] 98.8 F (37.1 C) (07/07 0400) Pulse Rate:  [91-136] 99 (07/07 0700) Resp:  [8-21] 8 (07/07 0700) BP: (98-144)/(55-83) 118/65 mmHg (07/07 0700) SpO2:  [93 %-99 %] 98 % (07/07 0700) Weight:  [130 kg (286 lb 9.6 oz)] 130 kg (286 lb 9.6 oz) (07/07 0600) Weight change: -4.8 kg (-10 lb 9.3 oz)  Intake/Output from previous day: 07/06 0701 - 07/07 0700 In: 1625 [P.O.:1625] Out: 2640 [Urine:2640] Intake/Output this shift:   General appearance: alert and cooperative Resp: clear to auscultation bilaterally Chest wall: no tenderness Cardio: regular rate and rhythm, S1, S2 normal, no murmur, click, rub or gallop Extremities: edema 2-3+ on R 3+ on L  Muscle atrophy  Lab Results:  Recent Labs  11/07/13 0444 11/09/13 0630  WBC 6.7 10.5  HGB 10.4* 10.7*  HCT 31.7* 32.1*  PLT 155 172   BMET:  Recent Labs  11/08/13 0543 11/09/13 0630  NA 137 139  K 3.5* 3.0*  CL 96 95*  CO2 25 26  GLUCOSE 103* 87  BUN 13 14  CREATININE 1.12 1.18  CALCIUM 8.5 8.4   No results found for this basename: PTH,  in the last 72 hours Iron Studies: No results found for this basename: IRON, TIBC, TRANSFERRIN, FERRITIN,  in the last 72 hours Studies/Results: No results found.  Scheduled: . buPROPion  100 mg Oral Daily  . feeding supplement (ENSURE COMPLETE)  237 mL Oral BID BM  . FLUoxetine  40 mg Oral Daily  . furosemide  40 mg Oral BID  . heparin subcutaneous  5,000 Units Subcutaneous 3 times per day  . insulin aspart  0-9 Units Subcutaneous TID WC  .  midodrine  10 mg Oral TID WC  . potassium chloride  30 mEq Oral BID  . rifaximin  550 mg Oral BID  . spironolactone  25 mg Oral BID  . thiamine  100 mg Oral Daily   Or  . thiamine  100 mg Intravenous Daily     LOS: 10 days   Antwian Santaana C 11/09/2013,8:35 AM

## 2013-11-09 NOTE — Progress Notes (Signed)
Physical Therapy Treatment Patient Details Name: Daralene MilchDavid C Dezeeuw MRN: [id number] DOB: 08/05/1955 Today's Date: 11/09/2013    History of Present Illness 58 yo male with H/O ETOH, depression, hyponatremia, metabolic acidosis,VDRF.    PT Comments    Pt is more alert, responds more quickly, following directions. progressing in mobility, increased HR during into 130's with activity.  Follow Up Recommendations  SNF;Supervision/Assistance - 24 hour     Equipment Recommendations  None recommended by PT    Recommendations for Other Services       Precautions / Restrictions Precautions Precautions: Fall Precaution Comments: monitor HR    Mobility  Bed Mobility Overal bed mobility: Needs Assistance Bed Mobility: Supine to Sit     Supine to sit: Mod assist     General bed mobility comments: improved in mobility,  Transfers Overall transfer level: Needs assistance Equipment used: Rolling walker (2 wheeled) Transfers: Sit to/from Stand Sit to Stand: Min assist            Ambulation/Gait Ambulation/Gait assistance: +2 safety/equipment;Min assist;Mod assist Ambulation Distance (Feet): 10 Feet Assistive device: Rolling walker (2 wheeled) Gait Pattern/deviations: Step-to pattern;Wide base of support     General Gait Details: 10' forward then backward, less balance backing up, cues to reach for armrests.   Stairs            Wheelchair Mobility    Modified Rankin (Stroke Patients Only)       Balance Overall balance assessment: Needs assistance Sitting-balance support: Feet supported Sitting balance-Leahy Scale: Fair Sitting balance - Comments: able to sit at midline   Standing balance support: Bilateral upper extremity supported;During functional activity Standing balance-Leahy Scale: Poor                      Cognition Arousal/Alertness: Awake/alert Behavior During Therapy: WFL for tasks assessed/performed                         Exercises      General Comments        Pertinent Vitals/Pain 100-133    Home Living                      Prior Function            PT Goals (current goals can now be found in the care plan section) Progress towards PT goals: Progressing toward goals    Frequency  Min 3X/week    PT Plan Current plan remains appropriate    Co-evaluation             End of Session Equipment Utilized During Treatment: Gait belt Activity Tolerance: Patient tolerated treatment well Patient left: in chair;with call bell/phone within reach;with family/visitor present     Time: [id number] PT Time Calculation (min): 20 min  Charges:  $Gait Training: 8-22 mins                    G Codes:      Rada HayHill, Jkayla Spiewak Elizabeth 11/09/2013, 12:53 PM Blanchard KelchKaren Aiman Noe PT [phone]

## 2013-11-09 NOTE — Progress Notes (Signed)
PROGRESS NOTE  [name] ZOX:[id number] DOB: 02-13-1956 DOA: 10/30/2013 PCP: Darnelle Bos, MD  HPI: 58 y.o. male with prior h/o hypertension, DM, alcohol abuse, who presented [facility] 6/27 for ETOH detox. Found to have hyponatremia & metabolic acidosis. Admitted to the medical floor and decompensated with tachycardia, AMS, respiratory failure. Tx to ICU 6/29 requiring intubation / TLC, & precedex.   Interval course - patient initially admitted to ICU as below, transferred to [facility] service on 7.3. His renal failure was c/w hepatorenal syndrome. Nephrology has been consulted and patient was started on Levophed and IV albumin. His urine output has increased significantly and was able to come off of the levophed and transitioned to Midodrine on 7/5 while maintaining UOP and stable renal function. Lasix was added to facilitate diuresis on 7/, initially IV and transitioned to po.   Assessment/Plan: Acute renal failure - due to hepatorenal syndrome, appreciate nephrology assistance, started on Levophed 7/3 and off on 7/5, transitioned to IV lasix and now on PO since 7/6,  - renal function holding, UOP good.  - 2D echo with good EF - appreciate Nephrology assistance Hypoglycemia - due to poor po intake, advanced liver disease - eating better, will discontinue D10 this morning Acute pancreatitis - monitor for now, no epigastric pain today, no nausea/vomiting Fever without clear infectious source, may be due to pancreatitis, cultures negative - afebrile, cultures negative, s/p 7 days of Abx, discontinued 7/6 - remains afebrile  Anasarca- trying to diurese per nephrology  Liver cirrhosis- due to ETOH Diarrhea - C diff negative, monitor, improving Acute encephalopathy - due to liver disease, improving on rifaximin, more alert Anemia - due to chronic ETOH, malnutrition, critical illness, liver disease Thrombocytopenia - due to chronic ETOH, malnutrition, critical illness, liver disease Acute  hypoxic respiratory failure - due to withdrawal (inability to protect airway) requiring vent support, stable now and extubated.  Depression - will consider psych evaluation once clinical status improves.  ETOH withdrawal - stable now  SIGNIFICANT EVENTS:  6/27 - admitted for ETOH detox  6/29 - tachycardia (160 bpm) and irregular respiratory pattern and increased work of breathing, PCCM to see.  6/30 - Hematuria, continues to need pressors  7/01 - Extubated, off pressors, heart rate elevated  7/02 - On 1L [place], more alert/oriented  7/3 - [facility] transfer 7/3 - developing early HRS, Nephrology consult, Levophed initiated and IV albumin 7/5 - off Levophed, on [place]ine 7/7 - transfer to telemetry   STUDIES:  6/29 - Abd U/S: Hepatic steatosis consistent with hepatic cirrhosis, portal vein with hepatopetal flow  6/30 - TTE > LVEF 60%, mild concentric LVH, normal valves and RV   LINES / TUBES:  OETT 6/29 >>> 7/01  R IJ 6/29 >>>  R radial arterial line >>> 7/01   CULTURES:  6/27 Staph aureus PCR >> positive (neg MRSA)  6/29 Blood cultures >>>  6/30 Stool culture >>>  7/01 C-diff >>> negative   ANTIBIOTICS:  Vancomycin 6/29 >>> 7/6 Zosyn 6/29 >>> 7/6  Diet: regular  Fluids: none  DVT Prophylaxis: heparin   Code Status: Full Family Communication: d/w wife at bedside and sister Corrie Dandy over the phone (C [phone], [place] [phone]) Disposition Plan: inpatient  Consultants:  PCCM  Nephrology    HPI/Subjective: - feels better, no complaints, no chest pain, breathing comfortable  Objective: Filed Vitals:   11/09/13 0400 11/09/13 0500 11/09/13 0600 11/09/13 0700  BP: 125/65 116/71 144/65 118/65  Pulse: 98 95 105 99  Temp: 98.8 F (  37.1 C)     TempSrc: Oral     Resp: 19 18 10 8   Height:      Weight:   130 kg (286 lb 9.6 oz)   SpO2: 96% 95% 97% 98%    Intake/Output Summary (Last 24 hours) at 11/09/13 0900 Last data filed at 11/09/13 0630  Gross per 24 hour  Intake   1425 ml    Output   2290 ml  Net   -865 ml   Filed Weights   11/07/13 0400 11/08/13 0400 11/09/13 0600  Weight: 134.2 kg (295 lb 13.7 oz) 134.8 kg (297 lb 2.9 oz) 130 kg (286 lb 9.6 oz)   Exam:  General:  NAD, morbidly obese male  Cardiovascular: regular rate and rhythm, without MRG  Respiratory: good air movement, clear to auscultation throughout, no wheezing, ronchi or rales  Abdomen: obese, soft, non tender, clinically without ascites  MSK: 2-3+ pitting edema, anasarca  Neuro: non focal  Data Reviewed: Basic Metabolic Panel:  Recent Labs Lab 11/05/13 0530 11/06/13 0525 11/07/13 0444 11/08/13 0543 11/09/13 0630  NA 139 137 137 137 139  K 3.7 3.5* 3.3* 3.5* 3.0*  CL 97 95* 95* 96 95*  CO2 24 22 22 25 26   GLUCOSE 60* 61* 88 103* 87  BUN 20 17 14 13 14   CREATININE 1.61* 1.21 1.05 1.12 1.18  CALCIUM 7.3* 7.6* 7.9* 8.5 8.4  MG 1.7 1.5 1.8 1.6 1.1*  PHOS 2.2* 1.7* 1.7* 1.6* 1.8*   Liver Function Tests:  Recent Labs Lab 11/05/13 0530 11/06/13 0525 11/07/13 0444 11/08/13 0545 11/09/13 0630  AST 300* 182* 152* 155* 213*  ALT 177* 133* 122* 111* 111*  ALKPHOS 198* 201* 237* 257* 288*  BILITOT 4.7* 5.1* 5.6* 5.8* 6.6*  PROT 4.9* 4.8* 4.6* 4.6* 4.6*  ALBUMIN 1.9* 2.2* 2.1* 2.0* 1.8*    Recent Labs Lab 11/03/13 1654 11/05/13 0530  AMMONIA 62* 52   CBC:  Recent Labs Lab 11/03/13 0340 11/05/13 0530 11/06/13 0525 11/07/13 0444 11/09/13 0630  WBC 6.4 7.1 5.8 6.7 10.5  HGB 9.5* 10.1* 9.8* 10.4* 10.7*  HCT 27.8* 30.6* 30.2* 31.7* 32.1*  MCV 99.6 103.7* 104.1* 102.6* 102.9*  PLT 116* 157 148* 155 172   Cardiac Enzymes: No results found for this basename: CKTOTAL, CKMB, CKMBINDEX, TROPONINI,  in the last 168 hours CBG:  Recent Labs Lab 11/08/13 0750 11/08/13 1251 11/08/13 1649 11/08/13 2127 11/09/13 0747  GLUCAP 115* 118* 129* 125* 90    Recent Results (from the past 240 hour(s))  MRSA PCR SCREENING     Status: None   Collection Time    11/01/13   8:31 AM      Result Value Ref Range Status   MRSA by PCR NEGATIVE  NEGATIVE Final   Comment:            The GeneXpert MRSA Assay (FDA     approved for NASAL specimens     only), is one component of a     comprehensive MRSA colonization     surveillance program. It is not     intended to diagnose MRSA     infection nor to guide or     monitor treatment for     MRSA infections.  CULTURE, BLOOD (ROUTINE X 2)     Status: None   Collection Time    11/01/13  1:49 PM      Result Value Ref Range Status   Specimen Description BLOOD CENTRAL LINE  Final   Special Requests BOTTLES DRAWN AEROBIC AND ANAEROBIC 7CC   Final   Culture  Setup Time     Final   Value: 11/01/2013 22:54     Performed at [facility]   Culture     Final   Value: NO GROWTH 5 DAYS     [facility]   Report Status 11/07/2013 FINAL   Final  CULTURE, BLOOD (ROUTINE X 2)     Status: None   Collection Time    11/01/13  4:34 PM      Result Value Ref Range Status   Specimen Description BLOOD RIGHT ARM   Final   Special Requests BOTTLES DRAWN AEROBIC AND ANAEROBIC 10CC   Final   Culture  Setup Time     Final   Value: 11/01/2013 22:51     Performed at [facility]   Culture     Final   Value: NO GROWTH 5 DAYS     Performed at [facility]   Report Status 11/07/2013 FINAL   Final  STOOL CULTURE     Status: None   Collection Time    11/02/13  8:52 PM      Result Value Ref Range Status   Specimen Description STOOL   Final   Special Requests NONE   Final   Culture     Final   Value: NO SALMONELLA, SHIGELLA, CAMPYLOBACTER, YERSINIA, OR E.COLI 0157:H7 ISOLATED     Performed at [facility]   Report Status 11/06/2013 FINAL   Final  CLOSTRIDIUM DIFFICILE BY PCR     Status: None   Collection Time    11/03/13  3:53 AM      Result Value Ref Range Status   C difficile by pcr NEGATIVE  NEGATIVE Final   Comment: [facility]  URINE CULTURE     Status:  None   Collection Time    11/05/13  8:00 AM      Result Value Ref Range Status   Specimen Description URINE, RANDOM   Final   Special Requests NONE   Final   Culture  Setup Time     Final   Value: 11/05/2013 10:39     Performed at [facility]   Colony Count     Final   Value: NO GROWTH     Performed at [facility]   Culture     Final   Value: NO GROWTH     Performed at [facility]   Report Status 11/06/2013 FINAL   Final     Studies: No results found.  Scheduled Meds: . buPROPion  100 mg Oral Daily  . feeding supplement (ENSURE COMPLETE)  237 mL Oral BID BM  . FLUoxetine  40 mg Oral Daily  . furosemide  40 mg Oral BID  . heparin subcutaneous  5,000 Units Subcutaneous 3 times per day  . insulin aspart  0-9 Units Subcutaneous TID WC  . midodrine  10 mg Oral TID WC  . potassium chloride  40 mEq Oral BID  . rifaximin  550 mg Oral BID  . spironolactone  25 mg Oral BID  . thiamine  100 mg Oral Daily   Or  . thiamine  100 mg Intravenous Daily   Continuous Infusions: . dextrose Stopped (11/07/13 2200)  . norepinephrine (LEVOPHED) Adult infusion Stopped (11/07/13 2200)    Active Problems:   DIABETES MELLITUS   Morbid obesity  DEPRESSIVE DISORDER NOT ELSEWHERE CLASSIFIED   HYPERTENSION   Acute alcoholic hepatitis   ALCOHOL ABUSE, HX OF   Alcohol withdrawal   Hyponatremia   Elevated liver function tests   Metabolic acidosis   Acute renal failure   Acute respiratory failure   Hypovolemic shock  Time spent: 25  This note has been created with Education officer, environmental. Any transcriptional errors are unintentional.   Pamella Pert, MD [facility] Pager [phone]. If 7 PM - 7 AM, please contact night-coverage at [email or web address], password [facility] 11/09/2013, 9:00 AM  LOS: 10 days

## 2013-11-10 LAB — CBC
HEMATOCRIT: 32.5 % — AB (ref 39.0–52.0)
Hemoglobin: 10.8 g/dL — ABNORMAL LOW (ref 13.0–17.0)
MCH: 33.8 pg (ref 26.0–34.0)
MCHC: 33.2 g/dL (ref 30.0–36.0)
MCV: 101.6 fL — ABNORMAL HIGH (ref 78.0–100.0)
Platelets: 178 10*3/uL (ref 150–400)
RBC: 3.2 MIL/uL — ABNORMAL LOW (ref 4.22–5.81)
RDW: 16.8 % — ABNORMAL HIGH (ref 11.5–15.5)
WBC: 11.5 10*3/uL — ABNORMAL HIGH (ref 4.0–10.5)

## 2013-11-10 LAB — COMPREHENSIVE METABOLIC PANEL
ALT: 129 U/L — AB (ref 0–53)
ANION GAP: 17 — AB (ref 5–15)
AST: 279 U/L — ABNORMAL HIGH (ref 0–37)
Albumin: 1.7 g/dL — ABNORMAL LOW (ref 3.5–5.2)
Alkaline Phosphatase: 307 U/L — ABNORMAL HIGH (ref 39–117)
BUN: 15 mg/dL (ref 6–23)
CALCIUM: 8.4 mg/dL (ref 8.4–10.5)
CO2: 25 mEq/L (ref 19–32)
Chloride: 92 mEq/L — ABNORMAL LOW (ref 96–112)
Creatinine, Ser: 1.31 mg/dL (ref 0.50–1.35)
GFR, EST AFRICAN AMERICAN: 68 mL/min — AB (ref >90)
GFR, EST NON AFRICAN AMERICAN: 59 mL/min — AB (ref >90)
GLUCOSE: 88 mg/dL (ref 70–99)
Potassium: 3.3 mEq/L — ABNORMAL LOW (ref 3.7–5.3)
SODIUM: 134 meq/L — AB (ref 137–147)
Total Bilirubin: 7.6 mg/dL — ABNORMAL HIGH (ref 0.3–1.2)
Total Protein: 4.9 g/dL — ABNORMAL LOW (ref 6.0–8.3)

## 2013-11-10 LAB — MAGNESIUM: Magnesium: 1.4 mg/dL — ABNORMAL LOW (ref 1.5–2.5)

## 2013-11-10 LAB — GLUCOSE, CAPILLARY
Glucose-Capillary: 80 mg/dL (ref 70–99)
Glucose-Capillary: 137 mg/dL — ABNORMAL HIGH (ref 70–99)

## 2013-11-10 LAB — PHOSPHORUS: Phosphorus: 1.9 mg/dL — ABNORMAL LOW (ref 2.3–4.6)

## 2013-11-10 MED ORDER — MIDODRINE HCL 10 MG PO TABS: 10.0000 mg | ORAL_TABLET | Freq: Three times a day (TID) | ORAL | Status: DC

## 2013-11-10 MED ORDER — SODIUM CHLORIDE 0.9 % IJ SOLN: 10.0000 mL | Freq: Two times a day (BID) | INTRAMUSCULAR | Status: DC

## 2013-11-10 MED ORDER — POTASSIUM CHLORIDE CRYS ER 15 MEQ PO TBCR: 30.0000 meq | EXTENDED_RELEASE_TABLET | Freq: Two times a day (BID) | ORAL | Status: DC

## 2013-11-10 MED ORDER — FUROSEMIDE 40 MG PO TABS: 40.0000 mg | ORAL_TABLET | Freq: Two times a day (BID) | ORAL | Status: DC

## 2013-11-10 MED ORDER — RIFAXIMIN 550 MG PO TABS: 550.0000 mg | ORAL_TABLET | Freq: Two times a day (BID) | ORAL | Status: DC

## 2013-11-10 MED ORDER — ENSURE COMPLETE PO LIQD: 237.0000 mL | Freq: Two times a day (BID) | ORAL | Status: AC

## 2013-11-10 MED ORDER — SODIUM CHLORIDE 0.9 % IJ SOLN
10.0000 mL | INTRAMUSCULAR | Status: DC | PRN
  Administered 2013-11-10: 10 mL

## 2013-11-10 MED ORDER — SPIRONOLACTONE 25 MG PO TABS: 25.0000 mg | ORAL_TABLET | Freq: Two times a day (BID) | ORAL | Status: DC

## 2013-11-10 NOTE — Progress Notes (Signed)
Clinical Social Work  [facility] [place] [facility] completing paperwork with family. [employer] has authorized SNF stay. Auth # [id number] RVB. Patient reports he does not need ambulance transportation and family will take him. MD aware of authorization and will complete DC paperwork when patient is medically stable.  Unk LightningHolly Giorgi Debruin, LCSW (Coverage for [employer])

## 2013-11-10 NOTE — Discharge Summary (Signed)
Physician Discharge Summary  [name]:[id number] DOB: 23-Dec-1955 DOA: 10/30/2013  PCP: Horton Finer, MD  Admit date: 10/30/2013 Discharge date: 11/10/2013  Time spent: >35 minutes  Recommendations for Outpatient Follow-up:  SNF F/u with GI in 3-4 week s F/u with nephrolgist in 1-2 week  CMP, labs next week Monday per SNF physician  Discharge Diagnoses:  Active Problems:   DIABETES MELLITUS   Morbid obesity   DEPRESSIVE DISORDER NOT ELSEWHERE CLASSIFIED   HYPERTENSION   Acute alcoholic hepatitis   ALCOHOL ABUSE, HX OF   Alcohol withdrawal   Hyponatremia   Elevated liver function tests   Metabolic acidosis   Acute renal failure   Acute respiratory failure   Hypovolemic shock   Discharge Condition: stable   Diet recommendation: low sodium   Filed Weights   11/08/13 0400 11/09/13 0600 11/10/13 0438  Weight: 134.8 kg (297 lb 2.9 oz) 130 kg (286 lb 9.6 oz) 131 kg (288 lb 12.8 oz)    History of present illness:  58 y.o. male with prior h/o hypertension, DM, alcohol abuse, who presented [facility] 6/27 for ETOH detox. Found to have hyponatremia & metabolic acidosis. Admitted to the medical floor and decompensated with tachycardia, AMS, respiratory failure. Tx to ICU 6/29 requiring intubation / TLC, & precedex.  Interval course  - patient initially admitted to ICU as below, transferred to [facility] service on 7.3. His renal failure was c/w hepatorenal syndrome. Nephrology has been consulted and patient was started on Levophed and IV albumin. His urine output has increased significantly and was able to come off of the levophed and transitioned to Midodrine on 7/5 while maintaining UOP and stable renal function. Lasix was added to facilitate diuresis on 7/, initially IV and transitioned to po.    Hospital Course:  1. Acute renal failure - due to hepatorenal syndrome, appreciate nephrology assistance, started on Levophed 7/3 and off on 7/5, transitioned to IV lasix and now on PO  since 7/6,  - renal function holding, UOP good.  - 2D echo with good EF  - cont outpatient follow up with nephrologist in 1 week; labs 4-5 days at SNF   2. Hypoglycemia - due to poor po intake, advanced liver disease  - eating better, discontinued D10 3. Acute pancreatitis - monitor for now, no epigastric pain today, no nausea/vomiting; improved   4. Fever without clear infectious source, may be due to pancreatitis, cultures negative  - afebrile, cultures negative, s/p 7 days of Abx, discontinued 7/6; remains afebrile  5. Anasarca- started diuresis with lasix+spironalactone; monitor urin output; I/O, labs at SNF   6. Liver cirrhosis, elevated bili, alk phos; - due to ETOH  -d/w Dr. Ardis Hughs from GI, who recommended to f/u as outpatient; f/u LFTs next week on Monday at SNF     7. Diarrhea - C diff negative, monitor, improving  8. Acute encephalopathy - due to liver disease, improving on rifaximin, more alert  9. Anemia - due to chronic ETOH, malnutrition, critical illness, liver disease; no s/s of acute bleeding  10. Thrombocytopenia - due to chronic ETOH, malnutrition, critical illness, liver disease  11. Acute hypoxic respiratory failure - due to withdrawal (inability to protect airway) requiring vent support, stable now and extubated.  12. Depression - denies suicidal or homicidal ideation or plans  -cont outpatient psychiatry follow up    ETOH withdrawal - stable now  -stop drinking alcohol    SIGNIFICANT EVENTS:  6/27 - admitted for ETOH detox  6/29 - tachycardia (  160 bpm) and irregular respiratory pattern and increased work of breathing, PCCM to see.  6/30 - Hematuria, continues to need pressors  7/01 - Extubated, off pressors, heart rate elevated  7/02 - On 1L [place], more alert/oriented  7/3 - [facility] transfer  7/3 - developing early HRS, Nephrology consult, Levophed initiated and IV albumin  7/5 - off Levophed, on [place]  7/7 - transfer to telemetry  STUDIES:  6/29 - Abd U/S:  Hepatic steatosis consistent with hepatic cirrhosis, portal vein with hepatopetal flow  6/30 - TTE > LVEF 60%, mild concentric LVH, normal valves and RV  LINES / TUBES:  OETT 6/29 >>> 7/01  R IJ 6/29 >>>  R radial arterial line >>> 7/01  CULTURES:  6/27 Staph aureus PCR >> positive (neg MRSA)  6/29 Blood cultures >>>  6/30 Stool culture >>>  7/01 C-diff >>> negative  ANTIBIOTICS:  Vancomycin 6/29 >>> 7/6  Zosyn 6/29 >>> 7/6      Discharge Exam: Filed Vitals:   11/10/13 0438  BP: 107/66  Pulse: 88  Temp: 98.4 F (36.9 C)  Resp: 20    General: alert Cardiovascular: s1,s2 rrr Respiratory: CTA BL  Discharge Instructions  Discharge Instructions   Diet - low sodium heart healthy    Complete by:  As directed      Discharge instructions    Complete by:  As directed   Please follow up with gastroenterologist in 3-4 weeks  Please follow up with nephrologist in 1 week Please follow up with primary care doctor in 1 week     Increase activity slowly    Complete by:  As directed             Medication List    STOP taking these medications       metoprolol succinate 50 MG 24 hr tablet  Commonly known as:  TOPROL-XL     zolpidem 12.5 MG CR tablet  Commonly known as:  AMBIEN CR      TAKE these medications       buPROPion 100 MG 12 hr tablet  Commonly known as:  WELLBUTRIN SR  Take 100 mg by mouth daily.     feeding supplement (ENSURE COMPLETE) Liqd  Take 237 mLs by mouth 2 (two) times daily between meals.     FLUoxetine 40 MG capsule  Commonly known as:  PROZAC  Take 40 mg by mouth daily.     furosemide 40 MG tablet  Commonly known as:  LASIX  Take 1 tablet (40 mg total) by mouth 2 (two) times daily.     midodrine 10 MG tablet  Commonly known as:  PROAMATINE  Take 1 tablet (10 mg total) by mouth 3 (three) times daily with meals.     OVER THE COUNTER MEDICATION  Take 2 tablets by mouth daily. Anti liver pills     potassium chloride SA 15 MEQ tablet   Commonly known as:  KLOR-CON M15  Take 2 tablets (30 mEq total) by mouth 2 (two) times daily.     rifaximin 550 MG Tabs tablet  Commonly known as:  XIFAXAN  Take 1 tablet (550 mg total) by mouth 2 (two) times daily.     spironolactone 25 MG tablet  Commonly known as:  ALDACTONE  Take 1 tablet (25 mg total) by mouth 2 (two) times daily.       No Known Allergies     Follow-up Information   Follow up with Horton Finer, MD. Schedule an appointment  as soon as possible for a visit in 1 week.   Specialty:  Internal Medicine   Contact information:   [address], [address] [place] [address] [phone]       Follow up with Estanislado Emms, MD In 1 week.   Specialty:  Nephrology   Contact information:   [place] [address] [phone]       Follow up with EDWARDS JR,JAMES L, MD In 3 weeks.   Specialty:  Gastroenterology   Contact information:   [address] [place] [address] [phone]        The results of significant diagnostics from this hospitalization (including imaging, microbiology, ancillary and laboratory) are listed below for reference.    Significant Diagnostic Studies: Ct Abdomen Pelvis Wo Contrast  11/05/2013   CLINICAL DATA:  persistent fever, renal failure  EXAM: CT ABDOMEN AND PELVIS WITHOUT CONTRAST  TECHNIQUE: Multidetector CT imaging of the abdomen and pelvis was performed following the standard protocol without IV contrast.  COMPARISON:  None.  FINDINGS: Mild areas of linear density within the lung bases. Trace bilateral effusions right greater than left.  Diffuse low attenuation throughout the liver.  The spleen, adrenals, kidneys are unremarkable.  There is mild inflammatory change within the peripancreatic fat, loss of the normal undulating border of pancreas and mild pancreatic edema. No associated peripancreatic loculated fluid collections. Trace amount of peripancreatic  free fluid. Dense bile within the gallbladder. Gallbladder fossa otherwise unremarkable.  Postsurgical changes appreciated involving the stomach which appear to represent prior gastric bypass surgery. Noncontrast evaluation of the bowel otherwise negative. There no aggressive appearing osseous lesions. Areas of spondylosis within the lower lumbar spine.  There is no abdominal aortic aneurysm. Within the limitations of a noncontrast CT no abdominal or pelvic masses or adenopathy.  No abdominal wall hernia.  Left inguinal hernia containing fat and fluid, small  IMPRESSION: CT consistent with pancreatitis. Correlation with pancreatic function tests recommended no evidence of a pancreatic pseudocyst.  Hepatic steatosis.   Electronically Signed   By: Margaree Mackintosh M.D.   On: 11/05/2013 11:14   Dg Chest 2 View  10/31/2013   CLINICAL DATA:  58 year old male with shortness of breath. Hypertension. Cough. Query aspiration. Initial encounter.  EXAM: CHEST  2 VIEW  COMPARISON:  10/30/2009.  FINDINGS: Semi upright AP and lateral views of the chest. Lower lung volumes with more elevation of the right hemidiaphragm. Normal cardiac size and mediastinal contours. Visualized tracheal air column is within normal limits. No confluent pulmonary opacity or consolidation. No pneumothorax, edema, or pleural effusion. No acute osseous abnormality identified.  IMPRESSION: Lower lung volumes, otherwise no acute cardiopulmonary abnormality.   Electronically Signed   By: Lars Pinks M.D.   On: 10/31/2013 16:24   Dg Abd 1 View  11/01/2013   CLINICAL DATA:  Orogastric tube placement.  EXAM: ABDOMEN - 1 VIEW  COMPARISON:  None.  FINDINGS: Orogastric tube extends into the esophagus with the tip located in the chest at the juncture of the mid and distal esophagus. Vague soft tissue density is seen in the abdomen with no evidence of bowel obstruction or significant ileus. There may be underlying ascites.  IMPRESSION: Orogastric tube tip lies in  the esophagus at the juncture of the mid and distal thirds. Possible ascites.   Electronically  Signed   By: Aletta Edouard M.D.   On: 11/01/2013 17:23   US Abdomen Complete  11/01/2013   CLINICAL DATA:  Critically ill intubated patient in the ICU with elevated LFTs. Prior history of alcoholic hepatitis.  EXAM: ULTRASOUND ABDOMEN COMPLETE  COMPARISON:  03/02/2008.  FINDINGS: Gallbladder:  No shadowing gallstones or echogenic sludge. No gallbladder wall thickening or pericholecystic fluid. Negative sonographic Murphy sign according to the ultrasound technologist.  Common bile duct:  Diameter: Approximately 8 mm centrally.  Liver:  Diffusely increased and coarsened echotexture without focal hepatic parenchymal abnormality. Relatively enlarged left lobe relative to the right lobe, with subtle irregularity in the hepatic contour. Patent portal vein with hepatopetal flow.  IVC:  Patent in its intrahepatic portion. Not visualized outside of the liver.  Pancreas:  Normal-appearing head and body. Tail obscured by overlying bowel gas.  Spleen:  Normal size and echotexture without focal parenchymal abnormality.  Right Kidney:  Length: Approximately 11.0 cm. No hydronephrosis. Well-preserved cortex. No shadowing calculi. Normal parenchymal echotexture without focal abnormalities.  Left Kidney:  Length: Approximately 11.0 cm. No hydronephrosis. Well-preserved cortex. No shadowing calculi. Normal parenchymal echotexture without focal abnormalities.  Abdominal aorta:  Normal in caliber throughout its visualized course in the abdomen with evidence of atherosclerosis. Obscured distally by overlying bowel gas. Maximum diameter 2.3 cm.  Other findings:  None.  IMPRESSION: 1. Diffuse hepatic steatosis without focal hepatic parenchymal abnormality. Sonographic findings consistent with hepatic cirrhosis. 2. Patent portal vein with hepatopetal flow. 3. Otherwise normal examination with a caveat that the extrahepatic IVC, the  pancreatic tail, and the distal abdominal aorta were obscured by bowel gas or therefore not evaluated.   Electronically Signed   By: Evangeline Dakin M.D.   On: 11/01/2013 18:28   Dg Chest Port 1 View  11/04/2013   CLINICAL DATA:  Fever  EXAM: PORTABLE CHEST - 1 VIEW  COMPARISON:  November 02, 2013  FINDINGS: Endotracheal tube and nasogastric tube have been removed. Central catheter tip is in the superior vena cava. No pneumothorax. There is atelectatic change in both lung bases, more on the left than on the right. Elsewhere lungs are clear. Heart size and pulmonary vascularity are normal. No adenopathy.  IMPRESSION: Bilateral lower lobe atelectatic change, slightly more on the left than on the right. No pneumothorax. No edema or consolidation.   Electronically Signed   By: Lowella Grip M.D.   On: 11/04/2013 10:57   Dg Chest Port 1 View  11/02/2013   CLINICAL DATA:  Endotracheal tube placement  EXAM: PORTABLE CHEST - 1 VIEW  COMPARISON:  11/01/2013  FINDINGS: Endotracheal tube is in good position. Central venous catheter tip at the lower SVC. NG tube enters the stomach.  Bibasilar atelectasis unchanged.  Negative for edema or effusion.  IMPRESSION: Support lines in good position. Mild bibasilar atelectasis is unchanged.   Electronically Signed   By: Franchot Gallo M.D.   On: 11/02/2013 07:18   Dg Chest Port 1 View  11/01/2013   CLINICAL DATA:  Line placement and endotracheal tube placement  EXAM: PORTABLE CHEST - 1 VIEW  COMPARISON:  October 31, 2013  FINDINGS: The heart size and mediastinal contours are stable. Endotracheal tube is identified with distal tip 2.1 cm from carina. Retraction by 2 cm is recommended. A right jugular central venous line is identified with distal tip in the superior vena cava. There is mild central pulmonary vascular congestion. There is no pneumothorax. There is no focal pneumonia. The visualized skeletal  structures are stable.  IMPRESSION: Endotracheal tube identified with distal  tip 2.1 cm from carina. Retraction by 2 cm is recommended. There is no pneumothorax. Right jugular central venous line in good position.   Electronically Signed   By: Abelardo Diesel M.D.   On: 11/01/2013 15:24    Microbiology: Recent Results (from the past 240 hour(s))  MRSA PCR SCREENING     Status: None   Collection Time    11/01/13  8:31 AM      Result Value Ref Range Status   MRSA by PCR NEGATIVE  NEGATIVE Final   Comment:            The GeneXpert MRSA Assay (FDA     approved for NASAL specimens     only), is one component of a     comprehensive MRSA colonization     surveillance program. It is not     intended to diagnose MRSA     infection nor to guide or     monitor treatment for     MRSA infections.  CULTURE, BLOOD (ROUTINE X 2)     Status: None   Collection Time    11/01/13  1:49 PM      Result Value Ref Range Status   Specimen Description BLOOD CENTRAL LINE   Final   Special Requests BOTTLES DRAWN AEROBIC AND ANAEROBIC [facility]   Final   Culture  Setup Time     Final   Value: 11/01/2013 22:54     Performed at [facility]   Culture     Final   Value: NO GROWTH 5 DAYS     Performed at [facility]   Report Status 11/07/2013 FINAL   Final  CULTURE, BLOOD (ROUTINE X 2)     Status: None   Collection Time    11/01/13  4:34 PM      Result Value Ref Range Status   Specimen Description BLOOD RIGHT ARM   Final   Special Requests BOTTLES DRAWN AEROBIC AND ANAEROBIC 10CC   Final   Culture  Setup Time     Final   Value: 11/01/2013 22:51     Performed at [facility]   Culture     Final   Value: NO GROWTH 5 DAYS     Performed at [facility]   Report Status 11/07/2013 FINAL   Final  STOOL CULTURE     Status: None   Collection Time    11/02/13  8:52 PM      Result Value Ref Range Status   Specimen Description STOOL   Final   Special Requests NONE   Final   Culture     Final   Value: NO SALMONELLA, SHIGELLA, CAMPYLOBACTER, YERSINIA, OR E.COLI  0157:H7 ISOLATED     Performed at [facility]   Report Status 11/06/2013 FINAL   Final  CLOSTRIDIUM DIFFICILE BY PCR     Status: None   Collection Time    11/03/13  3:53 AM      Result Value Ref Range Status   C difficile by pcr NEGATIVE  NEGATIVE Final   Comment: Performed at [facility]     Status: None   Collection Time    11/05/13  8:00 AM      Result Value Ref Range Status   Specimen Description URINE, RANDOM   Final   Special Requests NONE   Final   Culture  Setup Time  Final   Value: 11/05/2013 10:39     Performed at [place]     Final   Value: NO GROWTH     Performed at [facility]   Culture     Final   Value: NO GROWTH     Performed at [facility]   Report Status 11/06/2013 FINAL   Final     Labs: Basic Metabolic Panel:  Recent Labs Lab 11/06/13 0525 11/07/13 0444 11/08/13 0543 11/09/13 0630 11/10/13 0605  NA 137 137 137 139 134*  K 3.5* 3.3* 3.5* 3.0* 3.3*  CL 95* 95* 96 95* 92*  CO2 22 22 25 26 25   GLUCOSE 61* 88 103* 87 88  BUN 17 14 13 14 15   CREATININE 1.21 1.05 1.12 1.18 1.31  CALCIUM 7.6* 7.9* 8.5 8.4 8.4  MG 1.5 1.8 1.6 1.1* 1.4*  PHOS 1.7* 1.7* 1.6* 1.8* 1.9*   Liver Function Tests:  Recent Labs Lab 11/06/13 0525 11/07/13 0444 11/08/13 0545 11/09/13 0630 11/10/13 0605  AST 182* 152* 155* 213* 279*  ALT 133* 122* 111* 111* 129*  ALKPHOS 201* 237* 257* 288* 307*  BILITOT 5.1* 5.6* 5.8* 6.6* 7.6*  PROT 4.8* 4.6* 4.6* 4.6* 4.9*  ALBUMIN 2.2* 2.1* 2.0* 1.8* 1.7*   No results found for this basename: LIPASE, AMYLASE,  in the last 168 hours  Recent Labs Lab 11/03/13 1654 11/05/13 0530  AMMONIA 62* 52   CBC:  Recent Labs Lab 11/05/13 0530 11/06/13 0525 11/07/13 0444 11/09/13 0630 11/10/13 0605  WBC 7.1 5.8 6.7 10.5 11.5*  HGB 10.1* 9.8* 10.4* 10.7* 10.8*  HCT 30.6* 30.2* 31.7* 32.1* 32.5*  MCV 103.7* 104.1* 102.6* 102.9* 101.6*  PLT 157 148*  155 172 178   Cardiac Enzymes: No results found for this basename: CKTOTAL, CKMB, CKMBINDEX, TROPONINI,  in the last 168 hours BNP: BNP (last 3 results) No results found for this basename: PROBNP,  in the last 8760 hours CBG:  Recent Labs Lab 11/09/13 1151 11/09/13 1657 11/09/13 2256 11/10/13 0707 11/10/13 1220  GLUCAP 151* 101* 103* 80 137*       Signed:  Lyndsie Wallman N  [facility] 11/10/2013, 12:29 PM

## 2013-11-10 NOTE — Progress Notes (Signed)
Clinical Social Work  Per MD, patient might be ready to DC today. CS[facility] and sent clincials to insurance for approval. CSW will continue to follow.  [place] Browning[place]olly Naomy [name], [place]CS[place] [phone]

## 2013-11-10 NOTE — Progress Notes (Signed)
Clinical Social Work  [facility]. CSW prepared DC packet with DC summary and FL2 included. RN to call report. Patient has chart copy to take to SNF. Family will transport patient and are happy that patient is able to go to their first choice. Patient and family thanked CSW for assistance but denies any further needs.  CSW is signing off but available if needed.  Unk LightningHolly Bryauna Byrum, LCSW (Coverage for [employer])

## 2013-11-10 NOTE — Progress Notes (Signed)
Patient discharged to [facility], discharge packet prepared by CSW and given to sister, patient transported to the facility via personal car by sister. Patient alert and oriented, denies any pain/distress, skin intact, bilateral feet dry and scaly, scab on right great toe and lateral feet. No pressure ulcer noted.

## 2013-11-10 NOTE — Discharge Instructions (Signed)
Please follow up with nephrologist in 1 week  Please follow up with gastroenterologist in 1-2 week s Please follow up with primary care doctor in 1 week after rehab

## 2013-11-11 ENCOUNTER — Encounter: Payer: Self-pay | Admitting: Adult Health

## 2013-11-11 ENCOUNTER — Non-Acute Institutional Stay (SKILLED_NURSING_FACILITY): Payer: Medicare Other | Admitting: Adult Health

## 2013-11-11 DIAGNOSIS — K7682 Hepatic encephalopathy: Secondary | ICD-10-CM

## 2013-11-11 DIAGNOSIS — F10230 Alcohol dependence with withdrawal, uncomplicated: Secondary | ICD-10-CM

## 2013-11-11 DIAGNOSIS — K7030 Alcoholic cirrhosis of liver without ascites: Secondary | ICD-10-CM

## 2013-11-11 DIAGNOSIS — K7290 Hepatic failure, unspecified without coma: Secondary | ICD-10-CM

## 2013-11-11 DIAGNOSIS — I959 Hypotension, unspecified: Secondary | ICD-10-CM | POA: Insufficient documentation

## 2013-11-11 DIAGNOSIS — R601 Generalized edema: Secondary | ICD-10-CM

## 2013-11-11 DIAGNOSIS — F3289 Other specified depressive episodes: Secondary | ICD-10-CM

## 2013-11-11 DIAGNOSIS — F10239 Alcohol dependence with withdrawal, unspecified: Secondary | ICD-10-CM

## 2013-11-11 DIAGNOSIS — F10930 Alcohol use, unspecified with withdrawal, uncomplicated: Secondary | ICD-10-CM

## 2013-11-11 DIAGNOSIS — R609 Edema, unspecified: Secondary | ICD-10-CM

## 2013-11-11 DIAGNOSIS — D638 Anemia in other chronic diseases classified elsewhere: Secondary | ICD-10-CM | POA: Insufficient documentation

## 2013-11-11 DIAGNOSIS — F10939 Alcohol use, unspecified with withdrawal, unspecified: Secondary | ICD-10-CM

## 2013-11-11 DIAGNOSIS — F329 Major depressive disorder, single episode, unspecified: Secondary | ICD-10-CM

## 2013-11-11 DIAGNOSIS — K7031 Alcoholic cirrhosis of liver with ascites: Secondary | ICD-10-CM

## 2013-11-11 DIAGNOSIS — E119 Type 2 diabetes mellitus without complications: Secondary | ICD-10-CM

## 2013-11-11 NOTE — Progress Notes (Signed)
Patient ID: [name], male   DOB: December 05, 1955, 58 y.o.   MRN: [id number]               PROGRESS NOTE  DATE: 11/11/2013  FACILITY: Nursing Home Location: [facility]  LEVEL OF CARE: SNF (31)  Acute Visit  CHIEF COMPLAINT:  Follow-up Hospitalization  HISTORY OF PRESENT ILLNESS: This is a 58 year old male who has been admitted [facility] on 11/10/13 from [facility] Encephalopathy, Anasarca, ETOH withdrawal and Liver cirrhosis. He has been admitted for a short-term rehabilitation  REASSESSMENT OF ONGOING PROBLEM(S):  DM:pt's DM remains stable.  Pt denies polyuria, polydipsia, polyphagia, changes in vision.  No  medication presently being used.  6/15 hemoglobin A1c is: 5.8  ANEMIA: The anemia has been stable. The patient denies fatigue, melena or hematochezia. No complications from the medications currently being used. 7/15 hgb 10.8  DEPRESSION: The depression remains stable. Patient denies ongoing feelings of sadness, insomnia, anedhonia or lack of appetite. No complications reported from the medications currently being used. Staff do not report behavioral problems.   PAST MEDICAL HISTORY : Reviewed.  No changes/see problem list  CURRENT MEDICATIONS: Reviewed per MAR/see medication list  REVIEW OF SYSTEMS:  GENERAL: no change in appetite, no fatigue, no weight changes, no fever, chills or weakness RESPIRATORY: no cough, SOB, DOE, wheezing, hemoptysis CARDIAC: no chest pain, or palpitations, + edema GI: no abdominal pain, diarrhea, constipation, heart burn, nausea or vomiting  PHYSICAL EXAMINATION  GENERAL: no acute distress, morbidly obese, +anasarca SKIN: slightly jaundice EYES: conjunctivae slightly icteric, sclerae normal, normal eye lids NECK: supple, trachea midline, no neck masses, no thyroid tenderness, no thyromegaly LYMPHATICS: no LAN in the neck, no supraclavicular LAN RESPIRATORY: breathing is even & unlabored, BS CTAB CARDIAC:  RRR, no murmur,no extra heart sounds, no edema GI: abdomen soft, normal BS, ascites EXTREMITIES:  Able to move all 4 extremities PSYCHIATRIC: the patient is alert & oriented to person, affect & behavior appropriate  LABS/RADIOLOGY: Labs reviewed: Basic Metabolic Panel:  Recent Labs  78/29/5605/10/18 0543 11/09/13 0630 11/10/13 0605  NA 137 139 134*  K 3.5* 3.0* 3.3*  CL 96 95* 92*  CO2 25 26 25   GLUCOSE 103* 87 88  BUN 13 14 15   CREATININE 1.12 1.18 1.31  CALCIUM 8.5 8.4 8.4  MG 1.6 1.1* 1.4*  PHOS 1.6* 1.8* 1.9*   Liver Function Tests:  Recent Labs  11/08/13 0545 11/09/13 0630 11/10/13 0605  AST 155* 213* 279*  ALT 111* 111* 129*  ALKPHOS 257* 288* 307*  BILITOT 5.8* 6.6* 7.6*  PROT 4.6* 4.6* 4.9*  ALBUMIN 2.0* 1.8* 1.7*    Recent Labs  11/02/13 0400 11/03/13 1654 11/05/13 0530  AMMONIA 39 62* 52   CBC:  Recent Labs  11/07/13 0444 11/09/13 0630 11/10/13 0605  WBC 6.7 10.5 11.5*  HGB 10.4* 10.7* 10.8*  HCT 31.7* 32.1* 32.5*  MCV 102.6* 102.9* 101.6*  PLT 155 172 178    Cardiac Enzymes:  Recent Labs  11/01/13 1321  TROPONINI <0.30   CBG:  Recent Labs  11/09/13 2256 11/10/13 0707 11/10/13 1220  GLUCAP 103* 80 137*    EXAM: ABDOMEN - 1 VIEW   COMPARISON:  None.   FINDINGS: Orogastric tube extends into the esophagus with the tip located in the chest at the juncture of the mid and distal esophagus. Vague soft tissue density is seen in the abdomen with no evidence of bowel obstruction or significant ileus. There may  be underlying ascites.   IMPRESSION: Orogastric tube tip lies in the esophagus at the juncture of the mid and distal thirds. Possible ascites. EXAM: ULTRASOUND ABDOMEN COMPLETE   COMPARISON:  03/02/2008.   FINDINGS: Gallbladder:   No shadowing gallstones or echogenic sludge. No gallbladder wall thickening or pericholecystic fluid. Negative sonographic Murphy sign according to the ultrasound technologist.   Common  bile duct:   Diameter: Approximately 8 mm centrally.   Liver:   Diffusely increased and coarsened echotexture without focal hepatic parenchymal abnormality. Relatively enlarged left lobe relative to the right lobe, with subtle irregularity in the hepatic contour. Patent portal vein with hepatopetal flow.   IVC:   Patent in its intrahepatic portion. Not visualized outside of the liver.   Pancreas:   Normal-appearing head and body. Tail obscured by overlying bowel gas.   Spleen:   Normal size and echotexture without focal parenchymal abnormality.   Right Kidney:   Length: Approximately 11.0 cm. No hydronephrosis. Well-preserved cortex. No shadowing calculi. Normal parenchymal echotexture without focal abnormalities.   Left Kidney:   Length: Approximately 11.0 cm. No hydronephrosis. Well-preserved cortex. No shadowing calculi. Normal parenchymal echotexture without focal abnormalities.   Abdominal aorta:   Normal in caliber throughout its visualized course in the abdomen with evidence of atherosclerosis. Obscured distally by overlying bowel gas. Maximum diameter 2.3 cm.   Other findings:   None.   IMPRESSION: 1. Diffuse hepatic steatosis without focal hepatic parenchymal abnormality. Sonographic findings consistent with hepatic cirrhosis. 2. Patent portal vein with hepatopetal flow. 3. Otherwise normal examination with a caveat that the extrahepatic IVC, the pancreatic tail, and the distal abdominal aorta were obscured by bowel gas or therefore not evaluated.   EXAM: PORTABLE CHEST - 1 VIEW   COMPARISON:  November 02, 2013   FINDINGS: Endotracheal tube and nasogastric tube have been removed. Central catheter tip is in the superior vena cava. No pneumothorax. There is atelectatic change in both lung bases, more on the left than on the right. Elsewhere lungs are clear. Heart size and pulmonary vascularity are normal. No adenopathy.   IMPRESSION: Bilateral  lower lobe atelectatic change, slightly more on the left than on the right. No pneumothorax. No edema or consolidation. EXAM: CT ABDOMEN AND PELVIS WITHOUT CONTRAST   TECHNIQUE: Multidetector CT imaging of the abdomen and pelvis was performed following the standard protocol without IV contrast.   COMPARISON:  None.   FINDINGS: Mild areas of linear density within the lung bases. Trace bilateral effusions right greater than left.   Diffuse low attenuation throughout the liver.   The spleen, adrenals, kidneys are unremarkable.   There is mild inflammatory change within the peripancreatic fat, loss of the normal undulating border of pancreas and mild pancreatic edema. No associated peripancreatic loculated fluid collections. Trace amount of peripancreatic free fluid. Dense bile within the gallbladder. Gallbladder fossa otherwise unremarkable.   Postsurgical changes appreciated involving the stomach which appear to represent prior gastric bypass surgery. Noncontrast evaluation of the bowel otherwise negative. There no aggressive appearing osseous lesions. Areas of spondylosis within the lower lumbar spine.   There is no abdominal aortic aneurysm. Within the limitations of a noncontrast CT no abdominal or pelvic masses or adenopathy.   No abdominal wall hernia.   Left inguinal hernia containing fat and fluid, small   IMPRESSION: CT consistent with pancreatitis. Correlation with pancreatic function tests recommended no evidence of a pancreatic pseudocyst.   Hepatic steatosis.    ASSESSMENT/PLAN:  Encephalopathy - improving; continue Rifaximin  Anasarca - continue Lasix and Spironolactone; for BMP check ETOH Withdrawal - start Ativan 1 mg PO TID PRN Depression - continue Wellbutrin and Prozac Diabetes mellitus, type II - check CBG daily Hypotension - continue midodrine; check BP every shift x1 week Liver cirrhosis -  for LFTs; followup with GI Anemia of chronic disease -  stable   CPT CODE: 60454  Ella Bodo - NP [facility] [phone]

## 2013-11-16 ENCOUNTER — Non-Acute Institutional Stay (SKILLED_NURSING_FACILITY): Payer: Medicare Other | Admitting: Internal Medicine

## 2013-11-16 DIAGNOSIS — K7031 Alcoholic cirrhosis of liver with ascites: Secondary | ICD-10-CM

## 2013-11-16 DIAGNOSIS — R7989 Other specified abnormal findings of blood chemistry: Secondary | ICD-10-CM

## 2013-11-16 DIAGNOSIS — R945 Abnormal results of liver function studies: Secondary | ICD-10-CM

## 2013-11-16 DIAGNOSIS — I10 Essential (primary) hypertension: Secondary | ICD-10-CM

## 2013-11-16 DIAGNOSIS — K7030 Alcoholic cirrhosis of liver without ascites: Secondary | ICD-10-CM

## 2013-11-16 DIAGNOSIS — E119 Type 2 diabetes mellitus without complications: Secondary | ICD-10-CM

## 2013-11-16 NOTE — Progress Notes (Signed)
HISTORY & PHYSICAL  DATE: 11/16/2013   FACILITY: [facility]  LEVEL OF CARE: SNF (31)  ALLERGIES:  No Known Allergies  CHIEF COMPLAINT:  Manage cirrhosis, diabetes mellitus and hypertension  HISTORY OF PRESENT ILLNESS: 58 year old Caucasian male was hospitalized secondary to acute encephalopathy. After hospitalization he admitted to this facility for short-term rehabilitation.  CIRRHOSIS: The cirrhosis remains stable.  Staff deny increasing confusion.  No complications reported from the medication(s) currently being used. Cirrhosis is secondary to alcohol abuse. Complains of chronic lower extremity swelling.  DM:pt's DM remains stable.  Pt denies polyuria, polydipsia, polyphagia, changes in vision or hypoglycemic episodes.  No complications noted from the medication presently being used.  Last hemoglobin A1c is: Not available.  HTN: Pt 's HTN remains stable.  Denies CP, sob, DOE, headaches, dizziness or visual disturbances.  No complications from the medications currently being used.  Last BP : 111/75.  PAST MEDICAL HISTORY :  Past Medical History  Diagnosis Date  . Hypertension   . Alcohol abuse     Drinks 1-2 large bottle wine per day  . Depression     PAST SURGICAL HISTORY: Past Surgical History  Procedure Laterality Date  . Back surgery    . Knnes time 2    . Minor nailbed repair Right 11/03/2012    Procedure: NAIL DEBRIDEMENT AND BIOPSY RIGHT THUMB NAIL;  Surgeon: Wyn Forster., MD;  Location: [facility];  Service: Orthopedics;  Laterality: Right;    SOCIAL HISTORY:  reports that he has never smoked. He has never used smokeless tobacco. He reports that he drinks about 2.4 ounces of alcohol per week. He reports that he does not use illicit drugs.  FAMILY HISTORY: None  CURRENT MEDICATIONS: Reviewed per MAR/see medication list  REVIEW OF SYSTEMS:  See HPI otherwise 14 point ROS is negative.  PHYSICAL  EXAMINATION  VS:  See VS section  GENERAL: no acute distress, morbidly obese body habitus SKIN: Jaundiced EYES: conjunctivae normal, sclerae normal, normal eye lids MOUTH/THROAT: lips without lesions,no lesions in the mouth,tongue is without lesions,uvula elevates in midline NECK: supple, trachea midline, no neck masses, no thyroid tenderness, no thyromegaly LYMPHATICS: no LAN in the neck, no supraclavicular LAN RESPIRATORY: breathing is even & unlabored, BS CTAB CARDIAC: RRR, no murmur,no extra heart sounds, +3 bilateral lower extremity edema GI:  ABDOMEN: abdomen soft, normal BS, no masses, no tenderness  LIVER/SPLEEN: no hepatomegaly, no splenomegaly MUSCULOSKELETAL: HEAD: normal to inspection  EXTREMITIES: LEFT UPPER EXTREMITY: full range of motion, normal strength & tone RIGHT UPPER EXTREMITY:  full range of motion, normal strength & tone LEFT LOWER EXTREMITY:  minimal range of motion, normal strength & tone RIGHT LOWER EXTREMITY:  Minimal range of motion, normal strength & tone PSYCHIATRIC: the patient is alert & oriented to person, affect & behavior appropriate  LABS/RADIOLOGY:  Labs reviewed: Basic Metabolic Panel:  Recent Labs  16/10/96 0543 11/09/13 0630 11/10/13 0605  NA 137 139 134*  K 3.5* 3.0* 3.3*  CL 96 95* 92*  CO2 25 26 25   GLUCOSE 103* 87 88  BUN 13 14 15   CREATININE 1.12 1.18 1.31  CALCIUM 8.5 8.4 8.4  MG 1.6 1.1* 1.4*  PHOS 1.6* 1.8* 1.9*   Liver Function Tests:  Recent Labs  11/08/13 0545 11/09/13 0630 11/10/13 0605  AST 155* 213* 279*  ALT 111* 111* 129*  ALKPHOS 257* 288* 307*  BILITOT 5.8* 6.6* 7.6*  PROT 4.6*  4.6* 4.9*  ALBUMIN 2.0* 1.8* 1.7*    Recent Labs  11/02/13 0400 11/03/13 1654 11/05/13 0530  AMMONIA 39 62* 52   CBC:  Recent Labs  11/07/13 0444 11/09/13 0630 11/10/13 0605  WBC 6.7 10.5 11.5*  HGB 10.4* 10.7* 10.8*  HCT 31.7* 32.1* 32.5*  MCV 102.6* 102.9* 101.6*  PLT 155 172 178   Cardiac  Enzymes:  Recent Labs  11/01/13 1321  TROPONINI <0.30   CBG:  Recent Labs  11/09/13 2256 11/10/13 0707 11/10/13 1220  GLUCAP 103* 80 137*    Transthoracic Echocardiography  Patient:    Ivaan, Liddy MR #:       [id number] Study Date: 11/02/2013 Gender:     M Age:        46 Height:     180.3 cm Weight:     108.9 kg BSA:        2.37 m^2 Pt. Status: Room:       1233   ADMITTING    Kathlen Mody [id number]  SONOGRAPHER  [address], [place]CS  ATTENDING    [employer] [facility]  PERFORMING   [facility], Inpatient  cc:  ------------------------------------------------------------------- LV EF: 65% -   70%  ------------------------------------------------------------------- Indications:      Respiratory Failure acute 518.81.  ------------------------------------------------------------------- History:   Risk factors:  Hypertension. Diabetes mellitus.  ------------------------------------------------------------------- Study Conclusions  - Left ventricle: The cavity size was normal. There was mild   concentric hypertrophy. Systolic function was vigorous. The   estimated ejection fraction was in the range of 65% to 70%. Wall   motion was normal; there were no regional wall motion   abnormalities. Doppler parameters are consistent with abnormal   left ventricular relaxation (grade 1 diastolic dysfunction). - Aortic root: The aortic root was normal in size. - Mitral valve: Calcified annulus. There was trivial regurgitation. - Right ventricle: Systolic function was normal. - Right atrium: The atrium was normal in size. - Tricuspid valve: There was trivial regurgitation. - Pulmonic valve: There was no regurgitation. - Pulmonary arteries: Systolic pressure was within the normal   range. - Pericardium, extracardiac: There was no pericardial effusion.  Impressions:  - Normal biventricular size and function. Impaired relaxation. Mild   LVH. No  significant valvular abnormality. Posterior mitral   annular calcifications.  Transthoracic echocardiography.  M-mode, complete 2D, spectral Doppler, and color Doppler.  Birthdate:  Patient birthdate: 1955/12/15.  Age:  Patient is 58 yr old.  Sex:  Gender: male. Height:  Height: 180.3 cm. Height: 71 in.  Weight:  Weight: 108.9 kg. Weight: 239.6 lb.  Body mass index:  BMI: 33.5 kg/m^2.  Body surface area:    BSA: 2.37 m^2.  Blood pressure:     83/59  Patient status:  Inpatient.  Study date:  Study date: 11/02/2013. Study time: 08:15 AM.  Location:  ICU/CCU  -------------------------------------------------------------------  ------------------------------------------------------------------- Left ventricle:  The cavity size was normal. There was mild concentric hypertrophy. Systolic function was vigorous. The estimated ejection fraction was in the range of 65% to 70%. Wall motion was normal; there were no regional wall motion abnormalities. Doppler parameters are consistent with abnormal left ventricular relaxation (grade 1 diastolic dysfunction). There was no evidence of elevated ventricular filling pressure by Doppler parameters.  ------------------------------------------------------------------- Aortic valve:   Trileaflet; normal thickness leaflets. Mobility was not restricted.  Doppler:  Transvalvular velocity was within the normal range. There was no stenosis. There was no regurgitation.   -------------------------------------------------------------------  Aorta:  Aortic root: The aortic root was normal in size.  ------------------------------------------------------------------- Mitral valve:   Calcified annulus. Mobility was not restricted. Doppler:  Transvalvular velocity was within the normal range. There was no evidence for stenosis. There was trivial regurgitation.  ------------------------------------------------------------------- Left atrium:  The atrium was normal  in size.  ------------------------------------------------------------------- Right ventricle:  The cavity size was normal. Wall thickness was normal. Systolic function was normal.  ------------------------------------------------------------------- Pulmonic valve:    Structurally normal valve.   Cusp separation was normal.  Doppler:  Transvalvular velocity was within the normal range. There was no evidence for stenosis. There was no regurgitation.  ------------------------------------------------------------------- Tricuspid valve:   Structurally normal valve.    Doppler: Transvalvular velocity was within the normal range. There was trivial regurgitation.  ------------------------------------------------------------------- Pulmonary artery:   The main pulmonary artery was normal-sized. Systolic pressure was within the normal range.  ------------------------------------------------------------------- Right atrium:  The atrium was normal in size.  ------------------------------------------------------------------- Pericardium:  There was no pericardial effusion.  ------------------------------------------------------------------- Systemic veins: Inferior vena cava: The vessel was normal in size.  ------------------------------------------------------------------- Prepared and Electronically Authenticated by  Tobias Alexander, M.D. 2015-06-30T12:54:57  ------------------------------------------------------------------- Measurements   Left ventricle                         Value        Reference  LV ID, ED, PLAX chordal        (N)     44.6  mm     43 - 52  LV ID, ES, PLAX chordal        (N)     26.5  mm     23 - 38  LV fx shortening, PLAX chordal (N)     41    %      >=29  LV PW thickness, ED                    12    mm     ---------  IVS/LV PW ratio, ED            (N)     1.03         <=1.3  LV e&', lateral                         6.2   cm/s   ---------  LV E/e&', lateral                        9.39         ---------  LV e&', medial                          5.33  cm/s   ---------  LV E/e&', medial                        10.92        ---------  LV e&', average                         5.77  cm/s   ---------  LV E/e&', average                       10.1         ---------    Ventricular septum  Value        Reference  IVS thickness, ED                      12.4  mm     ---------    Aorta                                  Value        Reference  Aortic root ID, ED                     30    mm     ---------    Left atrium                            Value        Reference  LA ID, A-P, ES                         36    mm     ---------  LA ID/bsa, A-P                 (N)     1.52  cm/m^2 <=2.2    Mitral valve                           Value        Reference  Mitral E-wave peak velocity            58.2  cm/s   ---------  Mitral A-wave peak velocity            68.9  cm/s   ---------  Mitral deceleration time       (N)     194   ms     150 - 230  Mitral E/A ratio, peak                 0.8          ---------    Systemic veins                         Value        Reference  Estimated CVP                          0     mm Hg  ---------    Right ventricle                        Value        Reference  RV s&', lateral, S                      13.5  cm/s   ---------  ABDOMEN - 1 VIEW   COMPARISON:  None.   FINDINGS: Orogastric tube extends into the esophagus with the tip located in the chest at the juncture of the mid and distal esophagus. Vague soft tissue density is seen in the abdomen with no evidence of bowel obstruction or significant ileus. There may be underlying ascites.   IMPRESSION: Orogastric tube tip lies in the esophagus at the juncture of the mid and distal thirds. Possible ascites.   ULTRASOUND  ABDOMEN COMPLETE   COMPARISON:  03/02/2008.   FINDINGS: Gallbladder:   No shadowing gallstones or echogenic sludge. No gallbladder  wall thickening or pericholecystic fluid. Negative sonographic Murphy sign according to the ultrasound technologist.   Common bile duct:   Diameter: Approximately 8 mm centrally.   Liver:   Diffusely increased and coarsened echotexture without focal hepatic parenchymal abnormality. Relatively enlarged left lobe relative to the right lobe, with subtle irregularity in the hepatic contour. Patent portal vein with hepatopetal flow.   IVC:   Patent in its intrahepatic portion. Not visualized outside of the liver.   Pancreas:   Normal-appearing head and body. Tail obscured by overlying bowel gas.   Spleen:   Normal size and echotexture without focal parenchymal abnormality.   Right Kidney:   Length: Approximately 11.0 cm. No hydronephrosis. Well-preserved cortex. No shadowing calculi. Normal parenchymal echotexture without focal abnormalities.   Left Kidney:   Length: Approximately 11.0 cm. No hydronephrosis. Well-preserved cortex. No shadowing calculi. Normal parenchymal echotexture without focal abnormalities.   Abdominal aorta:   Normal in caliber throughout its visualized course in the abdomen with evidence of atherosclerosis. Obscured distally by overlying bowel gas. Maximum diameter 2.3 cm.   Other findings:   None.   IMPRESSION: 1. Diffuse hepatic steatosis without focal hepatic parenchymal abnormality. Sonographic findings consistent with hepatic cirrhosis. 2. Patent portal vein with hepatopetal flow. 3. Otherwise normal examination with a caveat that the extrahepatic IVC, the pancreatic tail, and the distal abdominal aorta were obscured by bowel gas or therefore not evaluated.     PORTABLE CHEST - 1 VIEW   COMPARISON:  November 02, 2013   FINDINGS: Endotracheal tube and nasogastric tube have been removed. Central catheter tip is in the superior vena cava. No pneumothorax. There is atelectatic change in both lung bases, more on the left than on  the right. Elsewhere lungs are clear. Heart size and pulmonary vascularity are normal. No adenopathy.   IMPRESSION: Bilateral lower lobe atelectatic change, slightly more on the left than on the right. No pneumothorax. No edema or consolidation.   CT ABDOMEN AND PELVIS WITHOUT CONTRAST   TECHNIQUE: Multidetector CT imaging of the abdomen and pelvis was performed following the standard protocol without IV contrast.   COMPARISON:  None.   FINDINGS: Mild areas of linear density within the lung bases. Trace bilateral effusions right greater than left.   Diffuse low attenuation throughout the liver.   The spleen, adrenals, kidneys are unremarkable.   There is mild inflammatory change within the peripancreatic fat, loss of the normal undulating border of pancreas and mild pancreatic edema. No associated peripancreatic loculated fluid collections. Trace amount of peripancreatic free fluid. Dense bile within the gallbladder. Gallbladder fossa otherwise unremarkable.   Postsurgical changes appreciated involving the stomach which appear to represent prior gastric bypass surgery. Noncontrast evaluation of the bowel otherwise negative. There no aggressive appearing osseous lesions. Areas of spondylosis within the lower lumbar spine.   There is no abdominal aortic aneurysm. Within the limitations of a noncontrast CT no abdominal or pelvic masses or adenopathy.   No abdominal wall hernia.   Left inguinal hernia containing fat and fluid, small   IMPRESSION: CT consistent with pancreatitis. Correlation with pancreatic function tests recommended no evidence of a pancreatic pseudocyst.   Hepatic steatosis.    ASSESSMENT/PLAN:  Alcoholic cirrhosis-compensated Hypertension-well-controlled Diabetes mellitus-diet controlled Elevated liver functions-secondary to cirrhosis. 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 Continue supplementation.  Check CBC and CMP  I spoke with patient's wife and discussed above management plan.  I have reviewed patient's medical records received at admission/from hospitalization.  CPT CODE: 16109  Angela Cox, MD [facility] [phone]

## 2013-11-18 ENCOUNTER — Non-Acute Institutional Stay (SKILLED_NURSING_FACILITY): Payer: Medicare Other | Admitting: Internal Medicine

## 2013-11-18 DIAGNOSIS — R1013 Epigastric pain: Secondary | ICD-10-CM

## 2013-11-18 NOTE — Progress Notes (Signed)
         PROGRESS NOTE  DATE: 11/18/2013  FACILITY:  [facility]  LEVEL OF CARE: SNF (31)  Acute Visit  CHIEF COMPLAINT:  Manage abdominal pain and hypomagnesemia  HISTORY OF PRESENT ILLNESS: I was requested by the staff to assess the patient regarding above problem(s):  HYPOMAGNESEMIA: New problem. On 11-17-13 magnesium level 0.9. Patient is not on supplementation. He denies any neuromuscular symptoms.  ABDOMINAL PAIN: Patient is complaining of abdominal pain after eating which resolves by itself as well. He denies any other associated signs and symptoms. He does not want regular pain medications. He has alcoholic cirrhosis.  PAST MEDICAL HISTORY : Reviewed.  No changes/see problem list  CURRENT MEDICATIONS: Reviewed per MAR/see medication list  REVIEW OF SYSTEMS:  GENERAL: no change in appetite, no fatigue, no weight changes, no fever, chills or weakness RESPIRATORY: no cough, SOB, DOE,, wheezing, hemoptysis CARDIAC: no chest pain, or palpitations, complains of chronic lower extremity swelling. GI: no abdominal pain, diarrhea, constipation, heart burn, nausea or vomiting  PHYSICAL EXAMINATION  VS: see VS section  GENERAL: no acute distress, morbidly obese body habitus NECK: supple, trachea midline, no neck masses, no thyroid tenderness, no thyromegaly RESPIRATORY: breathing is even & unlabored, BS CTAB CARDIAC: RRR, no murmur,no extra heart sounds, +2 bilateral lower extremity edema GI: abdomen soft, no BS, no masses, no tenderness, no hepatomegaly, no splenomegaly, distended PSYCHIATRIC: the patient is alert & oriented to person, affect & behavior appropriate  LABS/RADIOLOGY: See history of present illness  ASSESSMENT/PLAN:  Hypomagnesemia-new problem. Start magnesium oxide 500 mg twice a day. Check magnesium level in one week. Abdominal pain-occurs postprandially, but relieves by its self. No need for pain medications.  CPT CODE: [id number]  Angela CoxGayani Y  Dasanayaka, MD [facility] [phone]

## 2013-11-26 ENCOUNTER — Non-Acute Institutional Stay (SKILLED_NURSING_FACILITY): Payer: Medicare Other | Admitting: Adult Health

## 2013-11-26 ENCOUNTER — Encounter: Payer: Self-pay | Admitting: Adult Health

## 2013-11-26 NOTE — Progress Notes (Addendum)
Patient ID: [name] MilchDavid C Prieto, male   DOB: [birth year], [age] y.o.   MRN: [id number]              PROGRESS NOTE  DATE: 11/26/13  [facility]  LEVEL OF CARE: SNF (31)  Acute Visit  CHIEF COMPLAINT:  Manage Hypomagnesemia  HISTORY OF PRESENT ILLNESS: This is a 58 year old male who was noted to have Mg 1.2 - low .  He complains of abdominal pains that relieves itself.  PAST MEDICAL HISTORY : Reviewed.  No changes/see problem list  CURRENT MEDICATIONS: Reviewed per MAR/see medication list  REVIEW OF SYSTEMS:  GENERAL:  no fever, chills  RESPIRATORY: SOB, DOE, wheezing, hemoptysis CARDIAC: no chest pain, or palpitations, + edema GI: constipation, heart burn, nausea or vomiting  PHYSICAL EXAMINATION  GENERAL: no acute distress, morbidly obese, +anasarca SKIN: slightly jaundice NECK: supple, trachea midline, no neck masses, no thyroid tenderness, no thyromegaly RESPIRATORY: breathing is even & unlabored, BS CTAB CARDIAC: RRR, no murmur,no extra heart sounds,  GI: abdomen soft, normal BS, +ascites EXTREMITIES:  Able to move all 4 extremities PSYCHIATRIC: the patient is alert & oriented to person, affect & behavior appropriate  LABS/RADIOLOGY: 11/25/13  Mg 1.2 11/24/13  sodium 135 potassium 2.9 glucose 75 BUN 22 creatinine 1.7 calcium 7.4 total protein 4.0 albumin 1.6  Glob 2.4 TBIL 26.3  ALP 310 AST 357 ALT 213 11/17/13  Mg 0.9 Labs reviewed: Basic Metabolic Panel:  Recent Labs  40/98/1105/10/18 0543 11/09/13 0630 11/10/13 0605  NA 137 139 134*  K 3.5* 3.0* 3.3*  CL 96 95* 92*  CO2 25 26 25   GLUCOSE 103* 87 88  BUN 13 14 15   CREATININE 1.12 1.18 1.31  CALCIUM 8.5 8.4 8.4  MG 1.6 1.1* 1.4*  PHOS 1.6* 1.8* 1.9*   Liver Function Tests:  Recent Labs  11/08/13 0545 11/09/13 0630 11/10/13 0605  AST 155* 213* 279*  ALT 111* 111* 129*  ALKPHOS 257* 288* 307*  BILITOT 5.8* 6.6* 7.6*  PROT 4.6* 4.6* 4.9*  ALBUMIN 2.0* 1.8* 1.7*     Recent Labs  11/02/13 0400 11/03/13 1654 11/05/13 0530  AMMONIA 39 62* 52   CBC:  Recent Labs  11/07/13 0444 11/09/13 0630 11/10/13 0605  WBC 6.7 10.5 11.5*  HGB 10.4* 10.7* 10.8*  HCT 31.7* 32.1* 32.5*  MCV 102.6* 102.9* 101.6*  PLT 155 172 178    Cardiac Enzymes:  Recent Labs  11/01/13 1321  TROPONINI <0.30   CBG:  Recent Labs  11/09/13 2256 11/10/13 0707 11/10/13 1220  GLUCAP 103* 80 137*    EXAM: ABDOMEN - 1 VIEW   COMPARISON:  None.   FINDINGS: Orogastric tube extends into the esophagus with the tip located in the chest at the juncture of the mid and distal esophagus. Vague soft tissue density is seen in the abdomen with no evidence of bowel obstruction or significant ileus. There may be underlying ascites.   IMPRESSION: Orogastric tube tip lies in the esophagus at the juncture of the mid and distal thirds. Possible ascites. EXAM: ULTRASOUND ABDOMEN COMPLETE   COMPARISON:  03/02/2008.   FINDINGS: Gallbladder:   No shadowing gallstones or echogenic sludge. No gallbladder wall thickening or pericholecystic fluid. Negative sonographic Murphy sign according to the ultrasound technologist.   Common bile duct:   Diameter: Approximately 8 mm centrally.   Liver:   Diffusely increased and coarsened echotexture without focal hepatic parenchymal abnormality. Relatively enlarged left lobe relative to the right  lobe, with subtle irregularity in the hepatic contour. Patent portal vein with hepatopetal flow.   IVC:   Patent in its intrahepatic portion. Not visualized outside of the liver.   Pancreas:   Normal-appearing head and body. Tail obscured by overlying bowel gas.   Spleen:   Normal size and echotexture without focal parenchymal abnormality.   Right Kidney:   Length: Approximately 11.0 cm. No hydronephrosis. Well-preserved cortex. No shadowing calculi. Normal parenchymal echotexture without focal abnormalities.   Left  Kidney:   Length: Approximately 11.0 cm. No hydronephrosis. Well-preserved cortex. No shadowing calculi. Normal parenchymal echotexture without focal abnormalities.   Abdominal aorta:   Normal in caliber throughout its visualized course in the abdomen with evidence of atherosclerosis. Obscured distally by overlying bowel gas. Maximum diameter 2.3 cm.   Other findings:   None.   IMPRESSION: 1. Diffuse hepatic steatosis without focal hepatic parenchymal abnormality. Sonographic findings consistent with hepatic cirrhosis. 2. Patent portal vein with hepatopetal flow. 3. Otherwise normal examination with a caveat that the extrahepatic IVC, the pancreatic tail, and the distal abdominal aorta were obscured by bowel gas or therefore not evaluated.   EXAM: PORTABLE CHEST - 1 VIEW   COMPARISON:  November 02, 2013   FINDINGS: Endotracheal tube and nasogastric tube have been removed. Central catheter tip is in the superior vena cava. No pneumothorax. There is atelectatic change in both lung bases, more on the left than on the right. Elsewhere lungs are clear. Heart size and pulmonary vascularity are normal. No adenopathy.   IMPRESSION: Bilateral lower lobe atelectatic change, slightly more on the left than on the right. No pneumothorax. No edema or consolidation. EXAM: CT ABDOMEN AND PELVIS WITHOUT CONTRAST   TECHNIQUE: Multidetector CT imaging of the abdomen and pelvis was performed following the standard protocol without IV contrast.   COMPARISON:  None.   FINDINGS: Mild areas of linear density within the lung bases. Trace bilateral effusions right greater than left.   Diffuse low attenuation throughout the liver.   The spleen, adrenals, kidneys are unremarkable.   There is mild inflammatory change within the peripancreatic fat, loss of the normal undulating border of pancreas and mild pancreatic edema. No associated peripancreatic loculated fluid collections. Trace  amount of peripancreatic free fluid. Dense bile within the gallbladder. Gallbladder fossa otherwise unremarkable.   Postsurgical changes appreciated involving the stomach which appear to represent prior gastric bypass surgery. Noncontrast evaluation of the bowel otherwise negative. There no aggressive appearing osseous lesions. Areas of spondylosis within the lower lumbar spine.   There is no abdominal aortic aneurysm. Within the limitations of a noncontrast CT no abdominal or pelvic masses or adenopathy.   No abdominal wall hernia.   Left inguinal hernia containing fat and fluid, small   IMPRESSION: CT consistent with pancreatitis. Correlation with pancreatic function tests recommended no evidence of a pancreatic pseudocyst.   Hepatic steatosis.    ASSESSMENT/PLAN:  Hypomagnesemia - increase Magnesium Oxide 500 mg PO TID; check Mg level in 1 week  CPT CODE: 10272  Ella Bodo - NP [facility] [phone]

## 2013-12-01 ENCOUNTER — Encounter: Payer: Self-pay | Admitting: Adult Health

## 2013-12-01 ENCOUNTER — Non-Acute Institutional Stay (SKILLED_NURSING_FACILITY): Payer: Medicare Other | Admitting: Adult Health

## 2013-12-01 DIAGNOSIS — E876 Hypokalemia: Secondary | ICD-10-CM

## 2013-12-01 DIAGNOSIS — R609 Edema, unspecified: Secondary | ICD-10-CM

## 2013-12-01 DIAGNOSIS — F329 Major depressive disorder, single episode, unspecified: Secondary | ICD-10-CM

## 2013-12-01 DIAGNOSIS — E119 Type 2 diabetes mellitus without complications: Secondary | ICD-10-CM

## 2013-12-01 DIAGNOSIS — K7290 Hepatic failure, unspecified without coma: Secondary | ICD-10-CM

## 2013-12-01 DIAGNOSIS — F10239 Alcohol dependence with withdrawal, unspecified: Secondary | ICD-10-CM

## 2013-12-01 DIAGNOSIS — F10230 Alcohol dependence with withdrawal, uncomplicated: Secondary | ICD-10-CM

## 2013-12-01 DIAGNOSIS — K7031 Alcoholic cirrhosis of liver with ascites: Secondary | ICD-10-CM

## 2013-12-01 DIAGNOSIS — D638 Anemia in other chronic diseases classified elsewhere: Secondary | ICD-10-CM

## 2013-12-01 DIAGNOSIS — K7030 Alcoholic cirrhosis of liver without ascites: Secondary | ICD-10-CM

## 2013-12-01 DIAGNOSIS — F3289 Other specified depressive episodes: Secondary | ICD-10-CM

## 2013-12-01 DIAGNOSIS — K7682 Hepatic encephalopathy: Secondary | ICD-10-CM

## 2013-12-01 DIAGNOSIS — I959 Hypotension, unspecified: Secondary | ICD-10-CM

## 2013-12-01 DIAGNOSIS — F10930 Alcohol use, unspecified with withdrawal, uncomplicated: Secondary | ICD-10-CM

## 2013-12-01 DIAGNOSIS — R601 Generalized edema: Secondary | ICD-10-CM

## 2013-12-01 DIAGNOSIS — F10939 Alcohol use, unspecified with withdrawal, unspecified: Secondary | ICD-10-CM

## 2013-12-01 NOTE — Progress Notes (Addendum)
Patient ID: [name], male   DOB: February 13, 1956, 58 y.o.   MRN: [id number]              PROGRESS NOTE  DATE:  12/01/13  FACILITY: Nursing Home Location: [facility]  LEVEL OF CARE: SNF (31)  Acute Visit  CHIEF COMPLAINT:  Discharge Notes  HISTORY OF PRESENT ILLNESS: This is a 58 year old male who is for discharge home with Home health PT, OT, Nursing and CNA. DME: Hospital bed and bedside commode. He has been admitted to [facility] on 11/10/13 from [facility] Encephalopathy, Anasarca, ETOH withdrawal and Liver cirrhosis. Patient was admitted to this facility for short-term rehabilitation after the patient's recent hospitalization.  Patient has completed SNF rehabilitation and therapy has cleared the patient for discharge.   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  Pt is tolerating current medications without any adverse reactions.  No dizziness or lightheadedness reported.  Last BP 106/73  HYPOMAGNESEMIA: The patient is noted to have tremors. The patient denies any tetany and convulsions.  No complications reported from the current magnesium supplementation being used. Recently increased magnesium dosage 7/15 Mg 1.2  HYPOKALEMIA: The patient's hypokalemia remains stable. Patient denies muscle cramping or palpitations. No complications reported from current potassium supplementation. Recently increased K dosage. 7/15 K 2.9   PAST MEDICAL HISTORY : Reviewed.  No changes/see problem list  CURRENT MEDICATIONS: Reviewed per MAR/see medication list  REVIEW OF SYSTEMS:  GENERAL:  no fatigue, no feveror chills  RESPIRATORY: no SOB, DOE, wheezing, hemoptysis CARDIAC: no chest pain, or palpitations, + edema GI: no constipation, heart burn, nausea or vomiting, +diarrhea (patient on Lactulose)  PHYSICAL EXAMINATION  GENERAL: no acute distress, morbidly obese, +anasarca SKIN: slightly  jaundice EYES: conjunctivae slightly icteric, sclerae normal, normal eye lids NECK: supple, trachea midline, no neck masses, no thyroid tenderness, no thyromegaly RESPIRATORY: breathing is even & unlabored, BS CTAB CARDIAC: RRR, no murmur,no extra heart sounds GI: abdomen soft, normal BS, + ascites EXTREMITIES:  Able to move all 4 extremities PSYCHIATRIC: the patient is alert & oriented to person, affect & behavior appropriate  LABS/RADIOLOGY: 11/25/13  magnesium 1.2 sodium 135 potassium 2.9 glucose 75 BUN 22 creatinine 1.7 calcium 7.4 total protein 4.0 albumin 1.6 globulin 2.4 total bilirubin 26.3 AST 357 ALT 213 ALP 310 11/17/13  magnesium 0.9 phosphorous 2.9 11/16/13  sodium 134 potassium 2.7 glucose 75 BUN 13 creatinine 1.3 calcium 7.8 total protein 3.8 albumin 1.6 globulin 2.2 total bilirubin 17.9 ALP 299 AST 505 ALT 206; stool (-) for C-Difficile toxin vitamin B12 >1500 folate 896 WBC 8.4 hemoglobin 10.1 hematocrit 30.3 Labs reviewed: Basic Metabolic Panel:  Recent Labs  40/98/11 0543 11/09/13 0630 11/10/13 0605  NA 137 139 134*  K 3.5* 3.0* 3.3*  CL 96 95* 92*  CO2 25 26 25   GLUCOSE 103* 87 88  BUN 13 14 15   CREATININE 1.12 1.18 1.31  CALCIUM 8.5 8.4 8.4  MG 1.6 1.1* 1.4*  PHOS 1.6* 1.8* 1.9*   Liver Function Tests:  Recent Labs  11/08/13 0545 11/09/13 0630 11/10/13 0605  AST 155* 213* 279*  ALT 111* 111* 129*  ALKPHOS 257* 288* 307*  BILITOT 5.8* 6.6* 7.6*  PROT 4.6* 4.6* 4.9*  ALBUMIN 2.0* 1.8* 1.7*    Recent Labs  11/02/13 0400 11/03/13 1654 11/05/13 0530  AMMONIA 39 62* 52   CBC:  Recent Labs  11/07/13 0444 11/09/13 0630 11/10/13 0605  WBC 6.7 10.5  11.5*  HGB 10.4* 10.7* 10.8*  HCT 31.7* 32.1* 32.5*  MCV 102.6* 102.9* 101.6*  PLT 155 172 178    Cardiac Enzymes:  Recent Labs  11/01/13 1321  TROPONINI <0.30   CBG:  Recent Labs  11/09/13 2256 11/10/13 0707 11/10/13 1220  GLUCAP 103* 80 137*    EXAM: ABDOMEN - 1 VIEW    COMPARISON:  None.   FINDINGS: Orogastric tube extends into the esophagus with the tip located in the chest at the juncture of the mid and distal esophagus. Vague soft tissue density is seen in the abdomen with no evidence of bowel obstruction or significant ileus. There may be underlying ascites.   IMPRESSION: Orogastric tube tip lies in the esophagus at the juncture of the mid and distal thirds. Possible ascites. EXAM: ULTRASOUND ABDOMEN COMPLETE   COMPARISON:  03/02/2008.   FINDINGS: Gallbladder:   No shadowing gallstones or echogenic sludge. No gallbladder wall thickening or pericholecystic fluid. Negative sonographic Murphy sign according to the ultrasound technologist.   Common bile duct:   Diameter: Approximately 8 mm centrally.   Liver:   Diffusely increased and coarsened echotexture without focal hepatic parenchymal abnormality. Relatively enlarged left lobe relative to the right lobe, with subtle irregularity in the hepatic contour. Patent portal vein with hepatopetal flow.   IVC:   Patent in its intrahepatic portion. Not visualized outside of the liver.   Pancreas:   Normal-appearing head and body. Tail obscured by overlying bowel gas.   Spleen:   Normal size and echotexture without focal parenchymal abnormality.   Right Kidney:   Length: Approximately 11.0 cm. No hydronephrosis. Well-preserved cortex. No shadowing calculi. Normal parenchymal echotexture without focal abnormalities.   Left Kidney:   Length: Approximately 11.0 cm. No hydronephrosis. Well-preserved cortex. No shadowing calculi. Normal parenchymal echotexture without focal abnormalities.   Abdominal aorta:   Normal in caliber throughout its visualized course in the abdomen with evidence of atherosclerosis. Obscured distally by overlying bowel gas. Maximum diameter 2.3 cm.   Other findings:   None.   IMPRESSION: 1. Diffuse hepatic steatosis without focal hepatic  parenchymal abnormality. Sonographic findings consistent with hepatic cirrhosis. 2. Patent portal vein with hepatopetal flow. 3. Otherwise normal examination with a caveat that the extrahepatic IVC, the pancreatic tail, and the distal abdominal aorta were obscured by bowel gas or therefore not evaluated.   EXAM: PORTABLE CHEST - 1 VIEW   COMPARISON:  November 02, 2013   FINDINGS: Endotracheal tube and nasogastric tube have been removed. Central catheter tip is in the superior vena cava. No pneumothorax. There is atelectatic change in both lung bases, more on the left than on the right. Elsewhere lungs are clear. Heart size and pulmonary vascularity are normal. No adenopathy.   IMPRESSION: Bilateral lower lobe atelectatic change, slightly more on the left than on the right. No pneumothorax. No edema or consolidation. EXAM: CT ABDOMEN AND PELVIS WITHOUT CONTRAST   TECHNIQUE: Multidetector CT imaging of the abdomen and pelvis was performed following the standard protocol without IV contrast.   COMPARISON:  None.   FINDINGS: Mild areas of linear density within the lung bases. Trace bilateral effusions right greater than left.   Diffuse low attenuation throughout the liver.   The spleen, adrenals, kidneys are unremarkable.   There is mild inflammatory change within the peripancreatic fat, loss of the normal undulating border of pancreas and mild pancreatic edema. No associated peripancreatic loculated fluid collections. Trace amount of peripancreatic free fluid. Dense bile within the gallbladder. Gallbladder  fossa otherwise unremarkable.   Postsurgical changes appreciated involving the stomach which appear to represent prior gastric bypass surgery. Noncontrast evaluation of the bowel otherwise negative. There no aggressive appearing osseous lesions. Areas of spondylosis within the lower lumbar spine.   There is no abdominal aortic aneurysm. Within the limitations of  a noncontrast CT no abdominal or pelvic masses or adenopathy.   No abdominal wall hernia.   Left inguinal hernia containing fat and fluid, small   IMPRESSION: CT consistent with pancreatitis. Correlation with pancreatic function tests recommended no evidence of a pancreatic pseudocyst.   Hepatic steatosis.    ASSESSMENT/PLAN:  Encephalopathy - improving; continue Lactulose Anasarca - continue Lasix and Spironolactone ETOH Withdrawal - continue Ativan PRN Depression - continue Wellbutrin and Prozac Diabetes mellitus, type II - check CBG daily Hypotension - continue Midodrine Liver cirrhosis -   Follow-up with GI Anemia of chronic disease - stable Hypomagnesemia - continue supplementation Hypokalemia - continue supplementation  I have filled out patient's discharge paperwork and written prescriptions.  Patient will receive home health PT, OT, Nursing and CNA.  DME provided:  Hospital bed and bedside commode  Total discharge time: Greater than 30 minutes  Discharge time involved coordination of the discharge process with social worker, nursing staff and therapy department. Medical justification for home health services/DME verified.    CPT CODE: [id number]  Ella BodoMonina Vargas - NP [facility] [phone]

## 2014-01-04 DEATH — deceased

## 2014-12-21 ENCOUNTER — Encounter: Payer: Self-pay | Admitting: Gastroenterology

## 2020-03-06 DEATH — deceased
